# Patient Record
Sex: Female | Born: 1940 | Race: White | Hispanic: No | Marital: Married | Smoking: Never smoker
Health system: Southern US, Community
[De-identification: ages and names within clinical notes are randomized; demographics above are authoritative.]

## PROBLEM LIST (undated history)

## (undated) DIAGNOSIS — R32 Unspecified urinary incontinence: Secondary | ICD-10-CM

## (undated) DIAGNOSIS — E7800 Pure hypercholesterolemia, unspecified: Secondary | ICD-10-CM

## (undated) DIAGNOSIS — M949 Disorder of cartilage, unspecified: Secondary | ICD-10-CM

## (undated) DIAGNOSIS — M899 Disorder of bone, unspecified: Secondary | ICD-10-CM

## (undated) DIAGNOSIS — C4440 Unspecified malignant neoplasm of skin of scalp and neck: Secondary | ICD-10-CM

## (undated) DIAGNOSIS — R002 Palpitations: Secondary | ICD-10-CM

## (undated) DIAGNOSIS — E870 Hyperosmolality and hypernatremia: Secondary | ICD-10-CM

## (undated) DIAGNOSIS — Z79899 Other long term (current) drug therapy: Secondary | ICD-10-CM

## (undated) DIAGNOSIS — M67919 Unspecified disorder of synovium and tendon, unspecified shoulder: Secondary | ICD-10-CM

## (undated) DIAGNOSIS — M810 Age-related osteoporosis without current pathological fracture: Secondary | ICD-10-CM

## (undated) DIAGNOSIS — M25519 Pain in unspecified shoulder: Secondary | ICD-10-CM

## (undated) DIAGNOSIS — K2100 Gastro-esophageal reflux disease with esophagitis, without bleeding: Secondary | ICD-10-CM

## (undated) DIAGNOSIS — M719 Bursopathy, unspecified: Secondary | ICD-10-CM

## (undated) DIAGNOSIS — M79609 Pain in unspecified limb: Secondary | ICD-10-CM

## (undated) DIAGNOSIS — K219 Gastro-esophageal reflux disease without esophagitis: Secondary | ICD-10-CM

## (undated) DIAGNOSIS — R000 Tachycardia, unspecified: Secondary | ICD-10-CM

## (undated) DIAGNOSIS — I10 Essential (primary) hypertension: Secondary | ICD-10-CM

## (undated) DIAGNOSIS — E785 Hyperlipidemia, unspecified: Secondary | ICD-10-CM

## (undated) HISTORY — DX: Disorder of bone, unspecified: M89.9

## (undated) HISTORY — DX: Unspecified urinary incontinence: R32

## (undated) HISTORY — DX: Pure hypercholesterolemia, unspecified: E78.00

## (undated) HISTORY — DX: Gastro-esophageal reflux disease with esophagitis, without bleeding: K21.00

## (undated) HISTORY — DX: Essential (primary) hypertension: I10

## (undated) HISTORY — PX: VARICOSE VEIN SURGERY: SHX832

## (undated) HISTORY — DX: Unspecified malignant neoplasm of skin of scalp and neck: C44.40

## (undated) HISTORY — DX: Bursopathy, unspecified: M71.9

## (undated) HISTORY — DX: Gastro-esophageal reflux disease without esophagitis: K21.9

## (undated) HISTORY — DX: Pain in unspecified shoulder: M25.519

## (undated) HISTORY — DX: Gastro-esophageal reflux disease with esophagitis: K21.0

## (undated) HISTORY — PX: DIAGNOSTIC MAMMOGRAM: HXRAD719

## (undated) HISTORY — DX: Palpitations: R00.2

## (undated) HISTORY — DX: Hyperosmolality and hypernatremia: E87.0

## (undated) HISTORY — DX: Hyperlipidemia, unspecified: E78.5

## (undated) HISTORY — DX: Age-related osteoporosis without current pathological fracture: M81.0

## (undated) HISTORY — DX: Pain in unspecified limb: M79.609

## (undated) HISTORY — DX: Unspecified disorder of synovium and tendon, unspecified shoulder: M67.919

## (undated) HISTORY — DX: Other long term (current) drug therapy: Z79.899

## (undated) HISTORY — DX: Disorder of cartilage, unspecified: M94.9

## (undated) HISTORY — DX: Tachycardia, unspecified: R00.0

---
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
## 2002-09-12 HISTORY — PX: CHOLECYSTECTOMY: SHX55

## 2003-04-26 ENCOUNTER — Encounter: Payer: Self-pay | Admitting: General Surgery

## 2003-04-26 ENCOUNTER — Observation Stay (HOSPITAL_COMMUNITY): Admission: RE | Admit: 2003-04-26 | Discharge: 2003-04-27 | Payer: Self-pay | Admitting: General Surgery

## 2003-04-26 ENCOUNTER — Encounter (INDEPENDENT_AMBULATORY_CARE_PROVIDER_SITE_OTHER): Payer: Self-pay | Admitting: Specialist

## 2007-01-15 ENCOUNTER — Other Ambulatory Visit: Admission: RE | Admit: 2007-01-15 | Discharge: 2007-01-15 | Payer: Self-pay | Admitting: Gynecology

## 2008-09-12 HISTORY — PX: CATARACT EXTRACTION: SUR2

## 2009-03-31 HISTORY — PX: COLONOSCOPY: SHX174

## 2011-01-28 NOTE — Op Note (Signed)
NAME:  Brooke Cox, Brooke Cox                          ACCOUNT NO.:  [id number]   MEDICAL RECORD NO.:  [id number]                   PATIENT TYPE:  OBV   LOCATION:  0106                                 FACILITY:  [facility]   PHYSICIAN:  Edsel Petrin. Dalbert Batman, M.D.             DATE OF BIRTH:  01-25-1941   DATE OF PROCEDURE:  04/26/2003  DATE OF DISCHARGE:                                 OPERATIVE REPORT   PREOPERATIVE DIAGNOSIS:  Acute cholecystitis with cholelithiasis   POSTOPERATIVE DIAGNOSIS:  1. Acute cholecystitis with cholelithiasis.  2. Liver nodule.   OPERATION/PROCEDURE:  1. Wedge biopsy of liver.  2. Laparoscopic cholecystectomy with intraoperative cholangiogram.   SURGEON:  Edsel Petrin. Dalbert Batman, M.D.   FIRST ASSISTANT:  Timothy E. Rosana Hoes, M.D.   OPERATIVE INDICATIONS:  This is a 70 year old white female who has been  having upper abdominal pain, right upper quadrant pain, nausea and vomiting  for about four or five days.  She was seen in the office yesterday and was  found to still have right upper quadrant tenderness although she was not  severely ill.  Ultrasound shows thickening of the gallbladder and multiple  gallstones.  She is brought to the operating room for cholecystectomy.   OPERATIVE FINDINGS:  1. The gallbladder was acutely inflamed and thick-walled. The cholangiogram     was normal showing normal intrahepatic and extrahepatic bile ducts.  No     filling defects and prompt flow of contrast in the duodenum.  There was a     whitish superficial nodule on the surface of the right lobe of the liver     and I essentially excised most of this in a wedge biopsy-type form with     laparoscopic instruments and sent this for routine histology.  We had     excellent hemostasis there.  The stomach and duodenum, large intestine     and small intestine were grossly normal to inspection.   DESCRIPTION OF PROCEDURE:  Following the induction of general endotracheal  anesthesia, the  patient's abdomen was prepped and draped in the sterile  fashion.  Marcaine 0.5% with epinephrine was used as a local infiltration  anesthetic.  A vertically oriented incision was made at the lower rim of the  umbilicus.  The fascia was incised in the midline and the abdominal cavity  entered under direct vision.  A 10 mm Hasson trocar was inserted and secured  with a pursestring of 0 Vicryl.  Pneumoperitoneum was created.  Video camera  was inserted with visualization and findings as described above.  A 10 mm  trocar was placed in the subxiphoid region and two 5 mm trocars were placed  in the right upper quadrant.   We took a curved biopsy forceps and with a biting biopsy technique excised  the small 8 mm nodule from the capsule of the surface of the right lobe of  the liver superiorly.  We cauterized this and we had excellent hemostasis.   We then used the suction trocar and evacuated the bile from the gallbladder  because it was very tense.  This facilitated the dissection.  We dissected  out the cystic duct and the cystic artery.  We cannulated the cystic duct  with the cholangiogram catheter.  The cholangiogram was obtained using the C-  arm.  This was a normal cholangiogram.  There were no filling defects.  There was no obstruction.  The intrahepatic and extrahepatic anatomy was  normal.  The cholangiogram catheter was removed and the cystic duct secured  with multiple metal clips and divided.  Cystic artery was isolated as it  went into the gallbladder wall, secured with multiple metal clips and  divided. The gallbladder was dissected from its bed with electrocautery,  placed in the specimen bag and removed.   We had moderate bleeding during the case but with the use of electrocautery  and Surgicel, everything was completely dry at the end of the case.  We  inspected the cystic artery and cystic duct closures and they were perfectly  secure.  After irrigating two or three liters  during the case and at the  end, the irrigation fluid was completely clear.  The surface of the liver  was completely hemostatic.  The trocars were removed under direct vision.  There was no bleeding from the trocar sites.  The pneumoperitoneum was  released.  The fascia at the umbilicus was closed with 0 Vicryl suture.  Skin incisions were closed with subcuticular sutures of 4-0 Vicryl and Steri-  Strips.  Kling bandages were placed.  The patient was taken to the recovery  room in stable condition.  Estimated blood loss was about 30-40 mL.  Complications none.  Sponge and needle counts were correct.                                               Edsel Petrin. Dalbert Batman, M.D.    HMI/MEDQ  D:  04/26/2003  T:  04/26/2003  Job:  [id number]   cc:   Viviann Spare. Nyoka Cowden, M.D.  [address]  [place]  [place] [address]  Fax: [phone]   Ezequiel Kayser, M.D.  Internal Medicine Resident - [place]  [place] [address]  Fax: [phone]

## 2011-01-28 NOTE — H&P (Signed)
NAME:  KENNEDY, BRINES                          ACCOUNT NO.:  [id number]   MEDICAL RECORD NO.:  [id number]                   PATIENT TYPE:  OBV   LOCATION:  0106                                 FACILITY:  [facility]   PHYSICIAN:  Edsel Petrin. Dalbert Batman, M.D.             DATE OF BIRTH:  13-Jul-1941   DATE OF ADMISSION:  04/26/2003  DATE OF DISCHARGE:                                HISTORY & PHYSICAL   CHIEF COMPLAINT:  Abdominal pain and gallstones.   HISTORY OF PRESENT ILLNESS:  This is a 70 year old white female who has been  having upper abdominal pain, nausea and vomiting off and on for about five  days.  After her second or third episode, she sought evaluation.  She was  not having any fever, change in her bowel habits or voiding problems.  She  has not had any prior episodes.   On Thursday, two days ago, she saw Dr. Ezequiel Kayser.  Gallbladder  ultrasound was performed at [facility], which showed gallstones  with thickening of the gallbladder wall.  Lab work was done showing a  hemoglobin of 12.8, white count of 10,500, normal liver function tests.  She  also had a normal amylase and lipase.  I saw her in the office yesterday;  she was still having some pain and right upper quadrant tenderness, although  she was not severely ill.  We decided to go ahead with surgery today.   PAST HISTORY:  Appendectomy in 1955.  History of a tachyarrhythmia for which  she takes beta blockers, otherwise, no medical problems.   CURRENT MEDICATIONS:  Atenolol 25 mg daily.   DRUG ALLERGIES:  Drug allergies -- none.  She is allergic to [facility].   SOCIAL HISTORY:  The patient is married and she has no children.  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  Denies the use of alcohol or  tobacco.   FAMILY HISTORY:  Father is deceased, had heart disease and a pacemaker;  mother deceased, had congestive heart failure; one brother living with  diabetes.   REVIEW OF SYSTEMS:  All  systems are reviewed.  They are documented in our  records and are noncontributory except as described above.   PHYSICAL EXAMINATION:  GENERAL:  A pleasant, older, middle-aged woman in no  distress.  She is relatively thin, appears quite fit for her age.  VITAL SIGNS:  Vital signs are listed on the chart.  She is afebrile.  EYES:  Sclerae are clear.  Extraocular movements intact.  EARS, NOSE, MOUTH AND THROAT:  Nose, lips, tongue and oropharynx without  gross lesions.  NECK:  Neck supple and nontender.  No thyroid mass.  No adenopathy.  No  bruit.  No jugular venous distention.  LUNGS:  Lungs clear to auscultation.  No CVA tenderness.  HEART:  Regular rate and rhythm.  No murmur or clicks or rubs.  ABDOMEN:  Abdomen  soft, not distended.  Tender with a little bit of guarding  in the right upper quadrant but no mass.  The liver and spleen do not appear  enlarged.  No herniae noted.  LYMPHATICS:  No enlarged lymph node in the neck or groins.  EXTREMITIES:  She moves all four extremities well without any deformity or  edema.  Peripheral pulses are intact.  NEUROLOGIC:  No gross motor or sensory deficits.   ASSESSMENT:  Subacute cholecystitis with cholelithiasis.   PLAN:  1. We will go ahead and proceed with laparoscopic cholecystectomy today.  2. I have discussed the indications and details of surgery with her.  Risks     and complications have been outlined, including but not limited to     bleeding; infection; conversion to open laparotomy; injury to adjacent     organs with major reconstructive surgery; cardiac, pulmonary and     thromboembolic complications; and other unforeseen problems.  She seems     to understand these issues well.  At this time, all of her questions are     answered.  She is in complete agreement with this plan.                                                  Edsel Petrin. Dalbert Batman, M.D.    HMI/MEDQ  D:  04/26/2003  T:  04/26/2003  Job:  [id number]   cc:    Viviann Spare. Nyoka Cowden, M.D.  [address]  [place]  [place] [address]  Fax: [place]

## 2012-01-12 DIAGNOSIS — L570 Actinic keratosis: Secondary | ICD-10-CM | POA: Diagnosis not present

## 2012-01-12 DIAGNOSIS — D485 Neoplasm of uncertain behavior of skin: Secondary | ICD-10-CM | POA: Diagnosis not present

## 2012-06-28 DIAGNOSIS — M899 Disorder of bone, unspecified: Secondary | ICD-10-CM | POA: Diagnosis not present

## 2012-06-28 DIAGNOSIS — E785 Hyperlipidemia, unspecified: Secondary | ICD-10-CM | POA: Diagnosis not present

## 2012-06-28 DIAGNOSIS — I10 Essential (primary) hypertension: Secondary | ICD-10-CM | POA: Diagnosis not present

## 2012-06-28 DIAGNOSIS — R002 Palpitations: Secondary | ICD-10-CM | POA: Diagnosis not present

## 2012-07-03 DIAGNOSIS — H6120 Impacted cerumen, unspecified ear: Secondary | ICD-10-CM | POA: Diagnosis not present

## 2012-07-03 DIAGNOSIS — I10 Essential (primary) hypertension: Secondary | ICD-10-CM | POA: Diagnosis not present

## 2012-07-03 DIAGNOSIS — Z23 Encounter for immunization: Secondary | ICD-10-CM | POA: Diagnosis not present

## 2012-07-03 DIAGNOSIS — R002 Palpitations: Secondary | ICD-10-CM | POA: Diagnosis not present

## 2012-07-11 DIAGNOSIS — J019 Acute sinusitis, unspecified: Secondary | ICD-10-CM | POA: Diagnosis not present

## 2012-07-30 DIAGNOSIS — H1010 Acute atopic conjunctivitis, unspecified eye: Secondary | ICD-10-CM | POA: Diagnosis not present

## 2012-07-30 DIAGNOSIS — J019 Acute sinusitis, unspecified: Secondary | ICD-10-CM | POA: Diagnosis not present

## 2012-08-14 DIAGNOSIS — R920 Mammographic microcalcification found on diagnostic imaging of breast: Secondary | ICD-10-CM | POA: Diagnosis not present

## 2012-08-14 DIAGNOSIS — Z09 Encounter for follow-up examination after completed treatment for conditions other than malignant neoplasm: Secondary | ICD-10-CM | POA: Diagnosis not present

## 2012-08-14 DIAGNOSIS — N63 Unspecified lump in unspecified breast: Secondary | ICD-10-CM | POA: Diagnosis not present

## 2012-08-14 DIAGNOSIS — M949 Disorder of cartilage, unspecified: Secondary | ICD-10-CM | POA: Diagnosis not present

## 2012-08-14 HISTORY — PX: OTHER SURGICAL HISTORY: SHX169

## 2012-08-14 LAB — HM MAMMOGRAPHY: HM Mammogram: ABNORMAL

## 2012-08-20 ENCOUNTER — Other Ambulatory Visit: Payer: Self-pay

## 2012-08-20 DIAGNOSIS — N63 Unspecified lump in unspecified breast: Secondary | ICD-10-CM | POA: Diagnosis not present

## 2012-08-20 DIAGNOSIS — R928 Other abnormal and inconclusive findings on diagnostic imaging of breast: Secondary | ICD-10-CM | POA: Diagnosis not present

## 2012-08-20 DIAGNOSIS — D249 Benign neoplasm of unspecified breast: Secondary | ICD-10-CM | POA: Diagnosis not present

## 2013-07-02 ENCOUNTER — Encounter: Payer: Self-pay | Admitting: *Deleted

## 2013-07-02 ENCOUNTER — Other Ambulatory Visit: Payer: Self-pay | Admitting: *Deleted

## 2013-07-02 ENCOUNTER — Other Ambulatory Visit: Payer: Medicare Other

## 2013-07-02 DIAGNOSIS — E870 Hyperosmolality and hypernatremia: Secondary | ICD-10-CM

## 2013-07-02 DIAGNOSIS — I10 Essential (primary) hypertension: Secondary | ICD-10-CM

## 2013-07-02 DIAGNOSIS — E785 Hyperlipidemia, unspecified: Secondary | ICD-10-CM

## 2013-07-03 ENCOUNTER — Ambulatory Visit (INDEPENDENT_AMBULATORY_CARE_PROVIDER_SITE_OTHER): Payer: Medicare Other | Admitting: Internal Medicine

## 2013-07-03 ENCOUNTER — Encounter: Payer: Self-pay | Admitting: Internal Medicine

## 2013-07-03 VITALS — BP 122/74 | HR 78 | Temp 97.9°F | Resp 12 | Ht 59.0 in | Wt 156.0 lb

## 2013-07-03 DIAGNOSIS — H6120 Impacted cerumen, unspecified ear: Secondary | ICD-10-CM

## 2013-07-03 DIAGNOSIS — I119 Hypertensive heart disease without heart failure: Secondary | ICD-10-CM | POA: Diagnosis not present

## 2013-07-03 DIAGNOSIS — Z124 Encounter for screening for malignant neoplasm of cervix: Secondary | ICD-10-CM | POA: Diagnosis not present

## 2013-07-03 DIAGNOSIS — M810 Age-related osteoporosis without current pathological fracture: Secondary | ICD-10-CM | POA: Diagnosis not present

## 2013-07-03 DIAGNOSIS — K219 Gastro-esophageal reflux disease without esophagitis: Secondary | ICD-10-CM | POA: Diagnosis not present

## 2013-07-03 DIAGNOSIS — M79604 Pain in right leg: Secondary | ICD-10-CM

## 2013-07-03 DIAGNOSIS — Z23 Encounter for immunization: Secondary | ICD-10-CM

## 2013-07-03 DIAGNOSIS — M79609 Pain in unspecified limb: Secondary | ICD-10-CM | POA: Diagnosis not present

## 2013-07-03 DIAGNOSIS — H6122 Impacted cerumen, left ear: Secondary | ICD-10-CM

## 2013-07-03 DIAGNOSIS — E785 Hyperlipidemia, unspecified: Secondary | ICD-10-CM

## 2013-07-03 DIAGNOSIS — Z0000 Encounter for general adult medical examination without abnormal findings: Secondary | ICD-10-CM

## 2013-07-03 LAB — COMPREHENSIVE METABOLIC PANEL
ALT: 10 IU/L (ref 0–32)
AST: 16 IU/L (ref 0–40)
Alkaline Phosphatase: 78 IU/L (ref 39–117)
CO2: 26 mmol/L (ref 18–29)
Calcium: 10 mg/dL (ref 8.6–10.2)
Chloride: 101 mmol/L (ref 97–108)
Globulin, Total: 2.3 g/dL (ref 1.5–4.5)
Glucose: 81 mg/dL (ref 65–99)
Potassium: 4.6 mmol/L (ref 3.5–5.2)
Sodium: 142 mmol/L (ref 134–144)
Total Protein: 6.4 g/dL (ref 6.0–8.5)

## 2013-07-03 LAB — CBC WITH DIFFERENTIAL/PLATELET
Basos: 0 %
Eos: 3 %
Eosinophils Absolute: 0.2 10*3/uL (ref 0.0–0.4)
HCT: 36.6 % (ref 34.0–46.6)
Immature Grans (Abs): 0 10*3/uL (ref 0.0–0.1)
Lymphocytes Absolute: 1.4 10*3/uL (ref 0.7–3.1)
Lymphs: 24 %
MCH: 30.6 pg (ref 26.6–33.0)
MCV: 90 fL (ref 79–97)
Monocytes Absolute: 0.4 10*3/uL (ref 0.1–0.9)
Monocytes: 7 %
Neutrophils Relative %: 66 %
RDW: 13.8 % (ref 12.3–15.4)
WBC: 5.6 10*3/uL (ref 3.4–10.8)

## 2013-07-03 LAB — LIPID PANEL
Cholesterol, Total: 200 mg/dL — ABNORMAL HIGH (ref 100–199)
HDL: 58 mg/dL (ref >39)
LDL Calculated: 108 mg/dL — ABNORMAL HIGH (ref 0–99)
VLDL Cholesterol Cal: 34 mg/dL (ref 5–40)

## 2013-07-03 MED ORDER — SIMVASTATIN 40 MG PO TABS: ORAL_TABLET | ORAL | Status: DC

## 2013-07-03 MED ORDER — RALOXIFENE HCL 60 MG PO TABS: ORAL_TABLET | ORAL | Status: DC

## 2013-07-03 MED ORDER — ATENOLOL 25 MG PO TABS: ORAL_TABLET | ORAL | Status: DC

## 2013-07-03 MED ORDER — OMEPRAZOLE 10 MG PO CPDR: 10.0000 mg | DELAYED_RELEASE_CAPSULE | Freq: Every day | ORAL | Status: DC

## 2013-07-03 NOTE — Addendum Note (Signed)
Addended by: Logan Bores on: 07/03/2013 11:55 AM   Modules accepted: Orders

## 2013-07-03 NOTE — Progress Notes (Signed)
Patient ID: Brooke Cox, female   DOB: 15-May-1941, 72 y.o.   MRN: [id number]  Chief Complaint  Patient presents with  . Annual Exam    Yearly exam and discuss labs (copy printed)   . Immunizations    Flu Vaccine    Allergies  Allergen Reactions  . Shellfish Allergy     HPI She complaints of discomfort on right leg on lateral side of lower leg on and off for 6 months. It aggrevates with movement and she notices it after moving around for sometime and rest relieves it. She takes aspirin as needed and this helps her some. Never been a smoker. Denies skin bruise, discoloration No trauma reported to that area. No redness or swelling around the area. Denies any knee pain Colonoscopy in 2010 showed diverticulosis in sigmoid colon and next is due in 2015 Mammogram 08/14/12 has suspicious abnormality and she underwent biopsy in left breast and results were benign. Next due in 08/2013 Last pap smear in 2011 dexa scan 08/14/12 showed T score of -2.1 at right femoral neck and patient is on raloxiene and calcium-vitamin d supplement EKG 07/03/12 showed NSR with LAD and LVH. She has hypertension uptodate with pneumococcal and shingles vaccine  Review of Systems  Constitutional: Negative for fever, chills, weight loss, malaise/fatigue and diaphoresis.  HENT: Negative for congestion, ear discharge, ear pain, hearing loss, nosebleeds, sore throat and tinnitus.   Eyes: Negative for blurred vision, double vision, photophobia, discharge and redness.       Has upcoming appointment with eye docotr on 07/09/13. Has had cataract surgery  Respiratory: Negative for cough, hemoptysis, sputum production, shortness of breath, wheezing and stridor.   Cardiovascular: Negative for chest pain, palpitations, orthopnea, leg swelling and PND.  Gastrointestinal: Negative for heartburn, nausea, vomiting, abdominal pain, diarrhea, constipation, blood in stool and melena.  Genitourinary: Positive for frequency. Negative for  dysuria, urgency, hematuria and flank pain.       Nocturia- 2-3 times a night.  Musculoskeletal: Negative for back pain, falls, joint pain, myalgias and neck pain.  Skin: Negative for itching and rash.  Neurological: Negative for dizziness, tingling, tremors, focal weakness, seizures, loss of consciousness, weakness and headaches.  Endo/Heme/Allergies: Does not bruise/bleed easily.  Psychiatric/Behavioral: Negative for depression, suicidal ideas, hallucinations, memory loss and substance abuse. The patient is not nervous/anxious and does not have insomnia.     Past Medical History  Diagnosis Date  . Hyperosmolality and/or hypernatremia   . Unspecified essential hypertension   . Unspecified urinary incontinence   . Esophageal reflux   . Encounter for long-term (current) use of other medications   . Other and unspecified hyperlipidemia   . Reflux esophagitis   . Disorder of bone and cartilage, unspecified   . Palpitations   . Disorders of bursae and tendons in shoulder region, unspecified   . Pain in limb   . Pure hypercholesterolemia   . Pain in joint, shoulder region   . Tachycardia, unspecified   . Osteoporosis, unspecified    Past Surgical History  Procedure Laterality Date  . Colonoscopy  03/31/2009    Diverticulosis, Dr.John Amedeo Plenty   . Diagnostic mammogram    . Bone density  08/14/2012  . Cholecystectomy  2004  . Appendectomy  1955  . Cataract extraction Bilateral 2010   Current Outpatient Prescriptions on File Prior to Visit  Medication Sig Dispense Refill  . aspirin 81 MG tablet Take 81 mg by mouth daily.      Marland Kitchen atenolol (TENORMIN) 25 MG  tablet Take one tablet once daily for blood pressure      . Cholecalciferol (VITAMIN D) 2000 UNITS tablet Take one tablet once daily      . olopatadine (PATANOL) 0.1 % ophthalmic solution One drop each eye twice daily      . omeprazole (PRILOSEC) 10 MG capsule Take 10 mg by mouth daily.      . raloxifene (EVISTA) 60 MG tablet Take one  tablet daily to prevent osteoporosis      . simvastatin (ZOCOR) 40 MG tablet Take one tablet at bedtime for cholesterol       No current facility-administered medications on file prior to visit.   Physical exam  BP 122/74  Pulse 78  Temp(Src) 97.9 F (36.6 C) (Oral)  Resp 12  Ht 4' 11"  (1.499 m)  Wt 156 lb (70.761 kg)  BMI 31.49 kg/m2  SpO2 98%  General- elderly female in no acute distress Head- atraumatic, normocephalic Eyes- PERRLA, EOMI, no pallor, no icterus, no discharge Neck- no lymphadenopathy, no thyromegaly, no jugular vein distension, no carotid bruit Ears- left ear cerumen impacted, normal external ear canal , right ear normal tympanic membrane and normal external ear canal Chest- no chest wall deformities, no chest wall tenderness Breast- no masses, no palpable lumps, normal nipple and areola exam, no axillary lymphadenopathy Cardiovascular- normal s1,s2, no murmurs/ rubs/ gallops Respiratory- bilateral clear to auscultation, no wheeze, no rhonchi, no crackles Abdomen- bowel sounds present, soft, non tender, no organomegaly, no abdominal bruits, no guarding or rigidity, no CVA tenderness Pelvic exam- normal pelvic exam, vaginal atrophy, no adenexal tenderness Musculoskeletal- able to move all 4 extremities, no spinal and paraspinal tenderness, steady gait, no use of assistive device Neurological- no focal deficit, normal reflexes, normal muscle strength, normal sensation to fine touch and vibration Psychiatry- alert and oriented to person, place and time, normal mood and affect  Labs reviewed- CBC    Component Value Date/Time   WBC 5.6 07/02/2013 0956   RBC 4.08 07/02/2013 0956   HGB 12.5 07/02/2013 0956   HCT 36.6 07/02/2013 0956   MCV 90 07/02/2013 0956   MCH 30.6 07/02/2013 0956   MCHC 34.2 07/02/2013 0956   RDW 13.8 07/02/2013 0956   LYMPHSABS 1.4 07/02/2013 0956   EOSABS 0.2 07/02/2013 0956   BASOSABS 0.0 07/02/2013 0956    CMP     Component Value  Date/Time   NA 142 07/02/2013 0956   K 4.6 07/02/2013 0956   CL 101 07/02/2013 0956   CO2 26 07/02/2013 0956   GLUCOSE 81 07/02/2013 0956   BUN 13 07/02/2013 0956   CREATININE 0.65 07/02/2013 0956   CALCIUM 10.0 07/02/2013 0956   PROT 6.4 07/02/2013 0956   AST 16 07/02/2013 0956   ALT 10 07/02/2013 0956   ALKPHOS 78 07/02/2013 0956   BILITOT 0.3 07/02/2013 0956   GFRNONAA 88 07/02/2013 0956   GFRAA 101 07/02/2013 0956   Lipid Panel     Component Value Date/Time   TRIG 169* 07/02/2013 0956   HDL 58 07/02/2013 0956   CHOLHDL 3.4 07/02/2013 0956   LDLCALC 108* 07/02/2013 0956    Assessment/plan  Hypertensive heart disease- has controlled hypertension. Continue atenolol 25 mg daily for now and baby aspirin. Diet and exercise reinforced  Hyperlipidemia- reviewed lipid panel. Continue zocor for now, monitor flp. Dietary counselling, exercise counselling provided  oseoporosis- continue raloxifene and vitamin d supplement. Fall precautions. Weight bearing exercise  gerd- continue prilosec for now, symptoms under control  Impacted  cerumen- will get left ear lavage  Right leg pain- her varicose vein and arthritis could be contributing to this. Asked to take prn tylenol for now and use knee high ted hose during her daytime. Reassess if no improvement  General exam-Will provide her influenza vaccine. uptodate with colonoscopy in 2010 and next due 2015. uptodate with pneumococcal and shingles vaccine.

## 2013-07-08 ENCOUNTER — Other Ambulatory Visit: Payer: Self-pay | Admitting: *Deleted

## 2013-07-08 LAB — PAP IG (IMAGE GUIDED)

## 2013-07-08 MED ORDER — OMEPRAZOLE 10 MG PO CPDR: 10.0000 mg | DELAYED_RELEASE_CAPSULE | Freq: Every day | ORAL | Status: DC

## 2013-07-09 DIAGNOSIS — Z961 Presence of intraocular lens: Secondary | ICD-10-CM | POA: Diagnosis not present

## 2013-07-09 DIAGNOSIS — H43819 Vitreous degeneration, unspecified eye: Secondary | ICD-10-CM | POA: Diagnosis not present

## 2013-07-09 DIAGNOSIS — H04129 Dry eye syndrome of unspecified lacrimal gland: Secondary | ICD-10-CM | POA: Diagnosis not present

## 2013-07-09 DIAGNOSIS — H1045 Other chronic allergic conjunctivitis: Secondary | ICD-10-CM | POA: Diagnosis not present

## 2013-07-09 DIAGNOSIS — H26499 Other secondary cataract, unspecified eye: Secondary | ICD-10-CM | POA: Diagnosis not present

## 2013-07-15 DIAGNOSIS — H26499 Other secondary cataract, unspecified eye: Secondary | ICD-10-CM | POA: Diagnosis not present

## 2013-07-16 ENCOUNTER — Encounter: Payer: Self-pay | Admitting: Internal Medicine

## 2013-08-20 DIAGNOSIS — Z09 Encounter for follow-up examination after completed treatment for conditions other than malignant neoplasm: Secondary | ICD-10-CM | POA: Diagnosis not present

## 2013-08-20 DIAGNOSIS — D249 Benign neoplasm of unspecified breast: Secondary | ICD-10-CM | POA: Diagnosis not present

## 2013-09-09 ENCOUNTER — Encounter: Payer: Self-pay | Admitting: Internal Medicine

## 2013-10-01 ENCOUNTER — Telehealth: Payer: Self-pay | Admitting: *Deleted

## 2013-10-01 NOTE — Telephone Encounter (Signed)
rx for Omeprazole change to the 20 mg, due to pt able to get this for free through insurance or help from drug company.

## 2013-12-30 ENCOUNTER — Ambulatory Visit (INDEPENDENT_AMBULATORY_CARE_PROVIDER_SITE_OTHER): Payer: Medicare Other | Admitting: Internal Medicine

## 2013-12-30 ENCOUNTER — Encounter: Payer: Self-pay | Admitting: Internal Medicine

## 2013-12-30 VITALS — BP 128/82 | HR 77 | Temp 98.2°F | Wt 158.4 lb

## 2013-12-30 DIAGNOSIS — J0110 Acute frontal sinusitis, unspecified: Secondary | ICD-10-CM

## 2013-12-30 MED ORDER — AZITHROMYCIN 250 MG PO TABS: ORAL_TABLET | ORAL | Status: DC

## 2013-12-30 NOTE — Progress Notes (Signed)
Patient ID: Brooke Cox, female   DOB: 15-Aug-1941, 73 y.o.   MRN: [id number]   Location:  [facility]  Allergies  Allergen Reactions  . Shellfish Allergy     Chief Complaint  Patient presents with  . Acute Visit    c/o having a deep cough w/ green phelgm, achiness, diarrhea and sinus pressure  x  1 week since going on trip to [place] for 1 week    HPI: Patient is a 73 y.o. female with hypertension, GERD seen in the office today for acute visit with above.  Deep cough, sinus pressure.  No fevers, chills.  No sick contacts known.  Traveled to [place].  Has taken tylenol only so far.  Was having body aches.  Wants to be sure she doesn't have pneumonia.    Review of Systems:  Review of Systems  Constitutional: Positive for malaise/fatigue. Negative for fever and chills.  HENT: Positive for congestion and sore throat. Negative for ear pain.   Eyes: Negative for blurred vision.  Respiratory: Positive for cough and sputum production. Negative for shortness of breath and wheezing.   Cardiovascular: Negative for chest pain and leg swelling.  Gastrointestinal: Positive for diarrhea.  Genitourinary: Negative for dysuria.  Musculoskeletal: Positive for myalgias.  Neurological: Positive for weakness and headaches.    Past Medical History  Diagnosis Date  . Hyperosmolality and/or hypernatremia   . Unspecified essential hypertension   . Unspecified urinary incontinence   . Esophageal reflux   . Encounter for long-term (current) use of other medications   . Other and unspecified hyperlipidemia   . Reflux esophagitis   . Disorder of bone and cartilage, unspecified   . Palpitations   . Disorders of bursae and tendons in shoulder region, unspecified   . Pain in limb   . Pure hypercholesterolemia   . Pain in joint, shoulder region   . Tachycardia, unspecified   . Osteoporosis, unspecified     Past Surgical History  Procedure Laterality Date  .  Colonoscopy  03/31/2009    Diverticulosis, Dr.John Amedeo Plenty   . Diagnostic mammogram    . Bone density  08/14/2012  . Cholecystectomy  2004  . Appendectomy  1955  . Cataract extraction Bilateral 2010    Social History:   reports that she has never smoked. She does not have any smokeless tobacco history on file. She reports that she does not drink alcohol or use illicit drugs.  History reviewed. No pertinent family history.  Medications: Patient's Medications  New Prescriptions   No medications on file  Previous Medications   ASPIRIN 81 MG TABLET    Take 81 mg by mouth daily.   ATENOLOL (TENORMIN) 25 MG TABLET    Take one tablet once daily for blood pressure   CHOLECALCIFEROL (VITAMIN D) 2000 UNITS TABLET    Take one tablet once daily   OLOPATADINE (PATANOL) 0.1 % OPHTHALMIC SOLUTION    One drop each eye twice daily   OMEPRAZOLE (PRILOSEC) 20 MG CAPSULE    Take 20 mg by mouth daily.   RALOXIFENE (EVISTA) 60 MG TABLET    Take one tablet daily to prevent osteoporosis   SIMVASTATIN (ZOCOR) 40 MG TABLET    Take one tablet at bedtime for cholesterol  Modified Medications   No medications on file  Discontinued Medications   No medications on file     Physical Exam: Filed Vitals:   12/30/13 1121  BP: 128/82  Pulse: 77  Temp:  98.2 F (36.8 C)  TempSrc: Oral  Weight: 158 lb 6.4 oz (71.85 kg)  SpO2: 97%  Physical Exam  Constitutional: She appears well-developed and well-nourished. No distress.  HENT:  Erythema and white drainage (PND) present, no lymphadenopathy  Cardiovascular: Normal rate, regular rhythm, normal heart sounds and intact distal pulses.   Pulmonary/Chest: Effort normal and breath sounds normal. No respiratory distress.  Abdominal: Soft. Bowel sounds are normal. She exhibits no distension. There is no tenderness.  Skin: Skin is warm and dry.  Psychiatric: She has a normal mood and affect.    Labs reviewed: Basic Metabolic Panel:  Recent Labs  07/02/13 0956    NA 142  K 4.6  CL 101  CO2 26  GLUCOSE 81  BUN 13  CREATININE 0.65  CALCIUM 10.0   Liver Function Tests:  Recent Labs  07/02/13 0956  AST 16  ALT 10  ALKPHOS 78  BILITOT 0.3  PROT 6.4  CBC:  Recent Labs  07/02/13 0956  WBC 5.6  NEUTROABS 3.7  HGB 12.5  HCT 36.6  MCV 90   Lipid Panel:  Recent Labs  07/02/13 0956  HDL 58  LDLCALC 108*  TRIG 169*  CHOLHDL 3.4   Assessment/Plan 1. Acute frontal sinusitis - encouraged conservative mgt with fluids, sinus flushing, rest, adequate po intake - azithromycin (ZITHROMAX) 250 MG tablet; 2 tablets today, 1 tablet daily for 4 days for sinus infection  Dispense: 6 tablet; Refill: 0 -seems she is improving, but I believe she suppressed her fever with her tylenol use  -eat yogurt daily while taking zpak  Next appt:  Keep with Dr. Bubba Camp

## 2013-12-30 NOTE — Patient Instructions (Signed)

## 2014-04-05 ENCOUNTER — Other Ambulatory Visit: Payer: Self-pay | Admitting: Internal Medicine

## 2014-05-31 ENCOUNTER — Encounter (HOSPITAL_COMMUNITY): Payer: Self-pay | Admitting: Emergency Medicine

## 2014-05-31 ENCOUNTER — Emergency Department (HOSPITAL_COMMUNITY)
Admission: EM | Admit: 2014-05-31 | Discharge: 2014-05-31 | Disposition: A | Discharge status: Home / Self Care | Payer: Medicare Other | Attending: Emergency Medicine | Admitting: Emergency Medicine

## 2014-05-31 DIAGNOSIS — R079 Chest pain, unspecified: Secondary | ICD-10-CM | POA: Diagnosis not present

## 2014-05-31 DIAGNOSIS — M899 Disorder of bone, unspecified: Secondary | ICD-10-CM | POA: Diagnosis not present

## 2014-05-31 DIAGNOSIS — Z7982 Long term (current) use of aspirin: Secondary | ICD-10-CM | POA: Diagnosis not present

## 2014-05-31 DIAGNOSIS — K219 Gastro-esophageal reflux disease without esophagitis: Secondary | ICD-10-CM | POA: Diagnosis not present

## 2014-05-31 DIAGNOSIS — M810 Age-related osteoporosis without current pathological fracture: Secondary | ICD-10-CM | POA: Insufficient documentation

## 2014-05-31 DIAGNOSIS — I447 Left bundle-branch block, unspecified: Secondary | ICD-10-CM | POA: Diagnosis not present

## 2014-05-31 DIAGNOSIS — IMO0002 Reserved for concepts with insufficient information to code with codable children: Secondary | ICD-10-CM | POA: Insufficient documentation

## 2014-05-31 DIAGNOSIS — Z79899 Other long term (current) drug therapy: Secondary | ICD-10-CM | POA: Insufficient documentation

## 2014-05-31 DIAGNOSIS — M949 Disorder of cartilage, unspecified: Secondary | ICD-10-CM | POA: Diagnosis not present

## 2014-05-31 DIAGNOSIS — M542 Cervicalgia: Secondary | ICD-10-CM | POA: Diagnosis not present

## 2014-05-31 DIAGNOSIS — E785 Hyperlipidemia, unspecified: Secondary | ICD-10-CM | POA: Diagnosis not present

## 2014-05-31 DIAGNOSIS — E7800 Pure hypercholesterolemia, unspecified: Secondary | ICD-10-CM | POA: Insufficient documentation

## 2014-05-31 DIAGNOSIS — I10 Essential (primary) hypertension: Secondary | ICD-10-CM | POA: Insufficient documentation

## 2014-05-31 LAB — COMPREHENSIVE METABOLIC PANEL
ALK PHOS: 79 U/L (ref 39–117)
ALT: 12 U/L (ref 0–35)
AST: 17 U/L (ref 0–37)
Albumin: 3.6 g/dL (ref 3.5–5.2)
Anion gap: 13 (ref 5–15)
BUN: 14 mg/dL (ref 6–23)
CHLORIDE: 102 meq/L (ref 96–112)
CO2: 26 meq/L (ref 19–32)
Calcium: 9.8 mg/dL (ref 8.4–10.5)
Creatinine, Ser: 0.66 mg/dL (ref 0.50–1.10)
GFR calc Af Amer: >90 mL/min (ref >90)
GFR, EST NON AFRICAN AMERICAN: 84 mL/min — AB (ref >90)
Glucose, Bld: 87 mg/dL (ref 70–99)
POTASSIUM: 4 meq/L (ref 3.7–5.3)
SODIUM: 141 meq/L (ref 137–147)
Total Bilirubin: 0.3 mg/dL (ref 0.3–1.2)
Total Protein: 7 g/dL (ref 6.0–8.3)

## 2014-05-31 LAB — CBC
HCT: 35.3 % — ABNORMAL LOW (ref 36.0–46.0)
Hemoglobin: 12.1 g/dL (ref 12.0–15.0)
MCH: 30.6 pg (ref 26.0–34.0)
MCHC: 34.3 g/dL (ref 30.0–36.0)
MCV: 89.1 fL (ref 78.0–100.0)
Platelets: 303 10*3/uL (ref 150–400)
RBC: 3.96 MIL/uL (ref 3.87–5.11)
RDW: 12.9 % (ref 11.5–15.5)
WBC: 6.7 10*3/uL (ref 4.0–10.5)

## 2014-05-31 LAB — I-STAT TROPONIN, ED: TROPONIN I, POC: 0 ng/mL (ref 0.00–0.08)

## 2014-05-31 NOTE — Discharge Instructions (Signed)
You were seen in the emergency department for left sided neck pain and your EKG showed changes from your prior EKG in 2004 (intraventricular conduction delay) but I do not think that this is the cause of your pain today and I think that this was an incidental finding. Your labs today were normal and there is no sign of damage to your heart in your blood work. I recommend you followup with her primary care physician who may refer you to a cardiologist for further evaluation. If you develop chest pain, shortness of breath, palpitations, begin suddenly sweating, feeling you're going to pass out, please return to the hospital.   Musculoskeletal Pain Musculoskeletal pain is muscle and boney aches and pains. These pains can occur in any part of the body. Your caregiver may treat you without knowing the cause of the pain. They may treat you if blood or urine tests, X-rays, and other tests were normal.  CAUSES There is often not a definite cause or reason for these pains. These pains may be caused by a type of germ (virus). The discomfort may also come from overuse. Overuse includes working out too hard when your body is not fit. Boney aches also come from weather changes. Bone is sensitive to atmospheric pressure changes. HOME CARE INSTRUCTIONS   Ask when your test results will be ready. Make sure you get your test results.  Only take over-the-counter or prescription medicines for pain, discomfort, or fever as directed by your caregiver. If you were given medications for your condition, do not drive, operate machinery or power tools, or sign legal documents for 24 hours. Do not drink alcohol. Do not take sleeping pills or other medications that may interfere with treatment.  Continue all activities unless the activities cause more pain. When the pain lessens, slowly resume normal activities. Gradually increase the intensity and duration of the activities or exercise.  During periods of severe pain, bed rest  may be helpful. Lay or sit in any position that is comfortable.  Putting ice on the injured area.  Put ice in a bag.  Place a towel between your skin and the bag.  Leave the ice on for 15 to 20 minutes, 3 to 4 times a day.  Follow up with your caregiver for continued problems and no reason can be found for the pain. If the pain becomes worse or does not go away, it may be necessary to repeat tests or do additional testing. Your caregiver may need to look further for a possible cause. SEEK IMMEDIATE MEDICAL CARE IF:  You have pain that is getting worse and is not relieved by medications.  You develop chest pain that is associated with shortness or breath, sweating, feeling sick to your stomach (nauseous), or throw up (vomit).  Your pain becomes localized to the abdomen.  You develop any new symptoms that seem different or that concern you. MAKE SURE YOU:   Understand these instructions.  Will watch your condition.  Will get help right away if you are not doing well or get worse. Document Released: 08/29/2005 Document Revised: 11/21/2011 Document Reviewed: 05/03/2013 [facility] Patient Information 2015 [place], [place]. This information is not intended to replace advice given to you by your health care provider. Make sure you discuss any questions you have with your health care provider.

## 2014-05-31 NOTE — ED Notes (Signed)
Pt discharged home with all belongings, pt alert, oriented and ambulatory upon discharge. No new RX prescribed, pt verbalizes understanding of discharge instructions. Pt driven home by spouse

## 2014-05-31 NOTE — ED Notes (Signed)
Pt presents with non radiating Left lateral neck pain toward the base of the neck starting 1100 this am. Pt denies chest pain, nausea, vomiting, abd/back/arm pain, dizziness, weakness, feeling lightheaded, diaphoretic or SOB. Pt seen at [facility] today and sent here for further evaluation.

## 2014-05-31 NOTE — ED Provider Notes (Signed)
TIME SEEN: 5:15 PM  CHIEF COMPLAINT: Left neck pain  HPI: Patient is a 73 y.o. F with history of hypertension, hyperlipidemia who presents to the emergency department from urgent care with left-sided neck pain. She states that at 11 AM she felt like she was stung by an insect in her left neck. She had a sharp stinging pain to the left lateral neck that lasted for one second and then resolve. It has occurred intermittently throughout the day. She went to urgent care to be evaluated. There they performed an EKG that showed questionable left bundle branch block versus intraventricular conduction delay and sent her to the emergency department. She denies any chest pain or chest discomfort. No shortness of breath. No nausea, vomiting or diarrhea. No diaphoresis or lightheadedness. No midline spinal tenderness. No history of neck injury. No numbness or focal weakness. No bowel or bladder incontinence. No headache. She does not remember any injury to the neck or any insect biting her.  ROS: See HPI Constitutional: no fever  Eyes: no drainage  ENT: no runny nose   Cardiovascular:  no chest pain  Resp: no SOB  GI: no vomiting GU: no dysuria Integumentary: no rash  Allergy: no hives  Musculoskeletal: no leg swelling  Neurological: no slurred speech ROS otherwise negative  PAST MEDICAL HISTORY/PAST SURGICAL HISTORY:  Past Medical History  Diagnosis Date  . Hyperosmolality and/or hypernatremia   . Unspecified essential hypertension   . Unspecified urinary incontinence   . Esophageal reflux   . Encounter for long-term (current) use of other medications   . Other and unspecified hyperlipidemia   . Reflux esophagitis   . Disorder of bone and cartilage, unspecified   . Palpitations   . Disorders of bursae and tendons in shoulder region, unspecified   . Pain in limb   . Pure hypercholesterolemia   . Pain in joint, shoulder region   . Tachycardia, unspecified   . Osteoporosis, unspecified      MEDICATIONS:  Prior to Admission medications   Medication Sig Start Date End Date Taking? Authorizing Provider  acetaminophen (TYLENOL) 325 MG tablet Take 325 mg by mouth every 6 (six) hours as needed for moderate pain.   Yes Historical Provider, MD  aspirin 325 MG EC tablet Take 325 mg by mouth every 6 (six) hours as needed for pain.   Yes Historical Provider, MD  aspirin 81 MG tablet Take 81 mg by mouth daily.   Yes Historical Provider, MD  atenolol (TENORMIN) 25 MG tablet Take 25 mg by mouth at bedtime.   Yes Historical Provider, MD  Cholecalciferol (VITAMIN D) 2000 UNITS tablet Take 2,000 Units by mouth 2 (two) times daily. Take one tablet once daily   Yes Historical Provider, MD  omeprazole (PRILOSEC) 20 MG capsule Take 20 mg by mouth daily.   Yes Historical Provider, MD  simvastatin (ZOCOR) 40 MG tablet Take 40 mg by mouth daily at 6 PM.   Yes Historical Provider, MD    ALLERGIES:  Allergies  Allergen Reactions  . Shellfish Allergy Nausea And Vomiting    SOCIAL HISTORY:  History  Substance Use Topics  . Smoking status: Never Smoker   . Smokeless tobacco: Not on file  . Alcohol Use: No    FAMILY HISTORY: No family history on file.  EXAM: BP 137/71  Pulse 83  Temp(Src) 98.1 F (36.7 C) (Oral)  Resp 20  Ht 4' 11"  (1.499 m)  Wt 158 lb (71.668 kg)  BMI 31.89 kg/m2  SpO2  99% CONSTITUTIONAL: Alert and oriented and responds appropriately to questions. Well-appearing; well-nourished, smiling, pleasant, in no distress, no pain HEAD: Normocephalic EYES: Conjunctivae clear, PERRL ENT: normal nose; no rhinorrhea; moist mucous membranes; pharynx without lesions noted NECK: Supple, no meningismus, no LAD; no JVD, no carotid bruit appreciated, no lesions noted on the side of her neck, no thyromegaly CARD: RRR; S1 and S2 appreciated; no murmurs, no clicks, no rubs, no gallops RESP: Normal chest excursion without splinting or tachypnea; breath sounds clear and equal  bilaterally; no wheezes, no rhonchi, no rales, no hypoxia or respiratory distress ABD/GI: Normal bowel sounds; non-distended; soft, non-tender, no rebound, no guarding BACK:  The back appears normal and is non-tender to palpation, there is no CVA tenderness EXT: Normal ROM in all joints; non-tender to palpation; no edema; normal capillary refill; no cyanosis    SKIN: Normal color for age and race; warm NEURO: Moves all extremities equally, strength 5/5 in all 4 extremities, sensation to light touch intact diffusely, cranial nerves II through XII intact, 2+ deep tendon reflexes in bilateral upper and lower extremities, normal gait PSYCH: The patient's mood and manner are appropriate. Grooming and personal hygiene are appropriate.  MEDICAL DECISION MAKING: Patient here with left-sided neck pain that she describes as sharp it never lasts for more than one second without any associated symptoms. EKG performed at urgent care prompted them to send her to the hospital for further evaluation. Here she has an EKG that shows intraventricular conduction delay but no other ischemic changes. Her last EKG was normal in 2004. Discussed with patient and husband at length that I do not feel that this pain today as her interval equivalent. She is currently pain-free. She has no other associated symptoms. I do recommend she follows up with her primary care physician who may refer her to a cardiologist given her EKG is abnormal but have discussed with patient that this could have occurred any time over the past 11 years. I do not think that her EKG changed acutely today. We'll check basic labs including a troponin. Anticipate that she will be discharged home.  ED PROGRESS: Labs unremarkable. Troponin negative. Patient is still pain-free and hemodynamically stable. We'll discharge home. Unclear etiology for pain but again I do not think this is her anginal equivalent, I do not think she has discitis, transverse myelitis,  cervical myelopathy, spinal stenosis, epidural abscess or hematoma, carotid dissection. Have discussed return precautions. Family verbalize understanding and is comfortable plan.    EKG Interpretation  Date/Time:  Saturday May 31 2014 17:10:16 EDT Ventricular Rate:  82 PR Interval:  150 QRS Duration: 132 QT Interval:  394 QTC Calculation: 460 R Axis:   41 Text Interpretation:  Normal sinus rhythm Non-specific intra-ventricular conduction block Cannot rule out Anterior infarct , age undetermined Abnormal ECG Confirmed by Quavis Klutz,  DO, Marquia Costello [phone]) on 05/31/2014 5:31:53 PM        [place], DO 05/31/14 1932

## 2014-05-31 NOTE — ED Notes (Signed)
Pt to ED from [facility]- pt woke up this morning and felt a stinging pain to left side of neck, denies radiation of pain, denies CP or shortness of breath.  EKG at urgent care showed new LBBB- pt was sent here for further evaluation.

## 2014-06-30 ENCOUNTER — Other Ambulatory Visit: Payer: Medicare Other

## 2014-06-30 DIAGNOSIS — E785 Hyperlipidemia, unspecified: Secondary | ICD-10-CM | POA: Diagnosis not present

## 2014-06-30 DIAGNOSIS — I119 Hypertensive heart disease without heart failure: Secondary | ICD-10-CM | POA: Diagnosis not present

## 2014-06-30 DIAGNOSIS — M810 Age-related osteoporosis without current pathological fracture: Secondary | ICD-10-CM | POA: Diagnosis not present

## 2014-07-01 LAB — CBC WITH DIFFERENTIAL/PLATELET
BASOS: 0 %
Basophils Absolute: 0 10*3/uL (ref 0.0–0.2)
EOS ABS: 0.1 10*3/uL (ref 0.0–0.4)
Eos: 2 %
HCT: 34.7 % (ref 34.0–46.6)
HEMOGLOBIN: 11.6 g/dL (ref 11.1–15.9)
Immature Grans (Abs): 0.1 10*3/uL (ref 0.0–0.1)
Immature Granulocytes: 2 %
Lymphocytes Absolute: 1.7 10*3/uL (ref 0.7–3.1)
Lymphs: 23 %
MCH: 30.2 pg (ref 26.6–33.0)
MCHC: 33.4 g/dL (ref 31.5–35.7)
MCV: 90 fL (ref 79–97)
MONOS ABS: 0.4 10*3/uL (ref 0.1–0.9)
Monocytes: 5 %
NEUTROS ABS: 5 10*3/uL (ref 1.4–7.0)
NEUTROS PCT: 68 %
RBC: 3.84 x10E6/uL (ref 3.77–5.28)
RDW: 13.2 % (ref 12.3–15.4)
WBC: 7.4 10*3/uL (ref 3.4–10.8)

## 2014-07-01 LAB — COMPREHENSIVE METABOLIC PANEL
ALT: 24 IU/L (ref 0–32)
AST: 16 IU/L (ref 0–40)
Albumin/Globulin Ratio: 1.4 (ref 1.1–2.5)
Albumin: 3.7 g/dL (ref 3.5–4.8)
Alkaline Phosphatase: 108 IU/L (ref 39–117)
BUN/Creatinine Ratio: 23 (ref 11–26)
BUN: 14 mg/dL (ref 8–27)
CO2: 27 mmol/L (ref 18–29)
Calcium: 10.2 mg/dL (ref 8.7–10.3)
Chloride: 101 mmol/L (ref 97–108)
Creatinine, Ser: 0.61 mg/dL (ref 0.57–1.00)
GFR, EST AFRICAN AMERICAN: 103 mL/min/{1.73_m2} (ref >59)
GFR, EST NON AFRICAN AMERICAN: 89 mL/min/{1.73_m2} (ref >59)
Globulin, Total: 2.7 g/dL (ref 1.5–4.5)
Glucose: 82 mg/dL (ref 65–99)
Potassium: 4.5 mmol/L (ref 3.5–5.2)
Sodium: 142 mmol/L (ref 134–144)
Total Bilirubin: <0.2 mg/dL (ref 0.0–1.2)
Total Protein: 6.4 g/dL (ref 6.0–8.5)

## 2014-07-01 LAB — LIPID PANEL
Chol/HDL Ratio: 4.5 ratio units — ABNORMAL HIGH (ref 0.0–4.4)
Cholesterol, Total: 172 mg/dL (ref 100–199)
HDL: 38 mg/dL — ABNORMAL LOW (ref >39)
LDL Calculated: 96 mg/dL (ref 0–99)
Triglycerides: 190 mg/dL — ABNORMAL HIGH (ref 0–149)
VLDL Cholesterol Cal: 38 mg/dL (ref 5–40)

## 2014-07-02 ENCOUNTER — Encounter: Payer: Medicare Other | Admitting: Internal Medicine

## 2014-07-04 ENCOUNTER — Other Ambulatory Visit: Payer: Medicare Other

## 2014-07-07 DIAGNOSIS — J029 Acute pharyngitis, unspecified: Secondary | ICD-10-CM | POA: Diagnosis not present

## 2014-07-08 ENCOUNTER — Encounter: Payer: Medicare Other | Admitting: Internal Medicine

## 2014-07-23 ENCOUNTER — Ambulatory Visit (INDEPENDENT_AMBULATORY_CARE_PROVIDER_SITE_OTHER): Payer: Medicare Other | Admitting: Internal Medicine

## 2014-07-23 ENCOUNTER — Encounter: Payer: Self-pay | Admitting: Internal Medicine

## 2014-07-23 VITALS — BP 132/70 | HR 67 | Temp 97.5°F | Resp 10 | Ht 59.0 in | Wt 157.0 lb

## 2014-07-23 DIAGNOSIS — I454 Nonspecific intraventricular block: Secondary | ICD-10-CM | POA: Insufficient documentation

## 2014-07-23 DIAGNOSIS — M8580 Other specified disorders of bone density and structure, unspecified site: Secondary | ICD-10-CM | POA: Insufficient documentation

## 2014-07-23 DIAGNOSIS — K219 Gastro-esophageal reflux disease without esophagitis: Secondary | ICD-10-CM

## 2014-07-23 DIAGNOSIS — Z23 Encounter for immunization: Secondary | ICD-10-CM | POA: Diagnosis not present

## 2014-07-23 DIAGNOSIS — Z0000 Encounter for general adult medical examination without abnormal findings: Secondary | ICD-10-CM | POA: Diagnosis not present

## 2014-07-23 DIAGNOSIS — I119 Hypertensive heart disease without heart failure: Secondary | ICD-10-CM

## 2014-07-23 DIAGNOSIS — K5730 Diverticulosis of large intestine without perforation or abscess without bleeding: Secondary | ICD-10-CM

## 2014-07-23 DIAGNOSIS — E785 Hyperlipidemia, unspecified: Secondary | ICD-10-CM

## 2014-07-23 DIAGNOSIS — I459 Conduction disorder, unspecified: Secondary | ICD-10-CM | POA: Insufficient documentation

## 2014-07-23 MED ORDER — OMEPRAZOLE 20 MG PO CPDR: 20.0000 mg | DELAYED_RELEASE_CAPSULE | Freq: Every day | ORAL | Status: DC

## 2014-07-23 MED ORDER — ATENOLOL 25 MG PO TABS
25.0000 mg | ORAL_TABLET | Freq: Every day | ORAL | Status: DC
Start: 2014-07-23 — End: 2015-07-30

## 2014-07-23 MED ORDER — PNEUMOCOCCAL 13-VAL CONJ VACC IM SUSP: 0.5000 mL | Freq: Once | INTRAMUSCULAR | Status: DC

## 2014-07-23 MED ORDER — SIMVASTATIN 40 MG PO TABS: 40.0000 mg | ORAL_TABLET | Freq: Every day | ORAL | Status: DC

## 2014-07-23 MED ORDER — PNEUMOCOCCAL 13-VAL CONJ VACC IM SUSP: 0.5000 mL | INTRAMUSCULAR | Status: DC

## 2014-07-23 NOTE — Progress Notes (Signed)
Passed clock drawing

## 2014-07-23 NOTE — Progress Notes (Signed)
Patient ID: Brooke Cox, female   DOB: 1941/05/20, 73 y.o.   MRN: [id number]    Allergies  Allergen Reactions  . Shellfish Allergy Nausea And Vomiting    Chief Complaint  Patient presents with  . Annual Exam    Yearly wellness exam, discuss labs completed on 06/30/14, EKG completed on 06/01/14. No pap  . MMSE    30/30, passed clock drawing      HPI:  73 y/o female patient is here for annual exam. She denies any concerns today. She has hx of HTN, GERD, osteopenia. She was in the ED in 9/201 with neck pain and new ekg changes showing intraventricular blockade. She denies any neck or chest pain at present.  Colonoscopy in 2010 showed diverticulosis in sigmoid colon and next is due this year. Pt does not want it and agrees to FOBT.  Mammogram due in 08/2014 Does not want further pelvic exam  dexa scan 08/14/12 showed T score of -2.1 at right femoral neck. Pt is now off medications   uptodate with pneumococcal 23 and influenza vaccine  Review of Systems  Constitutional: Negative for fever, chills, weight loss, malaise/fatigue and diaphoresis.  HENT: Negative for congestion, ear discharge, ear pain, hearing loss, nosebleeds, sore throat and tinnitus.   Eyes: Negative for blurred vision, double vision, photophobia, discharge and redness.        Has upcoming appointment with eye docotr on 12/15.  Respiratory: Negative for cough, hemoptysis, sputum production, shortness of breath, wheezing and stridor.   Cardiovascular: Negative for chest pain, palpitations, orthopnea, leg swelling and PND.  Gastrointestinal: Negative for heartburn, nausea, vomiting, abdominal pain, diarrhea, constipation, blood in stool and melena.  Genitourinary: Negative for dysuria, urgency, hematuria and flank pain.   Musculoskeletal: Negative for back pain, falls, joint pain, myalgias and neck pain.  Skin: Negative for itching and rash.  Neurological: Negative for dizziness, tingling, tremors, focal weakness,  seizures, loss of consciousness, weakness and headaches.  Endo/Heme/Allergies: Does not bruise/bleed easily.  Psychiatric/Behavioral: Negative for depression, suicidal ideas, hallucinations, memory loss and substance abuse. The patient is not nervous/anxious and does not have insomnia.     Past Medical History  Diagnosis Date  . Hyperosmolality and/or hypernatremia   . Unspecified essential hypertension   . Unspecified urinary incontinence   . Esophageal reflux   . Encounter for long-term (current) use of other medications   . Other and unspecified hyperlipidemia   . Reflux esophagitis   . Disorder of bone and cartilage, unspecified   . Palpitations   . Disorders of bursae and tendons in shoulder region, unspecified   . Pain in limb   . Pure hypercholesterolemia   . Pain in joint, shoulder region   . Tachycardia, unspecified   . Osteoporosis, unspecified    Past Surgical History  Procedure Laterality Date  . Colonoscopy  03/31/2009    Diverticulosis, Dr.John Amedeo Plenty   . Diagnostic mammogram    . Bone density  08/14/2012  . Cholecystectomy  2004  . Appendectomy  1955  . Cataract extraction Bilateral 2010   Social History:   reports that she has never smoked. She does not have any smokeless tobacco history on file. She reports that she does not drink alcohol or use illicit drugs.  History reviewed. No pertinent family history.  Medications: Patient's Medications  New Prescriptions   No medications on file  Previous Medications   ACETAMINOPHEN (TYLENOL) 325 MG TABLET    Take 325 mg by mouth every 6 (six) hours as  needed for moderate pain.   ASPIRIN 81 MG TABLET    Take 81 mg by mouth daily.   CHOLECALCIFEROL (VITAMIN D) 2000 UNITS TABLET    Take 2,000 Units by mouth 2 (two) times daily. Take one tablet once daily  Modified Medications   Modified Medication Previous Medication   ATENOLOL (TENORMIN) 25 MG TABLET atenolol (TENORMIN) 25 MG tablet      Take 1 tablet (25 mg  total) by mouth at bedtime.    Take 25 mg by mouth at bedtime.   OMEPRAZOLE (PRILOSEC) 20 MG CAPSULE omeprazole (PRILOSEC) 20 MG capsule      Take 1 capsule (20 mg total) by mouth daily.    Take 20 mg by mouth daily.   SIMVASTATIN (ZOCOR) 40 MG TABLET simvastatin (ZOCOR) 40 MG tablet      Take 1 tablet (40 mg total) by mouth daily at 6 PM.    Take 40 mg by mouth daily at 6 PM.  Discontinued Medications   ASPIRIN 325 MG EC TABLET    Take 325 mg by mouth every 6 (six) hours as needed for pain.     Physical Exam: Filed Vitals:   07/23/14 1135  BP: 132/70  Pulse: 67  Temp: 97.5 F (36.4 C)  TempSrc: Oral  Resp: 10  Height: 4' 11"  (1.499 m)  Weight: 157 lb (71.215 kg)  SpO2: 91%    General- elderly female in no acute distress Head- atraumatic, normocephalic Eyes- PERRLA, EOMI, no pallor, no icterus, no discharge Neck- no lymphadenopathy, no thyromegaly, no jugular vein distension, no carotid bruit Ears- normal ear canal Chest- no chest wall deformities, no chest wall tenderness Breast- refused as has upcoming mammogram Cardiovascular- normal s1,s2, no murmurs/ rubs/ gallops, no edema, normal dorsalis pedis Respiratory- bilateral clear to auscultation, no wheeze, no rhonchi, no crackles Abdomen- bowel sounds present, soft, non tender, no organomegaly, no abdominal bruits, no guarding or rigidity, no CVA tenderness Pelvic exam- deferred Musculoskeletal- able to move all 4 extremities, no spinal and paraspinal tenderness, steady gait, no use of assistive device Neurological- no focal deficit, normal reflexes, normal muscle strength, normal sensation to fine touch and vibration Psychiatry- alert and oriented to person, place and time, normal mood and affect  Labs reviewed: Basic Metabolic Panel:  Recent Labs  05/31/14 1721 06/30/14 0831  NA 141 142  K 4.0 4.5  CL 102 101  CO2 26 27  GLUCOSE 87 82  BUN 14 14  CREATININE 0.66 0.61  CALCIUM 9.8 10.2   Liver Function  Tests:  Recent Labs  05/31/14 1721 06/30/14 0831  AST 17 16  ALT 12 24  ALKPHOS 79 108  BILITOT 0.3 <0.2  PROT 7.0 6.4  ALBUMIN 3.6  --    No results for input(s): LIPASE, AMYLASE in the last 8760 hours. No results for input(s): AMMONIA in the last 8760 hours. CBC:  Recent Labs  05/31/14 1721 06/30/14 0831  WBC 6.7 7.4  NEUTROABS  --  5.0  HGB 12.1 11.6  HCT 35.3* 34.7  MCV 89.1 90  PLT 303  --    Lipid Panel     Component Value Date/Time   TRIG 190* 06/30/2014 0831   HDL 38* 06/30/2014 0831   CHOLHDL 4.5* 06/30/2014 0831   LDLCALC 96 06/30/2014 0831   EKG:05/31/14 Normal sinus rhythm Non-specific intra-ventricular conduction block   07/23/14 mmse 30/30  Assessment/Plan  1. Benign hypertensive heart disease without heart failure Continue atenolol for now with baby aspirin  2.  Gastroesophageal reflux disease, esophagitis presence not specified Continue omeprazole  3. Hyperlipidemia with target LDL less than 130 Stable, continue zocor 40 mg daily  4. Osteopenia Continue vitamin d and ca supplement, pending dexa 08/2014  5. Intraventricular block New ekg change noted on 9/15. Currently no cardiac symptom. Given her HTN and new ekg changes with her age will refer to cardiology - Ambulatory referral to Cardiology  6. Annual exam the patient was counseled regarding regular self-examination of the breasts on a monthly basis, prevention of dental and periodontal disease, diet, regular sustained exercise for at least 30 minutes 5 times per week, routine screening interval for mammogram as recommended by the [place] and ACOG, the proper use of sunscreen and protective clothing, tobacco use, and recommended schedule for GI hemoccult testing, colonoscopy, cholesterol, thyroid and diabetes screening. FOBT card provided. Reviewed labs. uptodate with influenza vaccine. Pneumococcal 13 given. Pt does not have advance directive and refuses reading material on  it  7. Sigmoid diverticulosis No known recent episodes of diverticulitis. Encouraged fiber intake. Regular bowel movement.   Blanchie Serve, MD  [facility] [phone] (Monday-Friday 8 am - 5 pm) [phone] (afterhours)

## 2014-07-23 NOTE — Addendum Note (Signed)
Addended by: Logan Bores on: 07/23/2014 01:16 PM   Modules accepted: Orders

## 2014-07-24 ENCOUNTER — Other Ambulatory Visit: Payer: Medicare Other

## 2014-07-24 DIAGNOSIS — Z1212 Encounter for screening for malignant neoplasm of rectum: Secondary | ICD-10-CM

## 2014-07-25 LAB — FECAL OCCULT BLOOD, IMMUNOCHEMICAL: Fecal Occult Bld: NEGATIVE

## 2014-07-30 ENCOUNTER — Encounter: Payer: Self-pay | Admitting: *Deleted

## 2014-08-12 DIAGNOSIS — H04123 Dry eye syndrome of bilateral lacrimal glands: Secondary | ICD-10-CM | POA: Diagnosis not present

## 2014-08-12 DIAGNOSIS — Z961 Presence of intraocular lens: Secondary | ICD-10-CM | POA: Diagnosis not present

## 2014-08-12 DIAGNOSIS — H43813 Vitreous degeneration, bilateral: Secondary | ICD-10-CM | POA: Diagnosis not present

## 2014-08-15 ENCOUNTER — Telehealth: Payer: Self-pay | Admitting: *Deleted

## 2014-08-15 NOTE — Telephone Encounter (Signed)
Received a [employer] order for Bone Density due to Low Bone Mass/2 year follow up. Stamped and faxed back to [facility] Fax# [phone]

## 2014-08-21 ENCOUNTER — Encounter: Payer: Self-pay | Admitting: *Deleted

## 2014-08-21 DIAGNOSIS — M8580 Other specified disorders of bone density and structure, unspecified site: Secondary | ICD-10-CM | POA: Diagnosis not present

## 2014-08-21 DIAGNOSIS — Z1231 Encounter for screening mammogram for malignant neoplasm of breast: Secondary | ICD-10-CM | POA: Diagnosis not present

## 2014-08-21 LAB — HM DEXA SCAN

## 2014-08-21 LAB — HM MAMMOGRAPHY

## 2014-08-27 ENCOUNTER — Encounter: Payer: Self-pay | Admitting: Cardiology

## 2014-08-27 ENCOUNTER — Telehealth: Payer: Self-pay | Admitting: *Deleted

## 2014-08-27 ENCOUNTER — Ambulatory Visit (INDEPENDENT_AMBULATORY_CARE_PROVIDER_SITE_OTHER): Payer: Medicare Other | Admitting: Cardiology

## 2014-08-27 VITALS — BP 102/68 | HR 67 | Ht 59.0 in | Wt 159.0 lb

## 2014-08-27 DIAGNOSIS — K219 Gastro-esophageal reflux disease without esophagitis: Secondary | ICD-10-CM | POA: Diagnosis not present

## 2014-08-27 DIAGNOSIS — I119 Hypertensive heart disease without heart failure: Secondary | ICD-10-CM | POA: Diagnosis not present

## 2014-08-27 DIAGNOSIS — I447 Left bundle-branch block, unspecified: Secondary | ICD-10-CM | POA: Diagnosis not present

## 2014-08-27 NOTE — Progress Notes (Signed)
[place], [place] [place], [place]  [address] Phone: [phone] Fax:  [phone]  Date:  08/27/2014   ID:  Brooke Cox, DOB 28-Oct-1940, MRN [id number]  PCP:  Blanchie Serve, MD  Cardiologist:  Fransico Him, MD    History of Present Illness: Brooke Cox is a 73 y.o. female with a history of HTN, GERD, dyslipidemia who is referred for evaluation of new EKG changes.  She denies any chest pain, SOB, DOE, LE edema, dizziness, palpitations or syncope.     Wt Readings from Last 3 Encounters:  08/27/14 159 lb (72.122 kg)  07/23/14 157 lb (71.215 kg)  05/31/14 158 lb (71.668 kg)     Past Medical History  Diagnosis Date  . Hyperosmolality and/or hypernatremia   . Unspecified essential hypertension   . Unspecified urinary incontinence   . Esophageal reflux   . Encounter for long-term (current) use of other medications   . Other and unspecified hyperlipidemia   . Reflux esophagitis   . Disorder of bone and cartilage, unspecified   . Palpitations   . Disorders of bursae and tendons in shoulder region, unspecified   . Pain in limb   . Pure hypercholesterolemia   . Pain in joint, shoulder region   . Tachycardia, unspecified   . Osteoporosis, unspecified     Current Outpatient Prescriptions  Medication Sig Dispense Refill  . acetaminophen (TYLENOL) 325 MG tablet Take 325 mg by mouth every 6 (six) hours as needed for moderate pain.    Marland Kitchen aspirin 81 MG tablet Take 81 mg by mouth daily.    Marland Kitchen atenolol (TENORMIN) 25 MG tablet Take 1 tablet (25 mg total) by mouth at bedtime. 90 tablet 3  . Cholecalciferol (VITAMIN D) 2000 UNITS tablet Take 2,000 Units by mouth 2 (two) times daily. Take one tablet once daily    . omeprazole (PRILOSEC) 20 MG capsule Take 1 capsule (20 mg total) by mouth daily. 90 capsule 3  . simvastatin (ZOCOR) 40 MG tablet Take 1 tablet (40 mg total) by mouth daily at 6 PM. 90 tablet 3   No current facility-administered medications for this visit.     Allergies:    Allergies  Allergen Reactions  . Shellfish Allergy Nausea And Vomiting    Social History:  The patient  reports that she has never smoked. She does not have any smokeless tobacco history on file. She reports that she does not drink alcohol or use illicit drugs.   Family History:  The patient's family history is not on file.   ROS:  Please see the history of present illness.      All other systems reviewed and negative.   PHYSICAL EXAM: VS:  BP 102/68 mmHg  Pulse 67  Ht 4' 11"  (1.499 m)  Wt 159 lb (72.122 kg)  BMI 32.10 kg/m2  SpO2 99% Well nourished, well developed, in no acute distress HEENT: normal Neck: no JVD Cardiac:  normal S1, S2; RRR; no murmur Lungs:  clear to auscultation bilaterally, no wheezing, rhonchi or rales Abd: soft, nontender, no hepatomegaly Ext: no edema Skin: warm and dry Neuro:  CNs 2-12 intact, no focal abnormalities noted  EKG:  NSR with LBBB     ASSESSMENT AND PLAN:  1. LBBB which is new per PCP but when reviewing old EKGs in EPIC this is an old finding.  Patient is completely asymptomatic from a cardiac standpoint.  I will get a Lexiscan myoview to rule out ischemia and 2D echo to assess  LVF. 2. HTN well controlled 3. GERD  Followup PRN pending results of studies  Signed, Fransico Him, MD [facility] 08/27/2014 9:27 AM

## 2014-08-27 NOTE — Telephone Encounter (Signed)
Received Bone Density Report from [facility]. Given to Dr. Bubba Camp to Review. Abstracted into chart.

## 2014-08-27 NOTE — Telephone Encounter (Signed)
Received an Order from [facility] for patient to have a Diagnostic Mammogram due to inconclusive Mammogram. Stamped with Dr. Bubba Camp and faxed back to [facility]: [phone]

## 2014-08-27 NOTE — Patient Instructions (Signed)
Your physician has requested that you have a lexiscan myoview. For further information please visit [email or web address]. Please follow instruction sheet, as given.  Your physician recommends that you schedule a follow-up appointment as needed with Dr. Radford Pax.  Your physician has requested that you have an echocardiogram. Echocardiography is a painless test that uses sound waves to create images of your heart. It provides your doctor with information about the size and shape of your heart and how well your heart's chambers and valves are working. This procedure takes approximately one hour. There are no restrictions for this procedure.

## 2014-09-01 DIAGNOSIS — N6001 Solitary cyst of right breast: Secondary | ICD-10-CM | POA: Diagnosis not present

## 2014-09-01 NOTE — Telephone Encounter (Signed)
Received [facility] Order for Cyst Aspiration-Right attempt aspiration biopsy if needed. Stamped with Dr. Bubba Camp name and faxed back to [facility]: [phone]

## 2014-09-08 ENCOUNTER — Ambulatory Visit (HOSPITAL_COMMUNITY): Payer: Medicare Other | Attending: Cardiology | Admitting: Radiology

## 2014-09-08 ENCOUNTER — Ambulatory Visit (HOSPITAL_BASED_OUTPATIENT_CLINIC_OR_DEPARTMENT_OTHER): Payer: Medicare Other

## 2014-09-08 DIAGNOSIS — R9431 Abnormal electrocardiogram [ECG] [EKG]: Secondary | ICD-10-CM | POA: Diagnosis not present

## 2014-09-08 DIAGNOSIS — I119 Hypertensive heart disease without heart failure: Secondary | ICD-10-CM

## 2014-09-08 DIAGNOSIS — E785 Hyperlipidemia, unspecified: Secondary | ICD-10-CM | POA: Insufficient documentation

## 2014-09-08 DIAGNOSIS — I10 Essential (primary) hypertension: Secondary | ICD-10-CM | POA: Diagnosis not present

## 2014-09-08 DIAGNOSIS — I447 Left bundle-branch block, unspecified: Secondary | ICD-10-CM

## 2014-09-08 DIAGNOSIS — R5383 Other fatigue: Secondary | ICD-10-CM | POA: Insufficient documentation

## 2014-09-08 MED ORDER — TECHNETIUM TC 99M SESTAMIBI GENERIC - CARDIOLITE
10.0000 | Freq: Once | INTRAVENOUS | Status: AC | PRN
  Administered 2014-09-08: 10 via INTRAVENOUS

## 2014-09-08 MED ORDER — TECHNETIUM TC 99M SESTAMIBI GENERIC - CARDIOLITE
30.0000 | Freq: Once | INTRAVENOUS | Status: AC | PRN
  Administered 2014-09-08: 30 via INTRAVENOUS

## 2014-09-08 MED ORDER — ADENOSINE (DIAGNOSTIC) 3 MG/ML IV SOLN
0.5600 mg/kg | Freq: Once | INTRAVENOUS | Status: AC
  Administered 2014-09-08: 40.2 mg via INTRAVENOUS

## 2014-09-08 NOTE — Progress Notes (Signed)
[facility] [address] [place], [place] [address] [phone]    Cardiology Nuclear Med Study  Brooke Cox is a 73 y.o. female     MRN : [id number]     DOB: Jan 21, 1941  Procedure Date: 09/08/2014  Nuclear Med Background Indication for Stress Test:  Evaluation for Ischemia and Abnormal EKG History:  No known CAD Cardiac Risk Factors: Hypertension and Lipids  Symptoms:  Fatigue   Nuclear Pre-Procedure Caffeine/Decaff Intake:  None NPO After: 7:00pm   Lungs:  clear O2 Sat: 96% on room air. IV 0.9% NS with Angio Cath:  22g  IV Site: R Hand  IV Started by:  Matilde Haymaker, RN  Chest Size (in):  36 Cup Size: B  Height: 4' 11"  (1.499 m)  Weight:  158 lb (71.668 kg)  BMI:  Body mass index is 31.89 kg/(m^2). Tech Comments:  No Atenolol x 36 hrs    Nuclear Med Study 1 or 2 day study: 1 day  Stress Test Type:  Adenosine  Reading MD: n/a  Order Authorizing Provider:  Tressia Miners Turner,MD  Resting Radionuclide: Technetium 55mSestamibi  Resting Radionuclide Dose: 11.0 mCi   Stress Radionuclide:  Technetium 915mestamibi  Stress Radionuclide Dose: 33.0 mCi           Stress Protocol Rest HR: 79 Stress HR: 110  Rest BP: 163/85 Stress BP: 120/61  Exercise Time (min): n/a METS: n/a   Predicted Max HR: 145 bpm % Max HR: 75.86 bpm Rate Pressure Product: 14410   Dose of Adenosine (mg):  40 mg Dose of Lexiscan: n/a mg  Dose of Atropine (mg): n/a Dose of Dobutamine: n/a mcg/kg/min (at max HR)  Stress Test Technologist: ShGlade LloydBS-ES  Nuclear Technologist:  ElEarl ManyCNMT     Rest Procedure:  Myocardial perfusion imaging was performed at rest 45 minutes following the intravenous administration of Technetium 9926mstamibi. Rest ECG: NSR-LBBB  Stress Procedure:  The patient received IV adenosine at 140 mcg/kg/min for 4 minutes.  Technetium 32m10mtamibi was injected at the 2 minute mark and quantitative spect images were obtained after a  45 minute delay.  During the infusion of Adenosine the patient complained of SOB and lightheadedness which completely resolved in recovery.  Stress ECG: No significant change from baseline ECG  QPS Raw Data Images:  Normal; no motion artifact; normal heart/lung ratio. Stress Images:  Normal homogeneous uptake in all areas of the myocardium. Rest Images:  Normal homogeneous uptake in all areas of the myocardium. Subtraction (SDS):  No evidence of ischemia. Transient Ischemic Dilatation (Normal <1.22):  1.24 Lung/Heart Ratio (Normal <0.45):  0.28  Quantitative Gated Spect Images QGS EDV:  69 ml QGS ESV:  25 ml  Impression Exercise Capacity:  Adenosine study with no exercise. BP Response:  Normal blood pressure response. Clinical Symptoms:  No significant symptoms noted. ECG Impression:  No significant ST segment change suggestive of ischemia. Comparison with Prior Nuclear Study: No previous nuclear study performed  Overall Impression:  Normal stress nuclear study.  LV Ejection Fraction: 64%.  LV Wall Motion:  NL LV Function; NL Wall Motion.   PhilThayer Headings.,Brooke BonitoD, FACCSwedish Medical Center - [facility] Ed28/2015, 4:50 PM [address] [phone].

## 2014-09-08 NOTE — Progress Notes (Signed)
2D Echo completed. 09/08/2014

## 2014-09-09 ENCOUNTER — Encounter: Payer: Self-pay | Admitting: Internal Medicine

## 2014-09-09 DIAGNOSIS — N63 Unspecified lump in breast: Secondary | ICD-10-CM | POA: Diagnosis not present

## 2014-09-26 ENCOUNTER — Encounter: Payer: Self-pay | Admitting: Internal Medicine

## 2014-11-04 ENCOUNTER — Encounter: Payer: Self-pay | Admitting: Internal Medicine

## 2015-07-28 ENCOUNTER — Encounter: Payer: Medicare Other | Admitting: Internal Medicine

## 2015-07-30 ENCOUNTER — Ambulatory Visit (INDEPENDENT_AMBULATORY_CARE_PROVIDER_SITE_OTHER): Payer: Medicare Other | Admitting: Nurse Practitioner

## 2015-07-30 ENCOUNTER — Encounter: Payer: Self-pay | Admitting: Nurse Practitioner

## 2015-07-30 VITALS — BP 118/70 | HR 57 | Temp 98.1°F | Resp 12 | Ht 58.08 in | Wt 140.0 lb

## 2015-07-30 DIAGNOSIS — M8580 Other specified disorders of bone density and structure, unspecified site: Secondary | ICD-10-CM

## 2015-07-30 DIAGNOSIS — K219 Gastro-esophageal reflux disease without esophagitis: Secondary | ICD-10-CM | POA: Diagnosis not present

## 2015-07-30 DIAGNOSIS — H6122 Impacted cerumen, left ear: Secondary | ICD-10-CM | POA: Diagnosis not present

## 2015-07-30 DIAGNOSIS — I119 Hypertensive heart disease without heart failure: Secondary | ICD-10-CM

## 2015-07-30 DIAGNOSIS — E785 Hyperlipidemia, unspecified: Secondary | ICD-10-CM | POA: Diagnosis not present

## 2015-07-30 DIAGNOSIS — Z23 Encounter for immunization: Secondary | ICD-10-CM

## 2015-07-30 DIAGNOSIS — Z0000 Encounter for general adult medical examination without abnormal findings: Secondary | ICD-10-CM

## 2015-07-30 MED ORDER — ATENOLOL 25 MG PO TABS: 25.0000 mg | ORAL_TABLET | Freq: Every day | ORAL | Status: DC

## 2015-07-30 MED ORDER — OMEPRAZOLE 20 MG PO CPDR: 20.0000 mg | DELAYED_RELEASE_CAPSULE | Freq: Every day | ORAL | Status: DC

## 2015-07-30 MED ORDER — SIMVASTATIN 40 MG PO TABS: 40.0000 mg | ORAL_TABLET | Freq: Every day | ORAL | Status: DC

## 2015-07-30 NOTE — Progress Notes (Signed)
Patient ID: Brooke Cox, female   DOB: [birth year], 74 y.o.   MRN: [id number]    PCP: [name], NP  Advanced Directive information Does patient have an advance directive?: No, Would patient like information on creating an advanced directive?: No - patient declined information  Allergies  Allergen Reactions  . Shellfish Allergy Nausea And Vomiting    Chief Complaint  Patient presents with  . Annual Exam    Yearly check-up, EKG completed 08/2014, fasting labs, no pap. MMSE 30/30, passed clock drawing.   . Medical Management of Chronic Issues    HTN, High Cholesterol,  . Medication Refill    Refill atenolol and simvastatin at [employer], print prilosec RX   . Immunizations    Flu Vaccine      HPI: Patient is a 74 y.o. female seen in the office today for wellness exam. No major illnesses or hospitalization in the last year.  Screenings: Colon Cancer- unsure of date but reports in the last 10 years- possible 5 years done by Dr Amedeo Plenty ([facility]) Breast Cancer- mammograms yearly in December Cervical Cancer- aged out.  Osteoporosis- Dexa Scan completed in Dec 2015- having done every 3 years  Depression screening Depression screen [facility] 2/9 07/30/2015 07/23/2014 07/03/2013  Decreased Interest 0 0 0  Down, Depressed, Hopeless 0 0 0  PHQ - 2 Score 0 0 0   Falls Fall Risk  07/30/2015 07/23/2014 07/03/2013  Falls in the past year? No No No   MMSE MMSE - Mini Mental State Exam 07/30/2015 07/23/2014  Orientation to time 5 5  Orientation to Place 5 5  Registration 3 3  Attention/ Calculation 5 5  Recall 3 3  Language- name 2 objects 2 2  Language- repeat 1 1  Language- follow 3 step command 3 3  Language- read & follow direction 1 1  Write a sentence 1 1  Copy design 1 1  Total score 30 30   Vaccines Immunization History  Administered Date(s) Administered  . Influenza,inj,Quad PF,36+ Mos 07/03/2013  . Influenza-Unspecified 07/03/2012, 06/12/2014  . Pneumococcal  Conjugate-13 07/23/2014  . Pneumococcal Polysaccharide-23 02/20/2008  . Tdap 07/05/2011  . Zoster 07/28/2011    Smoking status: never smoked Alcohol use: none  Dentist: every 6 months Ophthalmologist: every 2 years, Dr Katy Fitch  Exercise regimen: low impact aerobics 45 mins, 3 times a week Diet: cut back on sweets and breads  Functional Status of ADLs: independent of all ADLs and iADLs   Acid reflux- taking omeprazole which controls symptoms  hypertension- controlled with atenolol  Osteopenia- taking vit D 2000 units Hyperlipidemia- taking zocor daily   Review of Systems:  Review of Systems  Constitutional: Negative for activity change, appetite change, fatigue and unexpected weight change.  HENT: Negative for congestion and hearing loss.   Eyes: Negative.   Respiratory: Negative for cough and shortness of breath.   Cardiovascular: Negative for chest pain, palpitations and leg swelling.  Gastrointestinal: Negative for abdominal pain, diarrhea and constipation.  Genitourinary: Negative for dysuria and difficulty urinating.  Musculoskeletal: Negative for myalgias and arthralgias.  Skin: Negative for color change and wound.  Neurological: Negative for dizziness and weakness.  Psychiatric/Behavioral: Negative for behavioral problems, confusion and agitation.    Past Medical History  Diagnosis Date  . Hyperosmolality and/or hypernatremia   . Unspecified essential hypertension   . Unspecified urinary incontinence   . Esophageal reflux   . Encounter for long-term (current) use of other medications   . Other and unspecified hyperlipidemia   .  Reflux esophagitis   . Disorder of bone and cartilage, unspecified   . Palpitations   . Disorders of bursae and tendons in shoulder region, unspecified   . Pain in limb   . Pure hypercholesterolemia   . Pain in joint, shoulder region   . Tachycardia, unspecified   . Osteoporosis, unspecified    Past Surgical History  Procedure  Laterality Date  . Colonoscopy  03/31/2009    Diverticulosis, Dr.John Amedeo Plenty   . Diagnostic mammogram    . Bone density  08/14/2012  . Cholecystectomy  2004  . Appendectomy  1955  . Cataract extraction Bilateral 2010   Social History:   reports that she has never smoked. She does not have any smokeless tobacco history on file. She reports that she does not drink alcohol or use illicit drugs.  History reviewed. No pertinent family history.  Medications: Patient's Medications  New Prescriptions   No medications on file  Previous Medications   ACETAMINOPHEN (TYLENOL) 325 MG TABLET    Take 325 mg by mouth every 6 (six) hours as needed for moderate pain.   ASPIRIN 81 MG TABLET    Take 81 mg by mouth daily.   CHOLECALCIFEROL (VITAMIN D) 2000 UNITS TABLET    Take 2,000 Units by mouth 2 (two) times daily. Take one tablet once daily  Modified Medications   Modified Medication Previous Medication   ATENOLOL (TENORMIN) 25 MG TABLET atenolol (TENORMIN) 25 MG tablet      Take 1 tablet (25 mg total) by mouth at bedtime.    Take 1 tablet (25 mg total) by mouth at bedtime.   OMEPRAZOLE (PRILOSEC) 20 MG CAPSULE omeprazole (PRILOSEC) 20 MG capsule      Take 1 capsule (20 mg total) by mouth daily.    Take 1 capsule (20 mg total) by mouth daily.   SIMVASTATIN (ZOCOR) 40 MG TABLET simvastatin (ZOCOR) 40 MG tablet      Take 1 tablet (40 mg total) by mouth daily at 6 PM.    Take 1 tablet (40 mg total) by mouth daily at 6 PM.  Discontinued Medications   No medications on file     Physical Exam:  Filed Vitals:   07/30/15 0859  BP: 118/70  Pulse: 57  Temp: 98.1 F (36.7 C)  TempSrc: Oral  Resp: 12  Height: 4' 10.08" (1.475 m)  Weight: 140 lb (63.504 kg)  SpO2: 95%   Body mass index is 29.19 kg/(m^2).  Physical Exam  Constitutional: She is oriented to person, place, and time. She appears well-developed and well-nourished. No distress.  HENT:  Head: Normocephalic and atraumatic.  Right Ear:  External ear normal.  Left Ear: External ear normal.  Nose: Nose normal.  Mouth/Throat: Oropharynx is clear and moist. No oropharyngeal exudate.  Eyes: Conjunctivae are normal. Pupils are equal, round, and reactive to light.  Neck: Normal range of motion. Neck supple.  Cardiovascular: Normal rate, regular rhythm and normal heart sounds.   Pulmonary/Chest: Effort normal and breath sounds normal.  Abdominal: Soft. Bowel sounds are normal. She exhibits no distension. There is no tenderness.  Musculoskeletal: Normal range of motion. She exhibits no edema or tenderness.  Neurological: She is alert and oriented to person, place, and time.  Skin: Skin is warm and dry. She is not diaphoretic.  Psychiatric: She has a normal mood and affect.    Labs reviewed: Basic Metabolic Panel: No results for input(s): NA, K, CL, CO2, GLUCOSE, BUN, CREATININE, CALCIUM, MG, PHOS, TSH in the  last 8760 hours. Liver Function Tests: No results for input(s): AST, ALT, ALKPHOS, BILITOT, PROT, ALBUMIN in the last 8760 hours. No results for input(s): LIPASE, AMYLASE in the last 8760 hours. No results for input(s): AMMONIA in the last 8760 hours. CBC: No results for input(s): WBC, NEUTROABS, HGB, HCT, MCV, PLT in the last 8760 hours. Lipid Panel: No results for input(s): CHOL, HDL, LDLCALC, TRIG, CHOLHDL, LDLDIRECT in the last 8760 hours. TSH: No results for input(s): TSH in the last 8760 hours. A1C: No results found for: HGBA1C   Assessment/Plan  1. Need for influenza vaccination - Flu Vaccine QUAD 36+ mos PF IM (Fluarix & Fluzone Quad PF)  2. Medicare annual wellness visit, subsequent The patient is doing well and no distinct problems were identified on exam. The patient was counseled regarding the appropriate use of alcohol, regular self-examination of the breasts on a monthly basis, prevention of dental and periodontal disease, diet, regular sustained exercise for at least 30 minutes 5 times per week,  routine screening interval for mammogram as recommended by the [place] and ACOG,  and recommended schedule for GI hemoccult testing, colonoscopy, cholesterol, thyroid and diabetes screening. - Comprehensive metabolic panel - CBC with Differential - Lipid Panel - Ambulatory referral to Gastroenterology  3. Hyperlipidemia with target LDL less than 130 -conts on zocor, heart healthy diet and exercise - Comprehensive metabolic panel - Lipid Panel  4. Gastroesophageal reflux disease without esophagitis -well controlled on omeprazole   5. Impacted cerumen of left ear -removed, pt tolerated well.   6. Osteopenia Cont to follow up dexa scan every 3 years. Taking vit D, recommended    7. Benign hypertensive heart disease without heart failure -stable on atenolol - CBC with Differential    Evania Lyne K. Harle Battiest  [facility] [phone] 8 am - 5 pm) [phone] (after hours)

## 2015-07-30 NOTE — Addendum Note (Signed)
Addended by: Logan Bores on: 07/30/2015 11:51 AM   Modules accepted: Orders

## 2015-07-30 NOTE — Patient Instructions (Signed)
1200 mg of calcium (total of diet and supplement) to take along with vit D supplement  Recommending colonoscopy follow up

## 2015-07-31 LAB — COMPREHENSIVE METABOLIC PANEL
A/G RATIO: 1.9 (ref 1.1–2.5)
ALBUMIN: 4.1 g/dL (ref 3.5–4.8)
ALK PHOS: 69 IU/L (ref 39–117)
ALT: 15 IU/L (ref 0–32)
AST: 20 IU/L (ref 0–40)
BUN/Creatinine Ratio: 19 (ref 11–26)
BUN: 13 mg/dL (ref 8–27)
Bilirubin Total: 0.5 mg/dL (ref 0.0–1.2)
CO2: 25 mmol/L (ref 18–29)
CREATININE: 0.67 mg/dL (ref 0.57–1.00)
Calcium: 10.2 mg/dL (ref 8.7–10.3)
Chloride: 102 mmol/L (ref 97–106)
GFR calc Af Amer: 99 mL/min/{1.73_m2} (ref >59)
GFR calc non Af Amer: 86 mL/min/{1.73_m2} (ref >59)
GLOBULIN, TOTAL: 2.2 g/dL (ref 1.5–4.5)
Glucose: 75 mg/dL (ref 65–99)
Potassium: 4.2 mmol/L (ref 3.5–5.2)
Sodium: 140 mmol/L (ref 136–144)
Total Protein: 6.3 g/dL (ref 6.0–8.5)

## 2015-07-31 LAB — CBC WITH DIFFERENTIAL/PLATELET
Basophils Absolute: 0 10*3/uL (ref 0.0–0.2)
Basos: 0 %
EOS (ABSOLUTE): 0.1 10*3/uL (ref 0.0–0.4)
EOS: 1 %
HEMATOCRIT: 37 % (ref 34.0–46.6)
HEMOGLOBIN: 12.6 g/dL (ref 11.1–15.9)
IMMATURE GRANS (ABS): 0 10*3/uL (ref 0.0–0.1)
IMMATURE GRANULOCYTES: 0 %
Lymphocytes Absolute: 1.5 10*3/uL (ref 0.7–3.1)
Lymphs: 24 %
MCH: 31.4 pg (ref 26.6–33.0)
MCHC: 34.1 g/dL (ref 31.5–35.7)
MCV: 92 fL (ref 79–97)
MONOCYTES: 7 %
Monocytes Absolute: 0.4 10*3/uL (ref 0.1–0.9)
NEUTROS PCT: 68 %
Neutrophils Absolute: 4.2 10*3/uL (ref 1.4–7.0)
Platelets: 318 10*3/uL (ref 150–379)
RBC: 4.01 x10E6/uL (ref 3.77–5.28)
RDW: 13.5 % (ref 12.3–15.4)
WBC: 6.2 10*3/uL (ref 3.4–10.8)

## 2015-07-31 LAB — LIPID PANEL
CHOL/HDL RATIO: 2.8 ratio (ref 0.0–4.4)
Cholesterol, Total: 196 mg/dL (ref 100–199)
HDL: 70 mg/dL (ref >39)
LDL CALC: 108 mg/dL — AB (ref 0–99)
TRIGLYCERIDES: 91 mg/dL (ref 0–149)
VLDL CHOLESTEROL CAL: 18 mg/dL (ref 5–40)

## 2015-08-13 ENCOUNTER — Telehealth: Payer: Self-pay | Admitting: *Deleted

## 2015-08-13 NOTE — Telephone Encounter (Signed)
Patient is calling wanting her lab results done on 07/30/2015. Please Advise.

## 2015-08-14 NOTE — Telephone Encounter (Signed)
LMOM to return call.

## 2015-08-14 NOTE — Telephone Encounter (Signed)
Lab work looks good, cholesterol stable from 2 years ago, LDL which is her bad cholesterol remains slightly elevated, heart healthy diet recommended.

## 2015-08-17 NOTE — Telephone Encounter (Signed)
LMOM to return call.

## 2015-08-18 NOTE — Telephone Encounter (Signed)
Patient notified and agreed.  

## 2015-08-25 DIAGNOSIS — Z1231 Encounter for screening mammogram for malignant neoplasm of breast: Secondary | ICD-10-CM | POA: Diagnosis not present

## 2015-08-25 LAB — HM MAMMOGRAPHY: HM MAMMO: NEGATIVE

## 2015-08-26 ENCOUNTER — Encounter: Payer: Self-pay | Admitting: *Deleted

## 2015-09-29 DIAGNOSIS — Z86010 Personal history of colonic polyps: Secondary | ICD-10-CM | POA: Diagnosis not present

## 2016-04-26 ENCOUNTER — Other Ambulatory Visit: Payer: Self-pay | Admitting: *Deleted

## 2016-04-26 MED ORDER — ATENOLOL 25 MG PO TABS: 25.0000 mg | ORAL_TABLET | Freq: Every day | ORAL | 1 refills | Status: DC

## 2016-04-26 NOTE — Telephone Encounter (Signed)
Patient requested Rx to be sent to [facility] due to [facility] not having in stock.

## 2016-05-13 ENCOUNTER — Other Ambulatory Visit: Payer: Self-pay

## 2016-05-13 DIAGNOSIS — E785 Hyperlipidemia, unspecified: Secondary | ICD-10-CM

## 2016-05-31 ENCOUNTER — Encounter: Payer: Self-pay | Admitting: Internal Medicine

## 2016-07-25 ENCOUNTER — Other Ambulatory Visit: Payer: Self-pay | Admitting: Nurse Practitioner

## 2016-07-28 ENCOUNTER — Other Ambulatory Visit: Payer: Medicare Other

## 2016-07-28 DIAGNOSIS — E785 Hyperlipidemia, unspecified: Secondary | ICD-10-CM | POA: Diagnosis not present

## 2016-07-28 LAB — COMPLETE METABOLIC PANEL WITH GFR
ALBUMIN: 4 g/dL (ref 3.6–5.1)
ALK PHOS: 60 U/L (ref 33–130)
ALT: 11 U/L (ref 6–29)
AST: 17 U/L (ref 10–35)
BUN: 17 mg/dL (ref 7–25)
CALCIUM: 10.4 mg/dL (ref 8.6–10.4)
CO2: 26 mmol/L (ref 20–31)
Chloride: 105 mmol/L (ref 98–110)
Creat: 0.7 mg/dL (ref 0.60–0.93)
GFR, EST NON AFRICAN AMERICAN: 84 mL/min (ref >=60)
Glucose, Bld: 79 mg/dL (ref 65–99)
POTASSIUM: 4.2 mmol/L (ref 3.5–5.3)
Sodium: 141 mmol/L (ref 135–146)
Total Bilirubin: 0.5 mg/dL (ref 0.2–1.2)
Total Protein: 6.7 g/dL (ref 6.1–8.1)

## 2016-07-28 LAB — CBC WITH DIFFERENTIAL/PLATELET
BASOS ABS: 54 {cells}/uL (ref 0–200)
Basophils Relative: 1 %
EOS ABS: 162 {cells}/uL (ref 15–500)
Eosinophils Relative: 3 %
HEMATOCRIT: 38.3 % (ref 35.0–45.0)
HEMOGLOBIN: 13 g/dL (ref 11.7–15.5)
Lymphocytes Relative: 25 %
Lymphs Abs: 1350 cells/uL (ref 850–3900)
MCH: 31.3 pg (ref 27.0–33.0)
MCHC: 33.9 g/dL (ref 32.0–36.0)
MCV: 92.1 fL (ref 80.0–100.0)
MONO ABS: 324 {cells}/uL (ref 200–950)
MPV: 8.3 fL (ref 7.5–12.5)
Monocytes Relative: 6 %
NEUTROS PCT: 65 %
Neutro Abs: 3510 cells/uL (ref 1500–7800)
Platelets: 279 10*3/uL (ref 140–400)
RBC: 4.16 MIL/uL (ref 3.80–5.10)
RDW: 13.8 % (ref 11.0–15.0)
WBC: 5.4 10*3/uL (ref 3.8–10.8)

## 2016-07-28 LAB — LIPID PANEL
CHOL/HDL RATIO: 2.8 ratio (ref <5.0)
CHOLESTEROL: 190 mg/dL (ref <200)
HDL: 67 mg/dL (ref >50)
LDL Cholesterol: 100 mg/dL — ABNORMAL HIGH (ref <100)
Triglycerides: 116 mg/dL (ref <150)
VLDL: 23 mg/dL (ref <30)

## 2016-07-29 ENCOUNTER — Other Ambulatory Visit: Payer: Medicare Other

## 2016-08-01 ENCOUNTER — Ambulatory Visit (INDEPENDENT_AMBULATORY_CARE_PROVIDER_SITE_OTHER): Payer: Medicare Other | Admitting: Nurse Practitioner

## 2016-08-01 ENCOUNTER — Encounter: Payer: Self-pay | Admitting: Nurse Practitioner

## 2016-08-01 ENCOUNTER — Ambulatory Visit (INDEPENDENT_AMBULATORY_CARE_PROVIDER_SITE_OTHER): Payer: Medicare Other

## 2016-08-01 VITALS — BP 134/76 | HR 69 | Temp 97.4°F | Ht 58.5 in | Wt 156.6 lb

## 2016-08-01 VITALS — BP 134/76 | HR 97 | Temp 97.4°F | Resp 18 | Ht 60.0 in | Wt 156.6 lb

## 2016-08-01 DIAGNOSIS — E2839 Other primary ovarian failure: Secondary | ICD-10-CM

## 2016-08-01 DIAGNOSIS — E785 Hyperlipidemia, unspecified: Secondary | ICD-10-CM | POA: Diagnosis not present

## 2016-08-01 DIAGNOSIS — Z23 Encounter for immunization: Secondary | ICD-10-CM

## 2016-08-01 DIAGNOSIS — I119 Hypertensive heart disease without heart failure: Secondary | ICD-10-CM | POA: Diagnosis not present

## 2016-08-01 DIAGNOSIS — Z0000 Encounter for general adult medical examination without abnormal findings: Secondary | ICD-10-CM

## 2016-08-01 DIAGNOSIS — M8580 Other specified disorders of bone density and structure, unspecified site: Secondary | ICD-10-CM

## 2016-08-01 DIAGNOSIS — K219 Gastro-esophageal reflux disease without esophagitis: Secondary | ICD-10-CM | POA: Diagnosis not present

## 2016-08-01 MED ORDER — OMEPRAZOLE 20 MG PO CPDR: 20.0000 mg | DELAYED_RELEASE_CAPSULE | Freq: Every day | ORAL | 3 refills | Status: DC

## 2016-08-01 NOTE — Progress Notes (Signed)
Subjective:   Brooke Cox is a 75 y.o. female who presents for Medicare Annual (Subsequent) preventive examination.  Review of Systems: Cardiac Risk Factors include: advanced age (>75mn, >>43women);hypertension     Objective:     Vitals: BP 134/76 (BP Location: Left Arm, Patient Position: Sitting, Cuff Size: Normal)   Pulse 69   Temp 97.4 F (36.3 C) (Oral)   Ht 4' 10.5" (1.486 m)   Wt 156 lb 9.6 oz (71 kg)   SpO2 97%   BMI 32.17 kg/m   Body mass index is 32.17 kg/m.   Tobacco History  Smoking Status  . Never Smoker  Smokeless Tobacco  . Never Used     Counseling given: No   Past Medical History:  Diagnosis Date  . Disorder of bone and cartilage, unspecified   . Disorders of bursae and tendons in shoulder region, unspecified   . Encounter for long-term (current) use of other medications   . Esophageal reflux   . Hyperosmolality and/or hypernatremia   . Osteoporosis, unspecified   . Other and unspecified hyperlipidemia   . Pain in joint, shoulder region   . Pain in limb   . Palpitations   . Pure hypercholesterolemia   . Reflux esophagitis   . Tachycardia, unspecified   . Unspecified essential hypertension   . Unspecified urinary incontinence    Past Surgical History:  Procedure Laterality Date  . APPENDECTOMY  1955  . Bone Density  08/14/2012  . CATARACT EXTRACTION Bilateral 2010  . CHOLECYSTECTOMY  2004  . COLONOSCOPY  03/31/2009   Diverticulosis, Dr.John HAmedeo Plenty  . DIAGNOSTIC MAMMOGRAM     History reviewed. No pertinent family history. History  Sexual Activity  . Sexual activity: No    Outpatient Encounter Prescriptions as of 08/01/2016  Medication Sig  . acetaminophen (TYLENOL) 325 MG tablet Take 325 mg by mouth every 6 (six) hours as needed for moderate pain.  .Marland Kitchenaspirin 81 MG tablet Take 81 mg by mouth daily.  .Marland Kitchenatenolol (TENORMIN) 25 MG tablet Take 1 tablet (25 mg total) by mouth at bedtime.  . Cholecalciferol (VITAMIN D) 2000 UNITS  tablet Take 2,000 Units by mouth 2 (two) times daily. Take one tablet once daily  . omeprazole (PRILOSEC) 20 MG capsule Take 1 capsule (20 mg total) by mouth daily.  . simvastatin (ZOCOR) 40 MG tablet TAKE ONE TABLET BY MOUTH ONCE DAILY AT  6  PM   No facility-administered encounter medications on file as of 08/01/2016.     Activities of Daily Living In your present state of health, do you have any difficulty performing the following activities: 08/01/2016  Hearing? N  Vision? N  Difficulty concentrating or making decisions? N  Walking or climbing stairs? N  Dressing or bathing? N  Doing errands, shopping? N  Preparing Food and eating ? N  Using the Toilet? N  In the past six months, have you accidently leaked urine? Y  Do you have problems with loss of bowel control? N  Managing your Medications? N  Managing your Finances? N  Housekeeping or managing your Housekeeping? N  Some recent data might be hidden    Patient Care Team: JLauree Chandler NP as PCP - General (Geriatric Medicine)    Assessment:    Exercise Activities and Dietary recommendations Current Exercise Habits: Structured exercise class, Time (Minutes): 45, Frequency (Times/Week): 3, Weekly Exercise (Minutes/Week): 135, Intensity: Mild, Exercise limited by: None identified  Goals    . Increase water intake  Starting 08/01/16, I will increase my water intake from 3-4 glasses per day to 8 glasses per day.       Fall Risk Fall Risk  08/01/2016 07/30/2015 07/23/2014 07/03/2013  Falls in the past year? No No No No   Depression Screen PHQ 2/9 Scores 08/01/2016 07/30/2015 07/23/2014 07/03/2013  PHQ - 2 Score 0 0 0 0     Cognitive Function MMSE - Mini Mental State Exam 08/01/2016 07/30/2015 07/23/2014  Orientation to time 5 5 5   Orientation to Place 5 5 5   Registration 3 3 3   Attention/ Calculation 5 5 5   Recall 3 3 3   Language- name 2 objects 2 2 2   Language- repeat 1 1 1   Language- follow 3  step command 3 3 3   Language- read & follow direction 1 1 1   Write a sentence 1 1 1   Copy design 1 1 1   Total score 30 30 30         Immunization History  Administered Date(s) Administered  . Influenza,inj,Quad PF,36+ Mos 07/03/2013, 07/30/2015, 08/01/2016  . Influenza-Unspecified 07/03/2012, 06/12/2014  . Pneumococcal Conjugate-13 07/23/2014  . Pneumococcal Polysaccharide-23 02/20/2008  . Tdap 07/05/2011  . Zoster 07/28/2011   Screening Tests Health Maintenance  Topic Date Due  . TETANUS/TDAP  07/04/2021  . INFLUENZA VACCINE  Completed  . DEXA SCAN  Completed  . ZOSTAVAX  Completed  . PNA vac Low Risk Adult  Completed      Plan:    I have personally reviewed and addressed the Medicare Annual Wellness questionnaire and have noted the following in the patient's chart:  A. Medical and social history B. Use of alcohol, tobacco or illicit drugs  C. Current medications and supplements D. Functional ability and status E.  Nutritional status F.  Physical activity G. Advance directives H. List of other physicians I.  Hospitalizations, surgeries, and ER visits in previous 12 months J.  [place] to include hearing, vision, cognitive, depression L. Referrals and appointments - none  In addition, I have reviewed and discussed with patient certain preventive protocols, quality metrics, and best practice recommendations. A written personalized care plan for preventive services as well as general preventive health recommendations were provided to patient.  See attached scanned questionnaire for additional information.   Signed,   Allyn Kenner, LPN Health Advisor  I reviewed health advisors note, was available for consultation and agree with documentation and plan.  Carlos American. Harle Battiest  [facility] [phone] 8 am - 5 pm) [phone] (after hours)

## 2016-08-01 NOTE — Patient Instructions (Signed)
Brooke Cox , Thank you for taking time to come for your Medicare Wellness Visit. I appreciate your ongoing commitment to your health goals. Please review the following plan we discussed and let me know if I can assist you in the future.   These are the goals we discussed: Goals    None      This is a list of the screening recommended for you and due dates:  Health Maintenance  Topic Date Due  . Flu Shot  04/12/2016  . Tetanus Vaccine  07/04/2021  . DEXA scan (bone density measurement)  Completed  . Shingles Vaccine  Completed  . Pneumonia vaccines  Completed  Preventive Care for Adults  A healthy lifestyle and preventive care can promote health and wellness. Preventive health guidelines for adults include the following key practices.  . A routine yearly physical is a good way to check with your health care provider about your health and preventive screening. It is a chance to share any concerns and updates on your health and to receive a thorough exam.  . Visit your dentist for a routine exam and preventive care every 6 months. Brush your teeth twice a day and floss once a day. Good oral hygiene prevents tooth decay and gum disease.  . The frequency of eye exams is based on your age, health, family medical history, use  of contact lenses, and other factors. Follow your health care provider's ecommendations for frequency of eye exams.  . Eat a healthy diet. Foods like vegetables, fruits, whole grains, low-fat dairy products, and lean protein foods contain the nutrients you need without too many calories. Decrease your intake of foods high in solid fats, added sugars, and salt. Eat the right amount of calories for you. Get information about a proper diet from your health care provider, if necessary.  . Regular physical exercise is one of the most important things you can do for your health. Most adults should get at least 150 minutes of moderate-intensity exercise (any activity that  increases your heart rate and causes you to sweat) each week. In addition, most adults need muscle-strengthening exercises on 2 or more days a week.  Silver Sneakers may be a benefit available to you. To determine eligibility, you may visit the website: www.silversneakers.com or contact program at (308)297-3459 Mon-Fri between 8AM-8PM.   . Maintain a healthy weight. The body mass index (BMI) is a screening tool to identify possible weight problems. It provides an estimate of body fat based on height and weight. Your health care provider can find your BMI and can help you achieve or maintain a healthy weight.   For adults 20 years and older: ? A BMI below 18.5 is considered underweight. ? A BMI of 18.5 to 24.9 is normal. ? A BMI of 25 to 29.9 is considered overweight. ? A BMI of 30 and above is considered obese.   . Maintain normal blood lipids and cholesterol levels by exercising and minimizing your intake of saturated fat. Eat a balanced diet with plenty of fruit and vegetables. Blood tests for lipids and cholesterol should begin at age 83 and be repeated every 5 years. If your lipid or cholesterol levels are high, you are over 50, or you are at high risk for heart disease, you may need your cholesterol levels checked more frequently. Ongoing high lipid and cholesterol levels should be treated with medicines if diet and exercise are not working.  . If you smoke, find out from your health  care provider how to quit. If you do not use tobacco, please do not start.  . If you choose to drink alcohol, please do not consume more than 2 drinks per day. One drink is considered to be 12 ounces (355 mL) of beer, 5 ounces (148 mL) of wine, or 1.5 ounces (44 mL) of liquor.  . If you are 27-52 years old, ask your health care provider if you should take aspirin to prevent strokes.  . Use sunscreen. Apply sunscreen liberally and repeatedly throughout the day. You should seek shade when your shadow is shorter  than you. Protect yourself by wearing long sleeves, pants, a wide-brimmed hat, and sunglasses year round, whenever you are outdoors.  . Once a month, do a whole body skin exam, using a mirror to look at the skin on your back. Tell your health care provider of new moles, moles that have irregular borders, moles that are larger than a pencil eraser, or moles that have changed in shape or color.

## 2016-08-01 NOTE — Progress Notes (Signed)
Careteam: Patient Care Team: [name], NP as PCP - General (Geriatric Medicine)  Advanced Directive information Does patient have an advance directive?: No, Would patient like information on creating an advanced directive?: Yes - Educational materials given  Allergies  Allergen Reactions  . Shellfish Allergy Nausea And Vomiting    Chief Complaint  Patient presents with  . Medical Management of Chronic Issues    Extended Visit. Passed clock drawing.      HPI: Patient is a 75 y.o. female seen in the office today for routine visit after AWV. Pt has been doing well. No major illness and hospitalizations in the last year.   Has mammogram scheduled in December, due to dexa scan Has eye appt in December  She has been doing well- not been doing well with diet, lots of chocolate ice cream   She has no complaints GERD is controlled on omeprazole   Review of Systems:  Review of Systems  Constitutional: Negative for activity change, appetite change, fatigue and unexpected weight change.  HENT: Negative for congestion and hearing loss.   Eyes: Negative.   Respiratory: Negative for cough and shortness of breath.   Cardiovascular: Negative for chest pain, palpitations and leg swelling.  Gastrointestinal: Negative for abdominal pain, constipation and diarrhea.  Genitourinary: Negative for difficulty urinating and dysuria.  Musculoskeletal: Negative for arthralgias and myalgias.  Skin: Negative for color change and wound.  Neurological: Negative for dizziness and weakness.  Psychiatric/Behavioral: Negative for agitation, behavioral problems and confusion.    Past Medical History:  Diagnosis Date  . Disorder of bone and cartilage, unspecified   . Disorders of bursae and tendons in shoulder region, unspecified   . Encounter for long-term (current) use of other medications   . Esophageal reflux   . Hyperosmolality and/or hypernatremia   . Osteoporosis, unspecified   .  Other and unspecified hyperlipidemia   . Pain in joint, shoulder region   . Pain in limb   . Palpitations   . Pure hypercholesterolemia   . Reflux esophagitis   . Tachycardia, unspecified   . Unspecified essential hypertension   . Unspecified urinary incontinence    Past Surgical History:  Procedure Laterality Date  . APPENDECTOMY  1955  . Bone Density  08/14/2012  . CATARACT EXTRACTION Bilateral 2010  . CHOLECYSTECTOMY  2004  . COLONOSCOPY  03/31/2009   Diverticulosis, Dr.John Amedeo Plenty   . DIAGNOSTIC MAMMOGRAM     Social History:   reports that she has never smoked. She has never used smokeless tobacco. She reports that she does not drink alcohol or use drugs.  History reviewed. No pertinent family history.  Medications: Patient's Medications  New Prescriptions   No medications on file  Previous Medications   ACETAMINOPHEN (TYLENOL) 325 MG TABLET    Take 325 mg by mouth every 6 (six) hours as needed for moderate pain.   ASPIRIN 81 MG TABLET    Take 81 mg by mouth daily.   ATENOLOL (TENORMIN) 25 MG TABLET    Take 1 tablet (25 mg total) by mouth at bedtime.   CHOLECALCIFEROL (VITAMIN D) 2000 UNITS TABLET    Take 2,000 Units by mouth 2 (two) times daily. Take one tablet once daily   OMEPRAZOLE (PRILOSEC) 20 MG CAPSULE    Take 1 capsule (20 mg total) by mouth daily.   SIMVASTATIN (ZOCOR) 40 MG TABLET    TAKE ONE TABLET BY MOUTH ONCE DAILY AT  6  PM  Modified Medications  No medications on file  Discontinued Medications   No medications on file     Physical Exam:  Vitals:   08/01/16 1031  BP: 134/76  Pulse: 97  Resp: 18  Temp: 97.4 F (36.3 C)  TempSrc: Oral  Weight: 156 lb 9.6 oz (71 kg)  Height: 5' (1.524 m)   Body mass index is 30.58 kg/m.  Physical Exam  Constitutional: She is oriented to person, place, and time. She appears well-developed and well-nourished. No distress.  HENT:  Head: Normocephalic and atraumatic.  Right Ear: External ear normal.  Left  Ear: External ear normal.  Nose: Nose normal.  Mouth/Throat: Oropharynx is clear and moist. No oropharyngeal exudate.  Eyes: Conjunctivae are normal. Pupils are equal, round, and reactive to light.  Neck: Normal range of motion. Neck supple.  Cardiovascular: Normal rate, regular rhythm and normal heart sounds.   Pulmonary/Chest: Effort normal and breath sounds normal.  Abdominal: Soft. Bowel sounds are normal. She exhibits no distension. There is no tenderness.  Musculoskeletal: Normal range of motion. She exhibits no edema or tenderness.  Neurological: She is alert and oriented to person, place, and time.  Skin: Skin is warm and dry. She is not diaphoretic.  Psychiatric: She has a normal mood and affect.    Labs reviewed: Basic Metabolic Panel:  Recent Labs  07/28/16 0801  NA 141  K 4.2  CL 105  CO2 26  GLUCOSE 79  BUN 17  CREATININE 0.70  CALCIUM 10.4   Liver Function Tests:  Recent Labs  07/28/16 0801  AST 17  ALT 11  ALKPHOS 60  BILITOT 0.5  PROT 6.7  ALBUMIN 4.0   No results for input(s): LIPASE, AMYLASE in the last 8760 hours. No results for input(s): AMMONIA in the last 8760 hours. CBC:  Recent Labs  07/28/16 0801  WBC 5.4  NEUTROABS 3,510  HGB 13.0  HCT 38.3  MCV 92.1  PLT 279   Lipid Panel:  Recent Labs  07/28/16 0801  CHOL 190  HDL 67  LDLCALC 100*  TRIG 116  CHOLHDL 2.8   TSH: No results for input(s): TSH in the last 8760 hours. A1C: No results found for: HGBA1C   Assessment/Plan 1. Hyperlipidemia with target LDL less than 130 -LDL slightly worse than last years, pt has also gained weight, diet has not been great -encouraged dietary modifications -to cont Statin.  - COMPLETE METABOLIC PANEL WITH GFR; Future - Lipid panel; Future  2. Gastroesophageal reflux disease without esophagitis -controlled on medications  - omeprazole (PRILOSEC) 20 MG capsule; Take 1 capsule (20 mg total) by mouth daily.  Dispense: 90 capsule; Refill:  3  3. Benign hypertensive heart disease without heart failure -blood pressure stable, cont medication as well as lifestyle modifications.  - EKG 12-Lead, unchanged  4. Osteopenia, unspecified location -conts on caltrate with D 600/400 twice a day. Dexa scan was last done 08/2014, will follow up - DG Bone Density; Future  5. Estrogen deficiency - DG Bone Density; Future  Follow up in 6 months with dr reed, blood work prior to visit.   Carlos American. Harle Battiest  [facility] [phone] 8 am - 5 pm) [phone] (after hours)

## 2016-08-02 ENCOUNTER — Encounter: Payer: Medicare Other | Admitting: Nurse Practitioner

## 2016-08-16 DIAGNOSIS — H10413 Chronic giant papillary conjunctivitis, bilateral: Secondary | ICD-10-CM | POA: Diagnosis not present

## 2016-08-16 DIAGNOSIS — H26492 Other secondary cataract, left eye: Secondary | ICD-10-CM | POA: Diagnosis not present

## 2016-08-16 DIAGNOSIS — Z961 Presence of intraocular lens: Secondary | ICD-10-CM | POA: Diagnosis not present

## 2016-08-16 DIAGNOSIS — H04123 Dry eye syndrome of bilateral lacrimal glands: Secondary | ICD-10-CM | POA: Diagnosis not present

## 2016-08-16 DIAGNOSIS — J00 Acute nasopharyngitis [common cold]: Secondary | ICD-10-CM | POA: Diagnosis not present

## 2016-08-25 DIAGNOSIS — Z1231 Encounter for screening mammogram for malignant neoplasm of breast: Secondary | ICD-10-CM | POA: Diagnosis not present

## 2016-08-25 DIAGNOSIS — M8589 Other specified disorders of bone density and structure, multiple sites: Secondary | ICD-10-CM | POA: Diagnosis not present

## 2016-08-25 LAB — HM MAMMOGRAPHY

## 2016-08-25 LAB — HM DEXA SCAN

## 2016-08-29 ENCOUNTER — Encounter: Payer: Self-pay | Admitting: *Deleted

## 2016-09-01 ENCOUNTER — Encounter: Payer: Self-pay | Admitting: *Deleted

## 2016-09-01 ENCOUNTER — Telehealth: Payer: Self-pay

## 2016-09-01 NOTE — Telephone Encounter (Signed)
I called patient to notify her that her bone density that was done on 08/25/16 did show osteopenia. Janett Billow recommended that patient continue to take calcium and vitamin D, as well as, to do wear bearing activities. Patient verbalized understanding and did not have any questions at this time.

## 2016-10-17 ENCOUNTER — Other Ambulatory Visit: Payer: Self-pay | Admitting: Nurse Practitioner

## 2016-10-24 ENCOUNTER — Other Ambulatory Visit: Payer: Self-pay | Admitting: Nurse Practitioner

## 2017-01-05 ENCOUNTER — Other Ambulatory Visit: Payer: Self-pay

## 2017-01-05 DIAGNOSIS — E785 Hyperlipidemia, unspecified: Secondary | ICD-10-CM

## 2017-01-23 ENCOUNTER — Other Ambulatory Visit: Payer: Self-pay | Admitting: Nurse Practitioner

## 2017-01-26 ENCOUNTER — Other Ambulatory Visit: Payer: Medicare Other

## 2017-01-26 DIAGNOSIS — E785 Hyperlipidemia, unspecified: Secondary | ICD-10-CM

## 2017-01-26 LAB — COMPLETE METABOLIC PANEL WITH GFR
ALT: 10 U/L (ref 6–29)
AST: 15 U/L (ref 10–35)
Albumin: 4 g/dL (ref 3.6–5.1)
Alkaline Phosphatase: 60 U/L (ref 33–130)
BUN: 14 mg/dL (ref 7–25)
CALCIUM: 9.8 mg/dL (ref 8.6–10.4)
CO2: 27 mmol/L (ref 20–31)
Chloride: 106 mmol/L (ref 98–110)
Creat: 0.71 mg/dL (ref 0.60–0.93)
GFR, EST NON AFRICAN AMERICAN: 82 mL/min (ref >=60)
Glucose, Bld: 84 mg/dL (ref 65–99)
POTASSIUM: 4.6 mmol/L (ref 3.5–5.3)
Sodium: 142 mmol/L (ref 135–146)
Total Bilirubin: 0.5 mg/dL (ref 0.2–1.2)
Total Protein: 6.3 g/dL (ref 6.1–8.1)

## 2017-01-26 LAB — LIPID PANEL
CHOL/HDL RATIO: 3.2 ratio (ref <5.0)
CHOLESTEROL: 189 mg/dL (ref <200)
HDL: 59 mg/dL (ref >50)
LDL CALC: 100 mg/dL — AB (ref <100)
Triglycerides: 151 mg/dL — ABNORMAL HIGH (ref <150)
VLDL: 30 mg/dL (ref <30)

## 2017-01-30 ENCOUNTER — Ambulatory Visit (INDEPENDENT_AMBULATORY_CARE_PROVIDER_SITE_OTHER): Payer: Medicare Other | Admitting: Internal Medicine

## 2017-01-30 ENCOUNTER — Encounter: Payer: Self-pay | Admitting: Internal Medicine

## 2017-01-30 VITALS — BP 112/70 | HR 69 | Temp 98.2°F | Wt 159.0 lb

## 2017-01-30 DIAGNOSIS — M8580 Other specified disorders of bone density and structure, unspecified site: Secondary | ICD-10-CM

## 2017-01-30 DIAGNOSIS — K219 Gastro-esophageal reflux disease without esophagitis: Secondary | ICD-10-CM | POA: Diagnosis not present

## 2017-01-30 DIAGNOSIS — E66811 Obesity, class 1: Secondary | ICD-10-CM

## 2017-01-30 DIAGNOSIS — E785 Hyperlipidemia, unspecified: Secondary | ICD-10-CM | POA: Diagnosis not present

## 2017-01-30 DIAGNOSIS — E669 Obesity, unspecified: Secondary | ICD-10-CM | POA: Diagnosis not present

## 2017-01-30 DIAGNOSIS — I119 Hypertensive heart disease without heart failure: Secondary | ICD-10-CM | POA: Diagnosis not present

## 2017-01-30 NOTE — Progress Notes (Signed)
Location:  [facility] clinic Provider:  Mililani Murthy L. Mariea Clonts, D.O., C.M.D.  Code Status: DNR Goals of Care:  Advanced Directives 01/30/2017  Does Patient Have a Medical Advance Directive? Yes  Type of Advance Directive Out of facility DNR (pink MOST or yellow form)  Copy of [place] in Chart? -  Would patient like information on creating a medical advance directive? -  Pre-existing out of facility DNR order (yellow form or pink MOST form) Yellow form placed in chart (order not valid for inpatient use)    Chief Complaint  Patient presents with  . Medical Management of Chronic Issues    39mh follow-up    HPI: Patient is a 76y.o. female seen today for medical management of chronic diseases.   Had bone density showing osteopenia.  She was counseled to take calcium with vitamin d and do weightbearing exercise--does some walking--[facility]   She is taking vitamin D 2000 units daily.    Hyperlipidemia:  Had labs with bmp wnl and LDL 100 and HDL 59.  She is on zocor 44m  She has no h/o CAD.  GERD:  Controlled on omeprazole.  Using at noon now before lunch instead.  Is trying to avoid to avoid the acidic foods and fried foods at supper.  Elevating HOB.  Had for her bday fried chicken livers and they were dangerous.  Also, if she misses, she knows it.  Has been on it for years.    BP well controlled with current regimen of atenolol alone.  She is on a baby asa.  She had her colonoscopy.  She needs no more of these.    Past Medical History:  Diagnosis Date  . Disorder of bone and cartilage, unspecified   . Disorders of bursae and tendons in shoulder region, unspecified   . Encounter for long-term (current) use of other medications   . Esophageal reflux   . Hyperosmolality and/or hypernatremia   . Osteoporosis, unspecified   . Other and unspecified hyperlipidemia   . Pain in joint, shoulder region   . Pain in limb   . Palpitations     . Pure hypercholesterolemia   . Reflux esophagitis   . Tachycardia, unspecified   . Unspecified essential hypertension   . Unspecified urinary incontinence     Past Surgical History:  Procedure Laterality Date  . APPENDECTOMY  1955  . Bone Density  08/14/2012  . CATARACT EXTRACTION Bilateral 2010  . CHOLECYSTECTOMY  2004  . COLONOSCOPY  03/31/2009   Diverticulosis, Dr.John HaAmedeo Plenty . DIAGNOSTIC MAMMOGRAM      Allergies  Allergen Reactions  . Shellfish Allergy Nausea And Vomiting    Allergies as of 01/30/2017      Reactions   Shellfish Allergy Nausea And Vomiting      Medication List       Accurate as of 01/30/17  8:44 AM. Always use your most recent med list.          acetaminophen 325 MG tablet Commonly known as:  TYLENOL Take 325 mg by mouth every 6 (six) hours as needed for moderate pain.   aspirin 81 MG tablet Take 81 mg by mouth daily.   atenolol 25 MG tablet Commonly known as:  TENORMIN TAKE 1 TABLET BY MOUTH AT BEDTIME   omeprazole 20 MG capsule Commonly known as:  PRILOSEC Take 1 capsule (20 mg total) by mouth daily.   simvastatin 40 MG tablet  Commonly known as:  ZOCOR TAKE 1 TABLET BY MOUTH ONCE DAILY AT  6  PM   Vitamin D 2000 units tablet Take 2,000 Units by mouth 2 (two) times daily. Take one tablet once daily       Review of Systems:  Review of Systems  Constitutional: Negative for chills, fever and malaise/fatigue.  HENT: Negative for congestion and hearing loss.   Eyes: Negative for blurred vision.  Respiratory: Negative for cough and shortness of breath.   Cardiovascular: Positive for leg swelling. Negative for chest pain and palpitations.       Chronic venous insufficiency  Gastrointestinal: Positive for heartburn. Negative for abdominal pain, blood in stool, constipation and melena.  Genitourinary: Negative for dysuria, frequency and urgency.  Musculoskeletal: Negative for falls and myalgias.  Skin: Negative for itching and rash.   Neurological: Negative for dizziness, loss of consciousness and weakness.  Endo/Heme/Allergies: Does not bruise/bleed easily.  Psychiatric/Behavioral: Negative for depression and memory loss.    Health Maintenance  Topic Date Due  . INFLUENZA VACCINE  04/12/2017  . MAMMOGRAM  08/25/2017  . TETANUS/TDAP  07/04/2021  . DEXA SCAN  Completed  . PNA vac Low Risk Adult  Completed    Physical Exam: Vitals:   01/30/17 0822  BP: 112/70  Pulse: 69  Temp: 98.2 F (36.8 C)  TempSrc: Oral  SpO2: 97%  Weight: 159 lb (72.1 kg)   Body mass index is 31.05 kg/m. Physical Exam  Constitutional: She is oriented to person, place, and time. She appears well-developed and well-nourished. No distress.  Cardiovascular: Normal rate, regular rhythm, normal heart sounds and intact distal pulses.   Pulmonary/Chest: Effort normal and breath sounds normal. No respiratory distress.  Abdominal: Bowel sounds are normal.  Musculoskeletal: Normal range of motion.  Neurological: She is alert and oriented to person, place, and time. No cranial nerve deficit.  Skin: Skin is warm and dry.  Psychiatric: She has a normal mood and affect.    Labs reviewed: Basic Metabolic Panel:  Recent Labs  07/28/16 0801 01/26/17 0857  NA 141 142  K 4.2 4.6  CL 105 106  CO2 26 27  GLUCOSE 79 84  BUN 17 14  CREATININE 0.70 0.71  CALCIUM 10.4 9.8   Liver Function Tests:  Recent Labs  07/28/16 0801 01/26/17 0857  AST 17 15  ALT 11 10  ALKPHOS 60 60  BILITOT 0.5 0.5  PROT 6.7 6.3  ALBUMIN 4.0 4.0   No results for input(s): LIPASE, AMYLASE in the last 8760 hours. No results for input(s): AMMONIA in the last 8760 hours. CBC:  Recent Labs  07/28/16 0801  WBC 5.4  NEUTROABS 3,510  HGB 13.0  HCT 38.3  MCV 92.1  PLT 279   Lipid Panel:  Recent Labs  07/28/16 0801 01/26/17 0857  CHOL 190 189  HDL 67 59  LDLCALC 100* 100*  TRIG 116 151*  CHOLHDL 2.8 3.2   Assessment/Plan 1. Benign  hypertensive heart disease without heart failure -bp at goal, cont atenolol which she is tolerating well  2. Gastroesophageal reflux disease without esophagitis -cont omeprazole therapy at noon, dietary modifications reviewed and elevating hob, avoiding food within 2-3 hrs of bedtime  3. Osteopenia, unspecified location -cont vitamin D therapy 2000 units and weightbearing exercise -discussed that ongoing omeprazole can affect her bones, but since she continues to have difficulty with her acid reflux, I opted to keep it anyway and she feels it is necessary  4. Hyperlipidemia with target LDL  less than 130 -cont zocor therapy, LDL at goal, no h/o CAD, cont low fat, low cholesterol diet to help with this and her exercise program  5. Obesity (BMI 30.0-34.9) -ongoing, counseled on need to at least maintain and not trend up with weight over years, is going to get back on diet--says she's been off the wagon (but the chicken livers were not the norm)  Labs/tests ordered: No orders of the defined types were placed in this encounter.  Next appt:  CPE and AWV in November as scheduled   Mekhi Sonn L. Bodi Palmeri, D.O. [facility] [address],  [address] Cell Phone (Mon-Fri 8am-5pm):  [phone] On Call:  [phone] & follow prompts after 5pm & weekends Office Phone:  [phone] Office Fax:  [phone]

## 2017-04-13 ENCOUNTER — Other Ambulatory Visit: Payer: Self-pay | Admitting: Nurse Practitioner

## 2017-05-03 DIAGNOSIS — H6123 Impacted cerumen, bilateral: Secondary | ICD-10-CM | POA: Diagnosis not present

## 2017-07-12 ENCOUNTER — Ambulatory Visit (INDEPENDENT_AMBULATORY_CARE_PROVIDER_SITE_OTHER): Payer: Medicare Other

## 2017-07-12 DIAGNOSIS — Z23 Encounter for immunization: Secondary | ICD-10-CM | POA: Diagnosis not present

## 2017-07-19 ENCOUNTER — Other Ambulatory Visit: Payer: Self-pay | Admitting: Internal Medicine

## 2017-08-07 ENCOUNTER — Ambulatory Visit (INDEPENDENT_AMBULATORY_CARE_PROVIDER_SITE_OTHER): Payer: Medicare Other

## 2017-08-07 ENCOUNTER — Encounter: Payer: Self-pay | Admitting: Nurse Practitioner

## 2017-08-07 ENCOUNTER — Ambulatory Visit (INDEPENDENT_AMBULATORY_CARE_PROVIDER_SITE_OTHER): Payer: Medicare Other | Admitting: Nurse Practitioner

## 2017-08-07 VITALS — BP 130/78 | HR 82 | Temp 97.9°F | Resp 17 | Ht 59.0 in | Wt 159.0 lb

## 2017-08-07 VITALS — BP 130/78 | HR 82 | Temp 97.9°F | Ht 59.0 in | Wt 159.0 lb

## 2017-08-07 DIAGNOSIS — Z0000 Encounter for general adult medical examination without abnormal findings: Secondary | ICD-10-CM

## 2017-08-07 DIAGNOSIS — E669 Obesity, unspecified: Secondary | ICD-10-CM

## 2017-08-07 DIAGNOSIS — I447 Left bundle-branch block, unspecified: Secondary | ICD-10-CM | POA: Diagnosis not present

## 2017-08-07 DIAGNOSIS — I119 Hypertensive heart disease without heart failure: Secondary | ICD-10-CM

## 2017-08-07 DIAGNOSIS — M8580 Other specified disorders of bone density and structure, unspecified site: Secondary | ICD-10-CM

## 2017-08-07 DIAGNOSIS — E785 Hyperlipidemia, unspecified: Secondary | ICD-10-CM

## 2017-08-07 DIAGNOSIS — E66811 Obesity, class 1: Secondary | ICD-10-CM

## 2017-08-07 LAB — CBC WITH DIFFERENTIAL/PLATELET
BASOS ABS: 32 {cells}/uL (ref 0–200)
Basophils Relative: 0.5 %
EOS ABS: 88 {cells}/uL (ref 15–500)
Eosinophils Relative: 1.4 %
HEMATOCRIT: 36.1 % (ref 35.0–45.0)
HEMOGLOBIN: 12.5 g/dL (ref 11.7–15.5)
LYMPHS ABS: 1814 {cells}/uL (ref 850–3900)
MCH: 31.3 pg (ref 27.0–33.0)
MCHC: 34.6 g/dL (ref 32.0–36.0)
MCV: 90.3 fL (ref 80.0–100.0)
MPV: 9.4 fL (ref 7.5–12.5)
Monocytes Relative: 5.4 %
NEUTROS ABS: 4026 {cells}/uL (ref 1500–7800)
NEUTROS PCT: 63.9 %
Platelets: 304 10*3/uL (ref 140–400)
RBC: 4 10*6/uL (ref 3.80–5.10)
RDW: 12.6 % (ref 11.0–15.0)
Total Lymphocyte: 28.8 %
WBC: 6.3 10*3/uL (ref 3.8–10.8)
WBCMIX: 340 {cells}/uL (ref 200–950)

## 2017-08-07 LAB — LIPID PANEL
CHOL/HDL RATIO: 3 (calc) (ref <5.0)
Cholesterol: 202 mg/dL — ABNORMAL HIGH (ref <200)
HDL: 68 mg/dL (ref >50)
LDL Cholesterol (Calc): 110 mg/dL (calc) — ABNORMAL HIGH
Non-HDL Cholesterol (Calc): 134 mg/dL (calc) — ABNORMAL HIGH (ref <130)
Triglycerides: 126 mg/dL (ref <150)

## 2017-08-07 LAB — COMPLETE METABOLIC PANEL WITH GFR
AG Ratio: 1.6 (calc) (ref 1.0–2.5)
ALKALINE PHOSPHATASE (APISO): 69 U/L (ref 33–130)
ALT: 12 U/L (ref 6–29)
AST: 17 U/L (ref 10–35)
Albumin: 4.2 g/dL (ref 3.6–5.1)
BILIRUBIN TOTAL: 0.6 mg/dL (ref 0.2–1.2)
BUN/Creatinine Ratio: 25 (calc) — ABNORMAL HIGH (ref 6–22)
BUN: 14 mg/dL (ref 7–25)
CHLORIDE: 103 mmol/L (ref 98–110)
CO2: 31 mmol/L (ref 20–32)
CREATININE: 0.55 mg/dL — AB (ref 0.60–0.93)
Calcium: 9.8 mg/dL (ref 8.6–10.4)
GFR, Est African American: 104 mL/min/{1.73_m2} (ref >=60)
GFR, Est Non African American: 90 mL/min/{1.73_m2} (ref >=60)
GLUCOSE: 86 mg/dL (ref 65–99)
Globulin: 2.7 g/dL (calc) (ref 1.9–3.7)
Potassium: 4.5 mmol/L (ref 3.5–5.3)
SODIUM: 140 mmol/L (ref 135–146)
Total Protein: 6.9 g/dL (ref 6.1–8.1)

## 2017-08-07 MED ORDER — SIMVASTATIN 40 MG PO TABS: 40.0000 mg | ORAL_TABLET | Freq: Every day | ORAL | 3 refills | Status: DC

## 2017-08-07 MED ORDER — ATENOLOL 25 MG PO TABS: 25.0000 mg | ORAL_TABLET | Freq: Every day | ORAL | 3 refills | Status: DC

## 2017-08-07 NOTE — Patient Instructions (Signed)
Brooke Cox , Thank you for taking time to come for your Medicare Wellness Visit. I appreciate your ongoing commitment to your health goals. Please review the following plan we discussed and let me know if I can assist you in the future.   Screening recommendations/referrals: Colonoscopy excluded, you are over age 78 Mammogram excluded, you are over age 14 Bone Density up to date Recommended yearly ophthalmology/optometry visit for glaucoma screening and checkup Recommended yearly dental visit for hygiene and checkup  Vaccinations: Influenza vaccine up to date. Due 2019 fall season Pneumococcal vaccine up to date. Tdap vaccine up to date. Due 07/04/2021 Shingles vaccine due, declined  Advanced directives: Please bring [place] a copy of your living will and health care power of attorney when you fill it out.  Conditions/risks identified: None  Next appointment: Sherrie Mustache, NP 08/07/2017 @ 10:45am   Preventive Care 65 Years and Older, Female Preventive care refers to lifestyle choices and visits with your health care provider that can promote health and wellness. What does preventive care include?  A yearly physical exam. This is also called an annual well check.  Dental exams once or twice a year.  Routine eye exams. Ask your health care provider how often you should have your eyes checked.  Personal lifestyle choices, including:  Daily care of your teeth and gums.  Regular physical activity.  Eating a healthy diet.  Avoiding tobacco and drug use.  Limiting alcohol use.  Practicing safe sex.  Taking low-dose aspirin every day.  Taking vitamin and mineral supplements as recommended by your health care provider. What happens during an annual well check? The services and screenings done by your health care provider during your annual well check will depend on your age, overall health, lifestyle risk factors, and family history of disease. Counseling  Your health care  provider may ask you questions about your:  Alcohol use.  Tobacco use.  Drug use.  Emotional well-being.  Home and relationship well-being.  Sexual activity.  Eating habits.  History of falls.  Memory and ability to understand (cognition).  Work and work Statistician.  Reproductive health. Screening  You may have the following tests or measurements:  Height, weight, and BMI.  Blood pressure.  Lipid and cholesterol levels. These may be checked every 5 years, or more frequently if you are over 70 years old.  Skin check.  Lung cancer screening. You may have this screening every year starting at age 86 if you have a 30-pack-year history of smoking and currently smoke or have quit within the past 15 years.  Fecal occult blood test (FOBT) of the stool. You may have this test every year starting at age 87.  Flexible sigmoidoscopy or colonoscopy. You may have a sigmoidoscopy every 5 years or a colonoscopy every 10 years starting at age 71.  Hepatitis C blood test.  Hepatitis B blood test.  Sexually transmitted disease (STD) testing.  Diabetes screening. This is done by checking your blood sugar (glucose) after you have not eaten for a while (fasting). You may have this done every 1-3 years.  Bone density scan. This is done to screen for osteoporosis. You may have this done starting at age 89.  Mammogram. This may be done every 1-2 years. Talk to your health care provider about how often you should have regular mammograms. Talk with your health care provider about your test results, treatment options, and if necessary, the need for more tests. Vaccines  Your health care provider may recommend  certain vaccines, such as:  Influenza vaccine. This is recommended every year.  Tetanus, diphtheria, and acellular pertussis (Tdap, Td) vaccine. You may need a Td booster every 10 years.  Zoster vaccine. You may need this after age 23.  Pneumococcal 13-valent conjugate (PCV13)  vaccine. One dose is recommended after age 70.  Pneumococcal polysaccharide (PPSV23) vaccine. One dose is recommended after age 30. Talk to your health care provider about which screenings and vaccines you need and how often you need them. This information is not intended to replace advice given to you by your health care provider. Make sure you discuss any questions you have with your health care provider. Document Released: 09/25/2015 Document Revised: 05/18/2016 Document Reviewed: 06/30/2015 Elsevier Interactive Patient Education  2017 [place] Prevention in the Home Falls can cause injuries. They can happen to people of all ages. There are many things you can do to make your home safe and to help prevent falls. What can I do on the outside of my home?  Regularly fix the edges of walkways and driveways and fix any cracks.  Remove anything that might make you trip as you walk through a door, such as a raised step or threshold.  Trim any bushes or trees on the path to your home.  Use bright outdoor lighting.  Clear any walking paths of anything that might make someone trip, such as rocks or tools.  Regularly check to see if handrails are loose or broken. Make sure that both sides of any steps have handrails.  Any raised decks and porches should have guardrails on the edges.  Have any leaves, snow, or ice cleared regularly.  Use sand or salt on walking paths during winter.  Clean up any spills in your garage right away. This includes oil or grease spills. What can I do in the bathroom?  Use night lights.  Install grab bars by the toilet and in the tub and shower. Do not use towel bars as grab bars.  Use non-skid mats or decals in the tub or shower.  If you need to sit down in the shower, use a plastic, non-slip stool.  Keep the floor dry. Clean up any water that spills on the floor as soon as it happens.  Remove soap buildup in the tub or shower  regularly.  Attach bath mats securely with double-sided non-slip rug tape.  Do not have throw rugs and other things on the floor that can make you trip. What can I do in the bedroom?  Use night lights.  Make sure that you have a light by your bed that is easy to reach.  Do not use any sheets or blankets that are too big for your bed. They should not hang down onto the floor.  Have a firm chair that has side arms. You can use this for support while you get dressed.  Do not have throw rugs and other things on the floor that can make you trip. What can I do in the kitchen?  Clean up any spills right away.  Avoid walking on wet floors.  Keep items that you use a lot in easy-to-reach places.  If you need to reach something above you, use a strong step stool that has a grab bar.  Keep electrical cords out of the way.  Do not use floor polish or wax that makes floors slippery. If you must use wax, use non-skid floor wax.  Do not have throw rugs and other  things on the floor that can make you trip. What can I do with my stairs?  Do not leave any items on the stairs.  Make sure that there are handrails on both sides of the stairs and use them. Fix handrails that are broken or loose. Make sure that handrails are as long as the stairways.  Check any carpeting to make sure that it is firmly attached to the stairs. Fix any carpet that is loose or worn.  Avoid having throw rugs at the top or bottom of the stairs. If you do have throw rugs, attach them to the floor with carpet tape.  Make sure that you have a light switch at the top of the stairs and the bottom of the stairs. If you do not have them, ask someone to add them for you. What else can I do to help prevent falls?  Wear shoes that:  Do not have high heels.  Have rubber bottoms.  Are comfortable and fit you well.  Are closed at the toe. Do not wear sandals.  If you use a stepladder:  Make sure that it is fully  opened. Do not climb a closed stepladder.  Make sure that both sides of the stepladder are locked into place.  Ask someone to hold it for you, if possible.  Clearly mark and make sure that you can see:  Any grab bars or handrails.  First and last steps.  Where the edge of each step is.  Use tools that help you move around (mobility aids) if they are needed. These include:  Canes.  Walkers.  Scooters.  Crutches.  Turn on the lights when you go into a dark area. Replace any light bulbs as soon as they burn out.  Set up your furniture so you have a clear path. Avoid moving your furniture around.  If any of your floors are uneven, fix them.  If there are any pets around you, be aware of where they are.  Review your medicines with your doctor. Some medicines can make you feel dizzy. This can increase your chance of falling. Ask your doctor what other things that you can do to help prevent falls. This information is not intended to replace advice given to you by your health care provider. Make sure you discuss any questions you have with your health care provider. Document Released: 06/25/2009 Document Revised: 02/04/2016 Document Reviewed: 10/03/2014 Elsevier Interactive Patient Education  2017 [employer].

## 2017-08-07 NOTE — Progress Notes (Signed)
Provider: [name], NP  Patient Care Team: [name], NP as PCP - General (Geriatric Medicine)  Extended Emergency Contact Information Primary Emergency Contact: Kuna,Dewey Address: [place] [address] [address] [place] Phone: [phone] Relation: Spouse Allergies  Allergen Reactions  . Shellfish Allergy Nausea And Vomiting   Code Status: DNR Goals of Care: Advanced Directive information Advanced Directives 08/07/2017  Does Patient Have a Medical Advance Directive? Yes  Type of Advance Directive Out of facility DNR (pink MOST or yellow form)  Does patient want to make changes to medical advance directive? No - Patient declined  Copy of [place] in Chart? -  Would patient like information on creating a medical advance directive? -  Pre-existing out of facility DNR order (yellow form or pink MOST form) Yellow form placed in chart (order not valid for inpatient use)     Chief Complaint  Patient presents with  . Medical Management of Chronic Issues    Pt is being seen for an extended visit.     HPI: Patient is a 76 y.o. female seen in today for an annual wellness exam.   Major illnesses or hospitalization in the last year- none  Depression screen [facility] 2/9 08/07/2017 08/07/2017 01/30/2017 08/01/2016 07/30/2015  Decreased Interest 0 0 0 0 0  Down, Depressed, Hopeless 0 0 0 0 0  PHQ - 2 Score 0 0 0 0 0    Fall Risk  08/07/2017 08/07/2017 01/30/2017 08/01/2016 08/01/2016  Falls in the past year? No No No No No   MMSE - Mini Mental State Exam 08/07/2017 08/01/2016 07/30/2015 07/23/2014  Orientation to time 5 5 5 5   Orientation to Place 5 5 5 5   Registration 3 3 3 3   Attention/ Calculation 5 5 5 5   Recall 2 3 3 3   Language- name 2 objects 2 2 2 2   Language- repeat 1 1 1 1   Language- follow 3 step command 3 3 3 3   Language- read & follow direction 1 1 1 1   Write a sentence 1 1 1 1   Copy  design 1 1 1 1   Total score 29 30 30 30      Health Maintenance  Topic Date Due  . MAMMOGRAM  08/25/2017  . TETANUS/TDAP  07/04/2021  . INFLUENZA VACCINE  Completed  . DEXA SCAN  Completed  . PNA vac Low Risk Adult  Completed     Past Medical History:  Diagnosis Date  . Disorder of bone and cartilage, unspecified   . Disorders of bursae and tendons in shoulder region, unspecified   . Encounter for long-term (current) use of other medications   . Esophageal reflux   . Hyperosmolality and/or hypernatremia   . Osteoporosis, unspecified   . Other and unspecified hyperlipidemia   . Pain in joint, shoulder region   . Pain in limb   . Palpitations   . Pure hypercholesterolemia   . Reflux esophagitis   . Tachycardia, unspecified   . Unspecified essential hypertension   . Unspecified urinary incontinence     Past Surgical History:  Procedure Laterality Date  . APPENDECTOMY  1955  . Bone Density  08/14/2012  . CATARACT EXTRACTION Bilateral 2010  . CHOLECYSTECTOMY  2004  . COLONOSCOPY  03/31/2009   Diverticulosis, Dr.John Amedeo Plenty   . DIAGNOSTIC MAMMOGRAM      Social History   Socioeconomic History  . Marital status: Married  Spouse name: None  . Number of children: None  . Years of education: None  . Highest education level: None  Social Needs  . Financial resource strain: Not hard at all  . Food insecurity - worry: Never true  . Food insecurity - inability: Never true  . Transportation needs - medical: No  . Transportation needs - non-medical: No  Occupational History  . None  Tobacco Use  . Smoking status: Never Smoker  . Smokeless tobacco: Never Used  Substance and Sexual Activity  . Alcohol use: No  . Drug use: No  . Sexual activity: No  Other Topics Concern  . None  Social History Narrative  . None    History reviewed. No pertinent family history.  Review of Systems:  Review of Systems  Constitutional: Negative for activity change, appetite  change, fatigue and unexpected weight change.  HENT: Negative for congestion and hearing loss.   Eyes: Negative.   Respiratory: Negative for cough and shortness of breath.   Cardiovascular: Negative for chest pain, palpitations and leg swelling.  Gastrointestinal: Negative for abdominal pain, constipation and diarrhea.  Genitourinary: Negative for difficulty urinating and dysuria.  Musculoskeletal: Negative for arthralgias and myalgias.  Skin: Negative for color change and wound.  Neurological: Negative for dizziness and weakness.  Psychiatric/Behavioral: Negative for agitation, behavioral problems and confusion.     Allergies as of 08/07/2017      Reactions   Shellfish Allergy Nausea And Vomiting      Medication List        Accurate as of 08/07/17 11:03 AM. Always use your most recent med list.          acetaminophen 325 MG tablet Commonly known as:  TYLENOL Take 325 mg by mouth every 6 (six) hours as needed for moderate pain.   aspirin 81 MG tablet Take 81 mg by mouth daily.   atenolol 25 MG tablet Commonly known as:  TENORMIN Take 1 tablet (25 mg total) by mouth at bedtime.   omeprazole 20 MG capsule Commonly known as:  PRILOSEC Take 1 capsule (20 mg total) by mouth daily.   simvastatin 40 MG tablet Commonly known as:  ZOCOR Take 1 tablet (40 mg total) by mouth daily at 6 PM.   Vitamin D 2000 units tablet Take 2,000 Units by mouth daily.         Physical Exam: Vitals:   08/07/17 1037  BP: 130/78  Pulse: 82  Resp: 17  Temp: 97.9 F (36.6 C)  TempSrc: Oral  SpO2: 95%  Weight: 159 lb (72.1 kg)  Height: 4' 11"  (1.499 m)   Body mass index is 32.11 kg/m. Physical Exam  Constitutional: She is oriented to person, place, and time. She appears well-developed and well-nourished. No distress.  Cardiovascular: Normal rate, regular rhythm, normal heart sounds and intact distal pulses.  Pulmonary/Chest: Effort normal and breath sounds normal. No respiratory  distress. She exhibits no mass. Right breast exhibits no mass. Left breast exhibits no mass.  Abdominal: Soft. Bowel sounds are normal.  Genitourinary:  Genitourinary Comments: Declined exam  Musculoskeletal: Normal range of motion.  Neurological: She is alert and oriented to person, place, and time. No cranial nerve deficit.  Skin: Skin is warm and dry.  Psychiatric: She has a normal mood and affect.    Labs reviewed: Basic Metabolic Panel: Recent Labs    01/26/17 0857  NA 142  K 4.6  CL 106  CO2 27  GLUCOSE 84  BUN 14  CREATININE  0.71  CALCIUM 9.8   Liver Function Tests: Recent Labs    01/26/17 0857  AST 15  ALT 10  ALKPHOS 60  BILITOT 0.5  PROT 6.3  ALBUMIN 4.0   No results for input(s): LIPASE, AMYLASE in the last 8760 hours. No results for input(s): AMMONIA in the last 8760 hours. CBC: No results for input(s): WBC, NEUTROABS, HGB, HCT, MCV, PLT in the last 8760 hours. Lipid Panel: Recent Labs    01/26/17 0857  CHOL 189  HDL 59  LDLCALC 100*  TRIG 151*  CHOLHDL 3.2   No results found for: HGBA1C  Procedures: No results found.  Assessment/Plan 1. Benign hypertensive heart disease without heart failure -blood pressure stable, encouraged dash diet, conts on atenolol  - EKG 12-Lead- consistent with old EKG   2. LBBB (left bundle branch block) - EKG 12-Lead-remains stable.  - CBC with Differential/Platelets  3. Osteopenia, unspecified location cont caltrate with D 600/400 twice a day. With weight bearing exercise  4. Hyperlipidemia with target LDL less than 130 -diet modifications encouraged. Cont on zocor 40 mg daily  - Lipid Panel - CMP with eGFR  5. Obesity (BMI 30.0-34.9) -encouraged weight loss and healthy lifestyle with dietary changes and increase in activity  6. Preventative health care No longer getting mammograms, last colonoscopy was normal.  MMSE 29/30.  The patient was counseled regarding regular self-examination of the breasts  on a monthly basis, prevention of dental and periodontal disease, diet, regular sustained exercise for at least 30 minutes 5 times per week,  and recommended schedule for GI hemoccult testing, colonoscopy, cholesterol, thyroid and diabetes screening. - DNR (Do Not Resuscitate)  Next appt: 6 months.  Carlos American. Harle Battiest  [facility] [phone] 8 am - 5 pm) [phone] (after hours)

## 2017-08-07 NOTE — Progress Notes (Signed)
Subjective:   Brooke Cox is a 76 y.o. female who presents for Medicare Annual (Subsequent) preventive examination.  Last AWV-07/30/2015    Objective:     Vitals: BP 130/78 (BP Location: Right Arm, Patient Position: Sitting)   Pulse 82   Temp 97.9 F (36.6 C) (Oral)   Ht 4' 11"  (1.499 m)   Wt 159 lb (72.1 kg)   SpO2 95%   BMI 32.11 kg/m   Body mass index is 32.11 kg/m.   Tobacco Social History   Tobacco Use  Smoking Status Never Smoker  Smokeless Tobacco Never Used     Counseling given: Not Answered   Past Medical History:  Diagnosis Date  . Disorder of bone and cartilage, unspecified   . Disorders of bursae and tendons in shoulder region, unspecified   . Encounter for long-term (current) use of other medications   . Esophageal reflux   . Hyperosmolality and/or hypernatremia   . Osteoporosis, unspecified   . Other and unspecified hyperlipidemia   . Pain in joint, shoulder region   . Pain in limb   . Palpitations   . Pure hypercholesterolemia   . Reflux esophagitis   . Tachycardia, unspecified   . Unspecified essential hypertension   . Unspecified urinary incontinence    Past Surgical History:  Procedure Laterality Date  . APPENDECTOMY  1955  . Bone Density  08/14/2012  . CATARACT EXTRACTION Bilateral 2010  . CHOLECYSTECTOMY  2004  . COLONOSCOPY  03/31/2009   Diverticulosis, Dr.John Amedeo Plenty   . DIAGNOSTIC MAMMOGRAM     History reviewed. No pertinent family history. Social History   Substance and Sexual Activity  Sexual Activity No    Outpatient Encounter Medications as of 08/07/2017  Medication Sig  . acetaminophen (TYLENOL) 325 MG tablet Take 325 mg by mouth every 6 (six) hours as needed for moderate pain.  Marland Kitchen aspirin 81 MG tablet Take 81 mg by mouth daily.  Marland Kitchen atenolol (TENORMIN) 25 MG tablet TAKE 1 TABLET BY MOUTH AT BEDTIME  . Cholecalciferol (VITAMIN D) 2000 UNITS tablet Take 2,000 Units by mouth daily.   Marland Kitchen omeprazole (PRILOSEC) 20 MG  capsule Take 1 capsule (20 mg total) by mouth daily.  . simvastatin (ZOCOR) 40 MG tablet TAKE 1 TABLET BY MOUTH ONCE DAILY AT 6 PM   No facility-administered encounter medications on file as of 08/07/2017.     Activities of Daily Living In your present state of health, do you have any difficulty performing the following activities: 08/07/2017  Hearing? N  Vision? N  Difficulty concentrating or making decisions? Y  Walking or climbing stairs? N  Dressing or bathing? N  Doing errands, shopping? N  Preparing Food and eating ? N  Using the Toilet? N  In the past six months, have you accidently leaked urine? N  Do you have problems with loss of bowel control? N  Managing your Medications? N  Managing your Finances? N  Housekeeping or managing your Housekeeping? N  Some recent data might be hidden    Patient Care Team: [name], NP as PCP - General (Geriatric Medicine)    Assessment:     Exercise Activities and Dietary recommendations Current Exercise Habits: Structured exercise class, Type of exercise: Other - see comments(aerobics), Time (Minutes): 30, Frequency (Times/Week): 2, Weekly Exercise (Minutes/Week): 60, Intensity: Mild, Exercise limited by: None identified  Goals    . DIET - INCREASE WATER INTAKE     Increase water to 4-5 cups a day.  Fall Risk Fall Risk  08/07/2017 01/30/2017 08/01/2016 08/01/2016 07/30/2015  Falls in the past year? No No No No No   Depression Screen PHQ 2/9 Scores 08/07/2017 01/30/2017 08/01/2016 07/30/2015  PHQ - 2 Score 0 0 0 0     Cognitive Function MMSE - Mini Mental State Exam 08/07/2017 08/01/2016 07/30/2015 07/23/2014  Orientation to time 5 5 5 5   Orientation to Place 5 5 5 5   Registration 3 3 3 3   Attention/ Calculation 5 5 5 5   Recall 2 3 3 3   Language- name 2 objects 2 2 2 2   Language- repeat 1 1 1 1   Language- follow 3 step command 3 3 3 3   Language- read & follow direction 1 1 1 1   Write a sentence 1 1 1 1     Copy design 1 1 1 1   Total score 29 30 30 30         Immunization History  Administered Date(s) Administered  . Influenza, High Dose Seasonal PF 07/12/2017  . Influenza,inj,Quad PF,6+ Mos 07/03/2013, 07/30/2015, 08/01/2016  . Influenza-Unspecified 07/03/2012, 06/12/2014  . Pneumococcal Conjugate-13 07/23/2014  . Pneumococcal Polysaccharide-23 02/20/2008  . Tdap 07/05/2011  . Zoster 07/28/2011   Screening Tests Health Maintenance  Topic Date Due  . MAMMOGRAM  08/25/2017  . TETANUS/TDAP  07/04/2021  . INFLUENZA VACCINE  Completed  . DEXA SCAN  Completed  . PNA vac Low Risk Adult  Completed      Plan:     I have personally reviewed and addressed the Medicare Annual Wellness questionnaire and have noted the following in the patient's chart:  A. Medical and social history B. Use of alcohol, tobacco or illicit drugs  C. Current medications and supplements D. Functional ability and status E.  Nutritional status F.  Physical activity G. Advance directives H. List of other physicians I.  Hospitalizations, surgeries, and ER visits in previous 12 months J.  Swartz to include hearing, vision, cognitive, depression L. Referrals and appointments - none  In addition, I have reviewed and discussed with patient certain preventive protocols, quality metrics, and best practice recommendations. A written personalized care plan for preventive services as well as general preventive health recommendations were provided to patient.  See attached scanned questionnaire for additional information.   Signed,   Rich Reining, RN Nurse Health Advisor   Quick Notes   Health Maintenance: Shingles vaccine declined. Refilled simvastatin and atenolol     Abnormal Screen: MMSE 29/30 Passed clock drawing     Patient Concerns: None     Nurse Concerns: None

## 2017-08-07 NOTE — Patient Instructions (Signed)
Increase aerobic exercise to 4-5 times a week.   DASH Eating Plan DASH stands for "Dietary Approaches to Stop Hypertension." The DASH eating plan is a healthy eating plan that has been shown to reduce high blood pressure (hypertension). It may also reduce your risk for type 2 diabetes, heart disease, and stroke. The DASH eating plan may also help with weight loss. What are tips for following this plan? General guidelines  Avoid eating more than 2,300 mg (milligrams) of salt (sodium) a day. If you have hypertension, you may need to reduce your sodium intake to 1,500 mg a day.  Limit alcohol intake to no more than 1 drink a day for nonpregnant women and 2 drinks a day for men. One drink equals 12 oz of beer, 5 oz of wine, or 1 oz of hard liquor.  Work with your health care provider to maintain a healthy body weight or to lose weight. Ask what an ideal weight is for you.  Get at least 30 minutes of exercise that causes your heart to beat faster (aerobic exercise) most days of the week. Activities may include walking, swimming, or biking.  Work with your health care provider or diet and nutrition specialist (dietitian) to adjust your eating plan to your individual calorie needs. Reading food labels  Check food labels for the amount of sodium per serving. Choose foods with less than 5 percent of the Daily Value of sodium. Generally, foods with less than 300 mg of sodium per serving fit into this eating plan.  To find whole grains, look for the word "whole" as the first word in the ingredient list. Shopping  Buy products labeled as "low-sodium" or "no salt added."  Buy fresh foods. Avoid canned foods and premade or frozen meals. Cooking  Avoid adding salt when cooking. Use salt-free seasonings or herbs instead of table salt or sea salt. Check with your health care provider or pharmacist before using salt substitutes.  Do not fry foods. Cook foods using healthy methods such as baking,  boiling, grilling, and broiling instead.  Cook with heart-healthy oils, such as olive, canola, soybean, or sunflower oil. Meal planning   Eat a balanced diet that includes: ? 5 or more servings of fruits and vegetables each day. At each meal, try to fill half of your plate with fruits and vegetables. ? Up to 6-8 servings of whole grains each day. ? Less than 6 oz of lean meat, poultry, or fish each day. A 3-oz serving of meat is about the same size as a deck of cards. One egg equals 1 oz. ? 2 servings of low-fat dairy each day. ? A serving of nuts, seeds, or beans 5 times each week. ? Heart-healthy fats. Healthy fats called Omega-3 fatty acids are found in foods such as flaxseeds and coldwater fish, like sardines, salmon, and mackerel.  Limit how much you eat of the following: ? Canned or prepackaged foods. ? Food that is high in trans fat, such as fried foods. ? Food that is high in saturated fat, such as fatty meat. ? Sweets, desserts, sugary drinks, and other foods with added sugar. ? Full-fat dairy products.  Do not salt foods before eating.  Try to eat at least 2 vegetarian meals each week.  Eat more home-cooked food and less restaurant, buffet, and fast food.  When eating at a restaurant, ask that your food be prepared with less salt or no salt, if possible. What foods are recommended? The items listed may not  be a complete list. Talk with your dietitian about what dietary choices are best for you. Grains Whole-grain or whole-wheat bread. Whole-grain or whole-wheat pasta. Brown rice. [place]. Bulgur. Whole-grain and low-sodium cereals. Pita bread. Low-fat, low-sodium crackers. Whole-wheat flour tortillas. Vegetables Fresh or frozen vegetables (raw, steamed, roasted, or grilled). Low-sodium or reduced-sodium tomato and vegetable juice. Low-sodium or reduced-sodium tomato sauce and tomato paste. Low-sodium or reduced-sodium canned vegetables. Fruits All fresh, dried, or  frozen fruit. Canned fruit in natural juice (without added sugar). Meat and other protein foods Skinless chicken or [place]. Ground chicken or [place]. Pork with fat trimmed off. Fish and seafood. Egg whites. Dried beans, peas, or lentils. Unsalted nuts, nut butters, and seeds. Unsalted canned beans. Lean cuts of beef with fat trimmed off. Low-sodium, lean deli meat. Dairy Low-fat (1%) or fat-free (skim) milk. Fat-free, low-fat, or reduced-fat cheeses. Nonfat, low-sodium ricotta or cottage cheese. Low-fat or nonfat yogurt. Low-fat, low-sodium cheese. Fats and oils Soft margarine without trans fats. Vegetable oil. Low-fat, reduced-fat, or light mayonnaise and salad dressings (reduced-sodium). Canola, safflower, olive, soybean, and sunflower oils. Avocado. Seasoning and other foods Herbs. Spices. Seasoning mixes without salt. Unsalted popcorn and pretzels. Fat-free sweets. What foods are not recommended? The items listed may not be a complete list. Talk with your dietitian about what dietary choices are best for you. Grains Baked goods made with fat, such as croissants, muffins, or some breads. Dry pasta or rice meal packs. Vegetables Creamed or fried vegetables. Vegetables in a cheese sauce. Regular canned vegetables (not low-sodium or reduced-sodium). Regular canned tomato sauce and paste (not low-sodium or reduced-sodium). Regular tomato and vegetable juice (not low-sodium or reduced-sodium). Angie Fava. Olives. Fruits Canned fruit in a light or heavy syrup. Fried fruit. Fruit in cream or butter sauce. Meat and other protein foods Fatty cuts of meat. Ribs. Fried meat. Berniece Salines. Sausage. Bologna and other processed lunch meats. Salami. Fatback. Hotdogs. Bratwurst. Salted nuts and seeds. Canned beans with added salt. Canned or smoked fish. Whole eggs or egg yolks. Chicken or [place] with skin. Dairy Whole or 2% milk, cream, and half-and-half. Whole or full-fat cream cheese. Whole-fat or sweetened yogurt.  Full-fat cheese. Nondairy creamers. Whipped toppings. Processed cheese and cheese spreads. Fats and oils Butter. Stick margarine. Lard. Shortening. Ghee. Bacon fat. Tropical oils, such as coconut, palm kernel, or palm oil. Seasoning and other foods Salted popcorn and pretzels. Onion salt, garlic salt, seasoned salt, table salt, and sea salt. Worcestershire sauce. Tartar sauce. Barbecue sauce. Teriyaki sauce. Soy sauce, including reduced-sodium. Steak sauce. Canned and packaged gravies. Fish sauce. Oyster sauce. Cocktail sauce. Horseradish that you find on the shelf. Ketchup. Mustard. Meat flavorings and tenderizers. Bouillon cubes. Hot sauce and Tabasco sauce. Premade or packaged marinades. Premade or packaged taco seasonings. Relishes. Regular salad dressings. Where to find more information:  National Heart, Lung, and [place]: https://wilson-eaton.com/  American Heart Association: www.heart.org Summary  The DASH eating plan is a healthy eating plan that has been shown to reduce high blood pressure (hypertension). It may also reduce your risk for type 2 diabetes, heart disease, and stroke.  With the DASH eating plan, you should limit salt (sodium) intake to 2,300 mg a day. If you have hypertension, you may need to reduce your sodium intake to 1,500 mg a day.  When on the DASH eating plan, aim to eat more fresh fruits and vegetables, whole grains, lean proteins, low-fat dairy, and heart-healthy fats.  Work with your health care provider or diet and nutrition  specialist (dietitian) to adjust your eating plan to your individual calorie needs. This information is not intended to replace advice given to you by your health care provider. Make sure you discuss any questions you have with your health care provider. Document Released: 08/18/2011 Document Revised: 08/22/2016 Document Reviewed: 08/22/2016 Elsevier Interactive Patient Education  2017 [employer].

## 2017-11-09 ENCOUNTER — Telehealth: Payer: Self-pay

## 2017-11-09 DIAGNOSIS — R112 Nausea with vomiting, unspecified: Secondary | ICD-10-CM | POA: Diagnosis not present

## 2017-11-09 NOTE — Telephone Encounter (Signed)
Patient called c/o vomiting, headache, and rapid pulse of 129. Symptoms onset last night. No available appointments at [facility] today.  Per [name], NP patient to be seen at Urgent Care, patient verbalized understanding and will seek medical attention today.

## 2018-01-15 ENCOUNTER — Other Ambulatory Visit: Payer: Self-pay | Admitting: Nurse Practitioner

## 2018-02-06 ENCOUNTER — Encounter: Payer: Self-pay | Admitting: Nurse Practitioner

## 2018-02-06 ENCOUNTER — Ambulatory Visit (INDEPENDENT_AMBULATORY_CARE_PROVIDER_SITE_OTHER): Payer: Medicare Other | Admitting: Nurse Practitioner

## 2018-02-06 VITALS — BP 128/74 | HR 66 | Temp 98.1°F | Ht 59.0 in | Wt 158.0 lb

## 2018-02-06 DIAGNOSIS — N3941 Urge incontinence: Secondary | ICD-10-CM | POA: Diagnosis not present

## 2018-02-06 DIAGNOSIS — M8580 Other specified disorders of bone density and structure, unspecified site: Secondary | ICD-10-CM | POA: Diagnosis not present

## 2018-02-06 DIAGNOSIS — E785 Hyperlipidemia, unspecified: Secondary | ICD-10-CM

## 2018-02-06 DIAGNOSIS — K219 Gastro-esophageal reflux disease without esophagitis: Secondary | ICD-10-CM | POA: Diagnosis not present

## 2018-02-06 DIAGNOSIS — E669 Obesity, unspecified: Secondary | ICD-10-CM

## 2018-02-06 DIAGNOSIS — I119 Hypertensive heart disease without heart failure: Secondary | ICD-10-CM

## 2018-02-06 LAB — CBC WITH DIFFERENTIAL/PLATELET
Basophils Absolute: 32 cells/uL (ref 0–200)
Basophils Relative: 0.5 %
EOS ABS: 158 {cells}/uL (ref 15–500)
Eosinophils Relative: 2.5 %
HEMATOCRIT: 35.2 % (ref 35.0–45.0)
HEMOGLOBIN: 12.2 g/dL (ref 11.7–15.5)
LYMPHS ABS: 1506 {cells}/uL (ref 850–3900)
MCH: 31 pg (ref 27.0–33.0)
MCHC: 34.7 g/dL (ref 32.0–36.0)
MCV: 89.3 fL (ref 80.0–100.0)
MONOS PCT: 7.8 %
MPV: 9.1 fL (ref 7.5–12.5)
NEUTROS ABS: 4114 {cells}/uL (ref 1500–7800)
NEUTROS PCT: 65.3 %
Platelets: 300 10*3/uL (ref 140–400)
RBC: 3.94 10*6/uL (ref 3.80–5.10)
RDW: 12.4 % (ref 11.0–15.0)
Total Lymphocyte: 23.9 %
WBC mixed population: 491 cells/uL (ref 200–950)
WBC: 6.3 10*3/uL (ref 3.8–10.8)

## 2018-02-06 LAB — COMPLETE METABOLIC PANEL WITH GFR
AG RATIO: 1.8 (calc) (ref 1.0–2.5)
ALT: 9 U/L (ref 6–29)
AST: 15 U/L (ref 10–35)
Albumin: 4.2 g/dL (ref 3.6–5.1)
Alkaline phosphatase (APISO): 65 U/L (ref 33–130)
BILIRUBIN TOTAL: 0.5 mg/dL (ref 0.2–1.2)
BUN: 16 mg/dL (ref 7–25)
CALCIUM: 10.1 mg/dL (ref 8.6–10.4)
CHLORIDE: 106 mmol/L (ref 98–110)
CO2: 31 mmol/L (ref 20–32)
Creat: 0.66 mg/dL (ref 0.60–0.93)
GFR, EST NON AFRICAN AMERICAN: 84 mL/min/{1.73_m2} (ref >=60)
GFR, Est African American: 97 mL/min/{1.73_m2} (ref >=60)
Globulin: 2.4 g/dL (calc) (ref 1.9–3.7)
Glucose, Bld: 79 mg/dL (ref 65–99)
POTASSIUM: 4.3 mmol/L (ref 3.5–5.3)
Sodium: 141 mmol/L (ref 135–146)
TOTAL PROTEIN: 6.6 g/dL (ref 6.1–8.1)

## 2018-02-06 LAB — LIPID PANEL
CHOLESTEROL: 196 mg/dL (ref <200)
HDL: 59 mg/dL (ref >50)
LDL Cholesterol (Calc): 110 mg/dL (calc) — ABNORMAL HIGH
NON-HDL CHOLESTEROL (CALC): 137 mg/dL — AB (ref <130)
TRIGLYCERIDES: 156 mg/dL — AB (ref <150)
Total CHOL/HDL Ratio: 3.3 (calc) (ref <5.0)

## 2018-02-06 NOTE — Progress Notes (Signed)
Careteam: Patient Care Team: [name], NP as PCP - General (Geriatric Medicine)  Advanced Directive information Does Patient Have a Medical Advance Directive?: Yes, Type of Advance Directive: Out of facility DNR (pink MOST or yellow form), Pre-existing out of facility DNR order (yellow form or pink MOST form): Yellow form placed in chart (order not valid for inpatient use)  Allergies  Allergen Reactions  . Shellfish Allergy Nausea And Vomiting    Chief Complaint  Patient presents with  . Medical Management of Chronic Issues    Pt is being seen for a 6 month routine visit. Pt has no concerns today.   . ACP    Has DNR- no HCPOA     HPI: Patient is a 77 y.o. female seen in the office today for routine follow up.   Osteopenia- taking calcium 600 mg twice daily and vit D 2000 units. Doing low impact aerobics 2-3 times a week.   Hyperlipidemia- taking zocor 40 mg. Has tried to cut back on carbohydrates, not cut back on fatty foods.   GERD- continues on omeprazole 20 mg with diet modifications. Has to avoid tomato based products, chocolate and fried food in the evenings or it will get worse.   HTN- controlled on atenolol. Also taking ASA daily.    Review of Systems:  Review of Systems  Constitutional: Negative for chills, fever and malaise/fatigue.  HENT: Negative for congestion and hearing loss.   Eyes: Negative for blurred vision.  Respiratory: Negative for cough and shortness of breath.   Cardiovascular: Negative for chest pain, palpitations and leg swelling.       Chronic venous insufficiency  Gastrointestinal: Positive for heartburn. Negative for abdominal pain, blood in stool, constipation and melena.  Genitourinary: Negative for dysuria, frequency and urgency.       Small amounts of incontinence, not able to make it to the restroom in time.  Also with OAB  Musculoskeletal: Negative for falls and myalgias.  Skin: Negative for itching and rash.    Neurological: Negative for dizziness, loss of consciousness and weakness.  Endo/Heme/Allergies: Does not bruise/bleed easily.  Psychiatric/Behavioral: Negative for depression and memory loss.    Past Medical History:  Diagnosis Date  . Disorder of bone and cartilage, unspecified   . Disorders of bursae and tendons in shoulder region, unspecified   . Encounter for long-term (current) use of other medications   . Esophageal reflux   . Hyperosmolality and/or hypernatremia   . Osteoporosis, unspecified   . Other and unspecified hyperlipidemia   . Pain in joint, shoulder region   . Pain in limb   . Palpitations   . Pure hypercholesterolemia   . Reflux esophagitis   . Tachycardia, unspecified   . Unspecified essential hypertension   . Unspecified urinary incontinence    Past Surgical History:  Procedure Laterality Date  . APPENDECTOMY  1955  . Bone Density  08/14/2012  . CATARACT EXTRACTION Bilateral 2010  . CHOLECYSTECTOMY  2004  . COLONOSCOPY  03/31/2009   Diverticulosis, Dr.John Amedeo Plenty   . DIAGNOSTIC MAMMOGRAM     Social History:   reports that she has never smoked. She has never used smokeless tobacco. She reports that she does not drink alcohol or use drugs.  History reviewed. No pertinent family history.  Medications: Patient's Medications  New Prescriptions   No medications on file  Previous Medications   ACETAMINOPHEN (TYLENOL) 325 MG TABLET    Take 325 mg by mouth every 6 (six) hours as  needed for moderate pain.   ASPIRIN 81 MG TABLET    Take 81 mg by mouth daily.   ATENOLOL (TENORMIN) 25 MG TABLET    Take 1 tablet (25 mg total) by mouth at bedtime.   CHOLECALCIFEROL (VITAMIN D) 2000 UNITS TABLET    Take 2,000 Units by mouth daily.    OMEPRAZOLE (PRILOSEC) 20 MG CAPSULE    Take 1 capsule (20 mg total) by mouth daily.   SIMVASTATIN (ZOCOR) 40 MG TABLET     TAKE ONE TABLET BY MOUTH ONCE DAILY AT 6 PM.  Modified Medications   No medications on file  Discontinued  Medications   No medications on file     Physical Exam:  Vitals:   02/06/18 0831  BP: 128/74  Pulse: 66  Temp: 98.1 F (36.7 C)  TempSrc: Oral  SpO2: 98%  Weight: 158 lb (71.7 kg)  Height: 4' 11"  (1.499 m)   Body mass index is 31.91 kg/m.  Physical Exam  Constitutional: She is oriented to person, place, and time. She appears well-developed and well-nourished. No distress.  Cardiovascular: Normal rate, regular rhythm, normal heart sounds and intact distal pulses.  Pulmonary/Chest: Effort normal and breath sounds normal. No respiratory distress.  Abdominal: Bowel sounds are normal.  Musculoskeletal: Normal range of motion.  Neurological: She is alert and oriented to person, place, and time. No cranial nerve deficit.  Skin: Skin is warm and dry.  Psychiatric: She has a normal mood and affect.    Labs reviewed: Basic Metabolic Panel: Recent Labs    08/07/17 1131  NA 140  K 4.5  CL 103  CO2 31  GLUCOSE 86  BUN 14  CREATININE 0.55*  CALCIUM 9.8   Liver Function Tests: Recent Labs    08/07/17 1131  AST 17  ALT 12  BILITOT 0.6  PROT 6.9   No results for input(s): LIPASE, AMYLASE in the last 8760 hours. No results for input(s): AMMONIA in the last 8760 hours. CBC: Recent Labs    08/07/17 1131  WBC 6.3  NEUTROABS 4,026  HGB 12.5  HCT 36.1  MCV 90.3  PLT 304   Lipid Panel: Recent Labs    08/07/17 1131  CHOL 202*  HDL 68  LDLCALC 110*  TRIG 126  CHOLHDL 3.0   TSH: No results for input(s): TSH in the last 8760 hours. A1C: No results found for: HGBA1C   Assessment/Plan 1. Hyperlipidemia with target LDL less than 130 -discussed dietary modifications, to continue zocor.  - Lipid Panel - COMPLETE METABOLIC PANEL WITH GFR  2. Obesity (BMI 30.0-34.9) -to continue low carb diet but also dicussed low fat, continue with exercise. 30 mins 5 days a week recommended.   3. Osteopenia, unspecified location -to continue calcium with vit d with weight  bearing activity.   4. Gastroesophageal reflux disease without esophagitis -stable, continue with dietary modifications with omeprazole.   5. Benign hypertensive heart disease without heart failure -stable on atenolol and ASA -continue lifestyle modifications - CBC with Differential/Platelets  6. Urge incontinence of urine Small amounts of leakage noted, Encouraged scheduled toileting.   Next appt: 6 months for physical with lab work prior to visit.  Carlos American. [place], [facility] [phone]

## 2018-02-06 NOTE — ACP (Advance Care Planning) (Signed)
DNR on file, but without advanced directives. Paperwork given to pt during visit. Discussed with pt, without questions at this time.

## 2018-02-08 ENCOUNTER — Emergency Department (HOSPITAL_COMMUNITY): Payer: Medicare Other

## 2018-02-08 ENCOUNTER — Encounter (HOSPITAL_COMMUNITY): Payer: Self-pay | Admitting: Emergency Medicine

## 2018-02-08 ENCOUNTER — Inpatient Hospital Stay (HOSPITAL_COMMUNITY)
Admission: EM | Admit: 2018-02-08 | Discharge: 2018-02-19 | DRG: 336 | Disposition: A | Discharge status: Home / Self Care | Payer: Medicare Other | Attending: Internal Medicine | Admitting: Internal Medicine

## 2018-02-08 ENCOUNTER — Inpatient Hospital Stay (HOSPITAL_COMMUNITY): Payer: Medicare Other

## 2018-02-08 ENCOUNTER — Other Ambulatory Visit: Payer: Self-pay

## 2018-02-08 DIAGNOSIS — K5651 Intestinal adhesions [bands], with partial obstruction: Secondary | ICD-10-CM | POA: Diagnosis not present

## 2018-02-08 DIAGNOSIS — D72829 Elevated white blood cell count, unspecified: Secondary | ICD-10-CM | POA: Diagnosis not present

## 2018-02-08 DIAGNOSIS — R9389 Abnormal findings on diagnostic imaging of other specified body structures: Secondary | ICD-10-CM | POA: Diagnosis present

## 2018-02-08 DIAGNOSIS — I5032 Chronic diastolic (congestive) heart failure: Secondary | ICD-10-CM | POA: Diagnosis not present

## 2018-02-08 DIAGNOSIS — I119 Hypertensive heart disease without heart failure: Secondary | ICD-10-CM | POA: Diagnosis present

## 2018-02-08 DIAGNOSIS — K5000 Crohn's disease of small intestine without complications: Secondary | ICD-10-CM

## 2018-02-08 DIAGNOSIS — Z6832 Body mass index (BMI) 32.0-32.9, adult: Secondary | ICD-10-CM | POA: Diagnosis not present

## 2018-02-08 DIAGNOSIS — R112 Nausea with vomiting, unspecified: Secondary | ICD-10-CM

## 2018-02-08 DIAGNOSIS — K219 Gastro-esophageal reflux disease without esophagitis: Secondary | ICD-10-CM | POA: Diagnosis present

## 2018-02-08 DIAGNOSIS — R1084 Generalized abdominal pain: Secondary | ICD-10-CM

## 2018-02-08 DIAGNOSIS — I110 Hypertensive heart disease with heart failure: Secondary | ICD-10-CM | POA: Diagnosis present

## 2018-02-08 DIAGNOSIS — R0989 Other specified symptoms and signs involving the circulatory and respiratory systems: Secondary | ICD-10-CM

## 2018-02-08 DIAGNOSIS — I440 Atrioventricular block, first degree: Secondary | ICD-10-CM | POA: Diagnosis not present

## 2018-02-08 DIAGNOSIS — R1013 Epigastric pain: Secondary | ICD-10-CM | POA: Diagnosis not present

## 2018-02-08 DIAGNOSIS — R05 Cough: Secondary | ICD-10-CM | POA: Diagnosis not present

## 2018-02-08 DIAGNOSIS — Z9049 Acquired absence of other specified parts of digestive tract: Secondary | ICD-10-CM | POA: Diagnosis not present

## 2018-02-08 DIAGNOSIS — Z0189 Encounter for other specified special examinations: Secondary | ICD-10-CM

## 2018-02-08 DIAGNOSIS — Z7982 Long term (current) use of aspirin: Secondary | ICD-10-CM | POA: Diagnosis not present

## 2018-02-08 DIAGNOSIS — K210 Gastro-esophageal reflux disease with esophagitis: Secondary | ICD-10-CM | POA: Diagnosis present

## 2018-02-08 DIAGNOSIS — M810 Age-related osteoporosis without current pathological fracture: Secondary | ICD-10-CM | POA: Diagnosis present

## 2018-02-08 DIAGNOSIS — Z9841 Cataract extraction status, right eye: Secondary | ICD-10-CM

## 2018-02-08 DIAGNOSIS — K56600 Partial intestinal obstruction, unspecified as to cause: Secondary | ICD-10-CM | POA: Diagnosis not present

## 2018-02-08 DIAGNOSIS — Z79899 Other long term (current) drug therapy: Secondary | ICD-10-CM

## 2018-02-08 DIAGNOSIS — E785 Hyperlipidemia, unspecified: Secondary | ICD-10-CM | POA: Diagnosis present

## 2018-02-08 DIAGNOSIS — Z4659 Encounter for fitting and adjustment of other gastrointestinal appliance and device: Secondary | ICD-10-CM

## 2018-02-08 DIAGNOSIS — K5730 Diverticulosis of large intestine without perforation or abscess without bleeding: Secondary | ICD-10-CM | POA: Diagnosis not present

## 2018-02-08 DIAGNOSIS — K760 Fatty (change of) liver, not elsewhere classified: Secondary | ICD-10-CM | POA: Diagnosis present

## 2018-02-08 DIAGNOSIS — K50019 Crohn's disease of small intestine with unspecified complications: Secondary | ICD-10-CM | POA: Diagnosis not present

## 2018-02-08 DIAGNOSIS — R933 Abnormal findings on diagnostic imaging of other parts of digestive tract: Secondary | ICD-10-CM | POA: Diagnosis not present

## 2018-02-08 DIAGNOSIS — Z66 Do not resuscitate: Secondary | ICD-10-CM | POA: Diagnosis present

## 2018-02-08 DIAGNOSIS — Z91013 Allergy to seafood: Secondary | ICD-10-CM

## 2018-02-08 DIAGNOSIS — K5669 Other partial intestinal obstruction: Secondary | ICD-10-CM | POA: Diagnosis not present

## 2018-02-08 DIAGNOSIS — K50012 Crohn's disease of small intestine with intestinal obstruction: Secondary | ICD-10-CM | POA: Diagnosis present

## 2018-02-08 DIAGNOSIS — E669 Obesity, unspecified: Secondary | ICD-10-CM | POA: Diagnosis present

## 2018-02-08 DIAGNOSIS — I447 Left bundle-branch block, unspecified: Secondary | ICD-10-CM | POA: Diagnosis present

## 2018-02-08 DIAGNOSIS — Z4682 Encounter for fitting and adjustment of non-vascular catheter: Secondary | ICD-10-CM | POA: Diagnosis not present

## 2018-02-08 DIAGNOSIS — Z9842 Cataract extraction status, left eye: Secondary | ICD-10-CM | POA: Diagnosis not present

## 2018-02-08 DIAGNOSIS — E876 Hypokalemia: Secondary | ICD-10-CM | POA: Diagnosis present

## 2018-02-08 DIAGNOSIS — K449 Diaphragmatic hernia without obstruction or gangrene: Secondary | ICD-10-CM | POA: Diagnosis not present

## 2018-02-08 DIAGNOSIS — R1110 Vomiting, unspecified: Secondary | ICD-10-CM

## 2018-02-08 DIAGNOSIS — R0602 Shortness of breath: Secondary | ICD-10-CM | POA: Diagnosis not present

## 2018-02-08 DIAGNOSIS — K56609 Unspecified intestinal obstruction, unspecified as to partial versus complete obstruction: Secondary | ICD-10-CM

## 2018-02-08 DIAGNOSIS — K56699 Other intestinal obstruction unspecified as to partial versus complete obstruction: Secondary | ICD-10-CM | POA: Diagnosis not present

## 2018-02-08 DIAGNOSIS — I10 Essential (primary) hypertension: Secondary | ICD-10-CM | POA: Diagnosis not present

## 2018-02-08 LAB — URINALYSIS, ROUTINE W REFLEX MICROSCOPIC
BILIRUBIN URINE: NEGATIVE
Glucose, UA: 50 mg/dL — AB
HGB URINE DIPSTICK: NEGATIVE
KETONES UR: 20 mg/dL — AB
Nitrite: NEGATIVE
PROTEIN: NEGATIVE mg/dL
Specific Gravity, Urine: 1.017 (ref 1.005–1.030)
pH: 7 (ref 5.0–8.0)

## 2018-02-08 LAB — COMPREHENSIVE METABOLIC PANEL
ALBUMIN: 3.8 g/dL (ref 3.5–5.0)
ALT: 13 U/L — ABNORMAL LOW (ref 14–54)
AST: 16 U/L (ref 15–41)
Alkaline Phosphatase: 59 U/L (ref 38–126)
Anion gap: 10 (ref 5–15)
BUN: 19 mg/dL (ref 6–20)
CHLORIDE: 106 mmol/L (ref 101–111)
CO2: 23 mmol/L (ref 22–32)
Calcium: 9.9 mg/dL (ref 8.9–10.3)
Creatinine, Ser: 0.78 mg/dL (ref 0.44–1.00)
GFR calc Af Amer: >60 mL/min (ref >60)
Glucose, Bld: 156 mg/dL — ABNORMAL HIGH (ref 65–99)
POTASSIUM: 3.1 mmol/L — AB (ref 3.5–5.1)
Sodium: 139 mmol/L (ref 135–145)
Total Bilirubin: 0.5 mg/dL (ref 0.3–1.2)
Total Protein: 6.6 g/dL (ref 6.5–8.1)

## 2018-02-08 LAB — CBC WITH DIFFERENTIAL/PLATELET
BASOS ABS: 0 10*3/uL (ref 0.0–0.1)
BASOS PCT: 0 %
EOS ABS: 0 10*3/uL (ref 0.0–0.7)
EOS PCT: 0 %
HCT: 35 % — ABNORMAL LOW (ref 36.0–46.0)
Hemoglobin: 11.7 g/dL — ABNORMAL LOW (ref 12.0–15.0)
Lymphocytes Relative: 12 %
Lymphs Abs: 1.2 10*3/uL (ref 0.7–4.0)
MCH: 30.8 pg (ref 26.0–34.0)
MCHC: 33.4 g/dL (ref 30.0–36.0)
MCV: 92.1 fL (ref 78.0–100.0)
MONO ABS: 0.4 10*3/uL (ref 0.1–1.0)
Monocytes Relative: 3 %
Neutro Abs: 8.6 10*3/uL — ABNORMAL HIGH (ref 1.7–7.7)
Neutrophils Relative %: 85 %
PLATELETS: 271 10*3/uL (ref 150–400)
RBC: 3.8 MIL/uL — ABNORMAL LOW (ref 3.87–5.11)
RDW: 13 % (ref 11.5–15.5)
WBC: 10.3 10*3/uL (ref 4.0–10.5)

## 2018-02-08 LAB — I-STAT CG4 LACTIC ACID, ED: Lactic Acid, Venous: 2.06 mmol/L (ref 0.5–1.9)

## 2018-02-08 LAB — LIPASE, BLOOD: Lipase: 22 U/L (ref 11–51)

## 2018-02-08 MED ORDER — HYDROMORPHONE HCL 1 MG/ML IJ SOLN
1.0000 mg | Freq: Once | INTRAMUSCULAR | Status: AC
  Administered 2018-02-08: 1 mg via INTRAVENOUS
  Filled 2018-02-08: qty 1

## 2018-02-08 MED ORDER — SODIUM CHLORIDE 0.9 % IV BOLUS
500.0000 mL | Freq: Once | INTRAVENOUS | Status: AC
  Administered 2018-02-08: 500 mL via INTRAVENOUS

## 2018-02-08 MED ORDER — METOPROLOL TARTRATE 5 MG/5ML IV SOLN
2.5000 mg | Freq: Three times a day (TID) | INTRAVENOUS | Status: DC
  Administered 2018-02-08 – 2018-02-09 (×4): 2.5 mg via INTRAVENOUS
  Filled 2018-02-08 (×4): qty 5

## 2018-02-08 MED ORDER — HYDROMORPHONE HCL 1 MG/ML IJ SOLN
0.5000 mg | INTRAMUSCULAR | Status: DC | PRN
  Administered 2018-02-08 – 2018-02-10 (×8): 0.5 mg via INTRAVENOUS
  Filled 2018-02-08 (×8): qty 0.5

## 2018-02-08 MED ORDER — SODIUM CHLORIDE 0.9 % IV SOLN
3.0000 g | Freq: Four times a day (QID) | INTRAVENOUS | Status: DC
  Administered 2018-02-08 – 2018-02-09 (×5): 3 g via INTRAVENOUS
  Filled 2018-02-08 (×5): qty 3

## 2018-02-08 MED ORDER — IOPAMIDOL (ISOVUE-370) INJECTION 76%
INTRAVENOUS | Status: AC
  Filled 2018-02-08: qty 100

## 2018-02-08 MED ORDER — ONDANSETRON HCL 4 MG/2ML IJ SOLN
4.0000 mg | Freq: Once | INTRAMUSCULAR | Status: AC
  Administered 2018-02-08: 4 mg via INTRAVENOUS
  Filled 2018-02-08: qty 2

## 2018-02-08 MED ORDER — HYDRALAZINE HCL 20 MG/ML IJ SOLN: 5.0000 mg | Freq: Four times a day (QID) | INTRAMUSCULAR | Status: DC | PRN

## 2018-02-08 MED ORDER — DEXTROSE-NACL 5-0.45 % IV SOLN
INTRAVENOUS | Status: DC
  Administered 2018-02-08 – 2018-02-10 (×3): via INTRAVENOUS

## 2018-02-08 MED ORDER — IOPAMIDOL (ISOVUE-370) INJECTION 76%
100.0000 mL | Freq: Once | INTRAVENOUS | Status: AC | PRN
  Administered 2018-02-08: 100 mL via INTRAVENOUS

## 2018-02-08 MED ORDER — HYDROMORPHONE HCL 1 MG/ML IJ SOLN
1.0000 mg | Freq: Once | INTRAMUSCULAR | Status: AC
Start: 2018-02-08 — End: 2018-02-08
  Administered 2018-02-08: 1 mg via INTRAVENOUS
  Filled 2018-02-08: qty 1

## 2018-02-08 MED ORDER — ENOXAPARIN SODIUM 40 MG/0.4ML ~~LOC~~ SOLN
40.0000 mg | SUBCUTANEOUS | Status: DC
  Administered 2018-02-08 – 2018-02-18 (×11): 40 mg via SUBCUTANEOUS
  Filled 2018-02-08 (×11): qty 0.4

## 2018-02-08 NOTE — ED Notes (Signed)
ED TO INPATIENT HANDOFF REPORT  Name/Age/Gender Brooke Cox Pulse 77 y.o. female  Code Status Code Status History    Date Active Date Inactive Code Status Order ID Comments User Context   08/07/2017 1123 02/08/2018 0331 DNR [id number]  [name], NP Outpatient    Questions for Most Recent Historical Code Status (Order [id number])    Question Answer Comment   In the event of cardiac or respiratory ARREST Do not call a "code blue"    In the event of cardiac or respiratory ARREST Do not perform Intubation, CPR, defibrillation or ACLS    In the event of cardiac or respiratory ARREST Use medication by any route, position, wound care, and other measures to relive pain and suffering. May use oxygen, suction and manual treatment of airway obstruction as needed for comfort.       Home/SNF/Other Home  Chief Complaint Abdominal pain,Nausea, vomiting  Level of Care/Admitting Diagnosis ED Disposition    ED Disposition Condition [facility] Area: [place] [100102]  Level of Care: Med-Surg [16]  Diagnosis: Partial small bowel obstruction [facility]) [[id number]]  Admitting Physician: Mariel Aloe [phone]  Attending Physician: Mariel Aloe [phone]  Estimated length of stay: past midnight tomorrow  Certification:: I certify this patient will need inpatient services for at least 2 midnights  PT Class (Do Not Modify): Inpatient [101]  PT Acc Code (Do Not Modify): Private [1]       Medical History Past Medical History:  Diagnosis Date  . Disorder of bone and cartilage, unspecified   . Disorders of bursae and tendons in shoulder region, unspecified   . Encounter for long-term (current) use of other medications   . Esophageal reflux   . Hyperosmolality and/or hypernatremia   . Osteoporosis, unspecified   . Other and unspecified hyperlipidemia   . Pain in joint, shoulder region   . Pain in limb   . Palpitations   . Pure hypercholesterolemia   . Reflux  esophagitis   . Tachycardia, unspecified   . Unspecified essential hypertension   . Unspecified urinary incontinence     Allergies Allergies  Allergen Reactions  . Shellfish Allergy Nausea And Vomiting    IV Location/Drains/Wounds Patient Lines/Drains/Airways Status   Active Line/Drains/Airways    Name:   Placement date:   Placement time:   Site:   Days:   Peripheral IV 02/08/18 Left Antecubital   02/08/18    0400    Antecubital   less than 1          Labs/Imaging Results for orders placed or performed during the hospital encounter of 02/08/18 (from the past 48 hour(s))  CBC with Differential/Platelet     Status: Abnormal   Collection Time: 02/08/18  5:00 AM  Result Value Ref Range   WBC 10.3 4.0 - 10.5 K/uL   RBC 3.80 (L) 3.87 - 5.11 MIL/uL   Hemoglobin 11.7 (L) 12.0 - 15.0 g/dL   HCT 35.0 (L) 36.0 - 46.0 %   MCV 92.1 78.0 - 100.0 fL   MCH 30.8 26.0 - 34.0 pg   MCHC 33.4 30.0 - 36.0 g/dL   RDW 13.0 11.5 - 15.5 %   Platelets 271 150 - 400 K/uL   Neutrophils Relative % 85 %   Neutro Abs 8.6 (H) 1.7 - 7.7 K/uL   Lymphocytes Relative 12 %   Lymphs Abs 1.2 0.7 - 4.0 K/uL   Monocytes Relative 3 %   Monocytes Absolute 0.4 0.1 -  1.0 K/uL   Eosinophils Relative 0 %   Eosinophils Absolute 0.0 0.0 - 0.7 K/uL   Basophils Relative 0 %   Basophils Absolute 0.0 0.0 - 0.1 K/uL    Comment: Performed at [facility], [place] [address], [place], [place] [address]  Comprehensive metabolic panel     Status: Abnormal   Collection Time: 02/08/18  5:00 AM  Result Value Ref Range   Sodium 139 135 - 145 mmol/L   Potassium 3.1 (L) 3.5 - 5.1 mmol/L   Chloride 106 101 - 111 mmol/L   CO2 23 22 - 32 mmol/L   Glucose, Bld 156 (H) 65 - 99 mg/dL   BUN 19 6 - 20 mg/dL   Creatinine, Ser 0.78 0.44 - 1.00 mg/dL   Calcium 9.9 8.9 - 10.3 mg/dL   Total Protein 6.6 6.5 - 8.1 g/dL   Albumin 3.8 3.5 - 5.0 g/dL   AST 16 15 - 41 U/L   ALT 13 (L) 14 - 54 U/L   Alkaline Phosphatase 59  38 - 126 U/L   Total Bilirubin 0.5 0.3 - 1.2 mg/dL   GFR calc non Af Amer >60 >60 mL/min   GFR calc Af Amer >60 >60 mL/min    Comment: (NOTE) The eGFR has been calculated using the CKD EPI equation. This calculation has not been validated in all clinical situations. eGFR's persistently <60 mL/min signify possible Chronic Kidney Disease.    Anion gap 10 5 - 15    Comment: Performed at [facility], [place] [address] [place], [place] [address]  Lipase, blood     Status: None   Collection Time: 02/08/18  5:00 AM  Result Value Ref Range   Lipase 22 11 - 51 U/L    Comment: Performed at [facility], [place] [address] [place], [place] [address]  I-Stat CG4 Lactic Acid, ED     Status: Abnormal   Collection Time: 02/08/18  5:36 AM  Result Value Ref Range   Lactic Acid, Venous 2.06 (HH) 0.5 - 1.9 mmol/L   Comment NOTIFIED PHYSICIAN   Urinalysis, Routine w reflex microscopic     Status: Abnormal   Collection Time: 02/08/18  6:10 AM  Result Value Ref Range   Color, Urine YELLOW YELLOW   APPearance HAZY (A) CLEAR   Specific Gravity, Urine 1.017 1.005 - 1.030   pH 7.0 5.0 - 8.0   Glucose, UA 50 (A) NEGATIVE mg/dL   Hgb urine dipstick NEGATIVE NEGATIVE   Bilirubin Urine NEGATIVE NEGATIVE   Ketones, ur 20 (A) NEGATIVE mg/dL   Protein, ur NEGATIVE NEGATIVE mg/dL   Nitrite NEGATIVE NEGATIVE   Leukocytes, UA TRACE (A) NEGATIVE   RBC / HPF 0-5 0 - 5 RBC/hpf   WBC, UA 0-5 0 - 5 WBC/hpf   Bacteria, UA RARE (A) NONE SEEN   Squamous Epithelial / LPF 0-5 0 - 5    Comment: Performed at [facility], [place] [address] [place], [place] [address]   Ct Angio Abd/pel W And/or Wo Contrast  Result Date: 02/08/2018 EXAM: CTA ABDOMEN AND PELVIS wITHOUT AND WITH CONTRAST TECHNIQUE: Multidetector CT imaging of the abdomen and pelvis was performed using the standard protocol during bolus administration of intravenous contrast. Multiplanar reconstructed images  and MIPs were obtained and reviewed to evaluate the vascular anatomy. CONTRAST:  167m ISOVUE-370 IOPAMIDOL (ISOVUE-370) INJECTION 76% COMPARISON:  None. FINDINGS: VASCULAR Aorta: Nonaneurysmal and patent with scattered atherosclerotic calcification. 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 Branches reconstitute via pancreatic collateral vessels. Branch vessels are patent. No obvious visceral artery aneurysm within the branches. SMA: Patent.  Replaced right hepatic artery anatomy. Renals: Single renal arteries are patent. IMA: Patent and diminutive. Inflow: Bilateral common iliac, external iliac, and internal iliac arteries are patent. Mild atherosclerotic calcifications are noted. Proximal Outflow: Grossly patent. Veins: No evidence of DVT. Review of the MIP images confirms the above findings. NON-VASCULAR Lower chest: Bibasilar dependent atelectasis versus consolidation right greater than left. Hepatobiliary: Postcholecystectomy. Diffuse hepatic steatosis. There is a 3.1 cm low-density lesion within the medial segment of the left lobe of the liver centrally. It is characterized by peripheral discontinuous puddling on venous phase images. This is most consistent with a benign hemangioma. There is an avidly enhancing lesion in the right lobe of the liver measuring 11 mm on image 33 which is nonspecific but may represent a hemangioma. Pancreas: Unremarkable Spleen: Unremarkable Adrenals/Urinary Tract: Adrenal glands are within normal limits. Mild cortical atrophy of the kidneys. Decompressed bladder. Stomach/Bowel: Post appendectomy staples are noted. No obvious mass in the colon. There is a segment of distal small bowel in the right lower quadrant with wall thickening and inflammatory change in the adjacent fat. Mildly dilated small bowel loops with air-fluid levels are present proximal to this area. There is no pneumatosis or extraluminal bowel gas. There is a small  amount of free fluid within the adjacent right paracolic gutter. Large hiatal hernia is present. Duodenum is unremarkable. Sigmoid diverticulosis. Lymphatic: No abnormal retroperitoneal adenopathy. Reproductive: The endometrial stripe in the fundus of the uterus is markedly thickened measuring 15 mm. Adnexa are unremarkable. Other: Small amount of free fluid layers in the pelvis. Musculoskeletal: No vertebral compression. Degenerative change in the lower lumbar spine. 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 Branches reconstitute via pancreatic collaterals from the SMA. SMA and IMA are patent. No acute vascular pathology. NON-VASCULAR There is a segment of small bowel wall thickening and inflammatory change in the right lower quadrant. This results in an element of partial small bowel obstruction and dilated proximal small bowel loops. Differential diagnosis includes infection, inflammatory bowel disease, and ischemia. There is no obvious evidence of a internal hernia or torsion of small bowel. Large hiatal hernia. Thickening of the endometrial stripe of the uterus. Pathology cannot be excluded. Ultrasound is warranted. There are 2 lesions in the liver as described. They most likely represent benign entities such as hemangiomata. If there is a history of malignancy or high risk of malignancy, six-month follow-up MRI is recommended. Electronically Signed   By: Marybelle Killings M.D.   On: 02/08/2018 08:28    Pending Labs Unresulted Labs (From admission, onward)   Start     Ordered   02/08/18 1034  Occult blood card to lab, stool RN will collect  Once,   R    Question:  Specimen to be collected by?  Answer:  RN will collect   02/08/18 1033   Signed and Held  Creatinine, serum  (enoxaparin (LOVENOX)    CrCl >/= 30 ml/min)  Weekly,   R    Comments:  while on enoxaparin therapy    Signed and Held   Signed and Held  Basic metabolic panel  Tomorrow morning,   R     Signed and Held   Signed and Held   CBC  Tomorrow morning,   R     Signed and Held   Signed  and Held  Gastrointestinal Panel by PCR , Stool  (Gastrointestinal Panel by PCR, Stool)  Once,   R     Signed and Held      Vitals/Pain Today's Vitals   02/08/18 1138 02/08/18 1145 02/08/18 1200 02/08/18 1215  BP:  (!) 152/77 (!) 161/84 (!) 164/85  Pulse:  83 84 87  Resp:  _0 Temp:      TempSrc:      SpO2:  97% 98% 98%  Weight:      Height:      PainSc: 5        Isolation Precautions No active isolations  Medications Medications  iopamidol (ISOVUE-370) 76 % injection (has no administration in time range)  metoprolol tartrate (LOPRESSOR) injection 2.5 mg (has no administration in time range)  hydrALAZINE (APRESOLINE) injection 5 mg (has no administration in time range)  dextrose 5 %-0.45 % sodium chloride infusion ( Intravenous New Bag/Given 02/08/18 1138)  Ampicillin-Sulbactam (UNASYN) 3 g in sodium chloride 0.9 % 100 mL IVPB (3 g Intravenous New Bag/Given 02/08/18 1138)  sodium chloride 0.9 % bolus 500 mL (0 mLs Intravenous Stopped 02/08/18 0542)  HYDROmorphone (DILAUDID) injection 1 mg (1 mg Intravenous Given 02/08/18 0438)  ondansetron (ZOFRAN) injection 4 mg (4 mg Intravenous Given 02/08/18 0439)  iopamidol (ISOVUE-370) 76 % injection 100 mL (100 mLs Intravenous Contrast Given 02/08/18 0648)  HYDROmorphone (DILAUDID) injection 1 mg (1 mg Intravenous Given 02/08/18 0857)    Mobility walks with person assist

## 2018-02-08 NOTE — ED Provider Notes (Signed)
[facility] DEPT Provider Note   CSN: [id number] Arrival date & time: 02/08/18  0331     History   Chief Complaint Chief Complaint  Patient presents with  . Nausea  . Emesis  . Abdominal Pain    HPI Nylia Gavina is a 77 y.o. female.  Presents to the emergency department for evaluation of abdominal pain.  Patient reports that she was awakened from sleep around midnight.  Patient reports severe, diffuse, constant and unchanging abdominal pain associated with nausea and vomiting.  She has never had similar pain.     Past Medical History:  Diagnosis Date  . Disorder of bone and cartilage, unspecified   . Disorders of bursae and tendons in shoulder region, unspecified   . Encounter for long-term (current) use of other medications   . Esophageal reflux   . Hyperosmolality and/or hypernatremia   . Osteoporosis, unspecified   . Other and unspecified hyperlipidemia   . Pain in joint, shoulder region   . Pain in limb   . Palpitations   . Pure hypercholesterolemia   . Reflux esophagitis   . Tachycardia, unspecified   . Unspecified essential hypertension   . Unspecified urinary incontinence     Patient Active Problem List   Diagnosis Date Noted  . Obesity (BMI 30.0-34.9) 01/30/2017  . LBBB (left bundle branch block) 08/27/2014  . Osteopenia 07/23/2014  . Intraventricular conduction delay 07/23/2014  . Intraventricular block 07/23/2014  . Sigmoid diverticulosis 07/23/2014  . Benign hypertensive heart disease without heart failure 07/03/2013  . GERD (gastroesophageal reflux disease) 07/03/2013  . Right leg pain 07/03/2013  . Impacted cerumen of left ear 07/03/2013  . Routine general medical examination at a health care facility 07/03/2013  . Hyperlipidemia with target LDL less than 130 07/03/2013    Past Surgical History:  Procedure Laterality Date  . APPENDECTOMY  1955  . Bone Density  08/14/2012  . CATARACT EXTRACTION Bilateral 2010    . CHOLECYSTECTOMY  2004  . COLONOSCOPY  03/31/2009   Diverticulosis, Dr.John Amedeo Plenty   . DIAGNOSTIC MAMMOGRAM       OB History   None      Home Medications    Prior to Admission medications   Medication Sig Start Date End Date Taking? Authorizing Provider  acetaminophen (TYLENOL) 325 MG tablet Take 325 mg by mouth every 6 (six) hours as needed for moderate pain.   Yes [provider]  aspirin 81 MG tablet Take 81 mg by mouth daily.   Yes [provider]  atenolol (TENORMIN) 25 MG tablet Take 1 tablet (25 mg total) by mouth at bedtime. 08/07/17  Yes [name], NP  Cholecalciferol (VITAMIN D) 2000 UNITS tablet Take 2,000 Units by mouth daily.    Yes [provider]  omeprazole (PRILOSEC) 20 MG capsule Take 1 capsule (20 mg total) by mouth daily. 08/01/16  Yes [name], NP  simvastatin (ZOCOR) 40 MG tablet  TAKE ONE TABLET BY MOUTH ONCE DAILY AT 6 PM. 01/16/18  Yes [name], NP    Family History History reviewed. No pertinent family history.  Social History Social History   Tobacco Use  . Smoking status: Never Smoker  . Smokeless tobacco: Never Used  Substance Use Topics  . Alcohol use: No  . Drug use: No     Allergies   Shellfish allergy   Review of Systems Review of Systems  Gastrointestinal: Positive for abdominal pain, nausea and vomiting.  All other systems  reviewed and are negative.    Physical Exam Updated Vital Signs BP (!) 150/82 (BP Location: Right Arm)   Pulse 69   Temp (!) 97.5 F (36.4 C) (Oral)   Resp 18   Ht 4' 11"  (1.499 m)   Wt 71.7 kg (158 lb)   SpO2 99%   BMI 31.91 kg/m   Physical Exam  Constitutional: She is oriented to person, place, and time. She appears well-developed and well-nourished. No distress.  HENT:  Head: Normocephalic and atraumatic.  Right Ear: Hearing normal.  Left Ear: Hearing normal.  Nose: Nose normal.  Mouth/Throat: Oropharynx is clear and moist and mucous  membranes are normal.  Eyes: Pupils are equal, round, and reactive to light. Conjunctivae and EOM are normal.  Neck: Normal range of motion. Neck supple.  Cardiovascular: Regular rhythm, S1 normal and S2 normal. Exam reveals no gallop and no friction rub.  No murmur heard. Pulmonary/Chest: Effort normal and breath sounds normal. No respiratory distress. She exhibits no tenderness.  Abdominal: Soft. Normal appearance and bowel sounds are normal. There is no hepatosplenomegaly. There is generalized tenderness. There is no rebound, no guarding, no tenderness at McBurney's point and negative Murphy's sign. No hernia.  Musculoskeletal: Normal range of motion.  Neurological: She is alert and oriented to person, place, and time. She has normal strength. No cranial nerve deficit or sensory deficit. Coordination normal. GCS eye subscore is 4. GCS verbal subscore is 5. GCS motor subscore is 6.  Skin: Skin is warm, dry and intact. No rash noted. No cyanosis.  Psychiatric: She has a normal mood and affect. Her speech is normal and behavior is normal. Thought content normal.  Nursing note and vitals reviewed.    ED Treatments / Results  Labs (all labs ordered are listed, but only abnormal results are displayed) Labs Reviewed  CBC WITH DIFFERENTIAL/PLATELET - Abnormal; Notable for the following components:      Result Value   RBC 3.80 (*)    Hemoglobin 11.7 (*)    HCT 35.0 (*)    Neutro Abs 8.6 (*)    All other components within normal limits  COMPREHENSIVE METABOLIC PANEL - Abnormal; Notable for the following components:   Potassium 3.1 (*)    Glucose, Bld 156 (*)    ALT 13 (*)    All other components within normal limits  URINALYSIS, ROUTINE W REFLEX MICROSCOPIC - Abnormal; Notable for the following components:   APPearance HAZY (*)    Glucose, UA 50 (*)    Ketones, ur 20 (*)    Leukocytes, UA TRACE (*)    Bacteria, UA RARE (*)    All other components within normal limits  I-STAT CG4 LACTIC  ACID, ED - Abnormal; Notable for the following components:   Lactic Acid, Venous 2.06 (*)    All other components within normal limits  LIPASE, BLOOD    EKG None  Radiology No results found.  Procedures Procedures (including critical care time)  Medications Ordered in ED Medications  iopamidol (ISOVUE-370) 76 % injection (has no administration in time range)  iopamidol (ISOVUE-370) 76 % injection 100 mL (has no administration in time range)  sodium chloride 0.9 % bolus 500 mL (500 mLs Intravenous New Bag/Given 02/08/18 0441)  HYDROmorphone (DILAUDID) injection 1 mg (1 mg Intravenous Given 02/08/18 0438)  ondansetron (ZOFRAN) injection 4 mg (4 mg Intravenous Given 02/08/18 0439)     Initial Impression / Assessment and Plan / ED Course  I have reviewed the triage vital signs  and the nursing notes.  Pertinent labs & imaging results that were available during my care of the patient were reviewed by me and considered in my medical decision making (see chart for details).     Presents to the emergency department with complaints of abdominal pain.  Patient reports being awakened at midnight with severe diffuse abdominal pain.  Examination reveals very mild generalized tenderness, somewhat out of proportion to the pain she is experiencing.  She is afebrile.  Vital signs are unremarkable other than mild hypertension.  Blood work is also unremarkable.  Patient will undergo abdominal CT angiography to rule out mesenteric ischemia as well as other abdominal etiology causing her abdominal pain, nausea and vomiting.  Will sign out to oncoming ER physician to follow-up CT scan.  Final Clinical Impressions(s) / ED Diagnoses   Final diagnoses:  Non-intractable vomiting with nausea, unspecified vomiting type  Generalized abdominal pain    ED Discharge Orders    None       Orpah Greek, MD 02/08/18 [id number]

## 2018-02-08 NOTE — ED Triage Notes (Signed)
Patient has been complaining of nausea, vomiting and abdominal pain for about 1.5 hours prior to arrival. She reported that she has vomited 4 times in 24 hrs. Zofran was given during transportation to the ED.

## 2018-02-08 NOTE — H&P (Signed)
History and Physical    Brooke Cox ZJQ:[id number] DOB: [birth year] DOA: 02/08/2018  PCP: [name], NP Patient coming from: Home  Chief Complaint: Abdominal pain  HPI: Brooke Cox is a 77 y.o. female with medical history significant of hypertension, diastolic heart dysfunction, GERD, left bundle branch block, sigmoid diverticulosis, obesity, hyperlipidemia. Around midnight, patient started to have significant burning pain that worsened. She took Tylenol which did not help with her symptoms.  She has associated nausea and vomiting.  For the past month she has had intermittent watery stools and more recently, her stools have been more watery than solid.  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  Gastrointestinal: Positive for abdominal pain, diarrhea, nausea and vomiting. Negative for blood in stool, constipation and melena.  All other systems reviewed and are negative.   Past Medical History:  Diagnosis Date  . Disorder of bone and cartilage, unspecified   . Disorders of bursae and tendons in shoulder region, unspecified   . Encounter for long-term (current) use of other medications   . Esophageal reflux   . Hyperosmolality and/or hypernatremia   . Osteoporosis, unspecified   . Other and unspecified hyperlipidemia   . Pain in joint, shoulder region   . Pain in limb   . Palpitations   . Pure  hypercholesterolemia   . Reflux esophagitis   . Tachycardia, unspecified   . Unspecified essential hypertension   . Unspecified urinary incontinence     Past Surgical History:  Procedure Laterality Date  . APPENDECTOMY  1955  . Bone Density  08/14/2012  . CATARACT EXTRACTION Bilateral 2010  . CHOLECYSTECTOMY  2004  . COLONOSCOPY  03/31/2009   Diverticulosis, Dr.John Amedeo Plenty   . DIAGNOSTIC MAMMOGRAM       reports that she has never smoked. She has never used smokeless tobacco. She reports that she does not drink alcohol or use drugs.  Allergies  Allergen Reactions  . Shellfish Allergy Nausea And Vomiting    Family History  Problem Relation Age of Onset  . Crohn's disease Neg Hx   . Inflammatory bowel disease Neg Hx     Prior to Admission medications   Medication Sig Start Date End Date Taking? Authorizing Provider  acetaminophen (TYLENOL) 325 MG tablet Take 325 mg by mouth every 6 (six) hours as needed for moderate pain.   Yes [provider]  aspirin 81 MG tablet Take 81 mg by mouth daily.   Yes [provider]  atenolol (TENORMIN) 25 MG tablet Take 1 tablet (25 mg total) by mouth at bedtime. 08/07/17  Yes [name], NP  Cholecalciferol (VITAMIN D) 2000 UNITS tablet Take 2,000 Units by mouth daily.    Yes [provider]  omeprazole (PRILOSEC) 20 MG capsule Take 1 capsule (20 mg total) by mouth daily. 08/01/16  Yes [name], NP  simvastatin (ZOCOR) 40 MG tablet  TAKE ONE TABLET BY MOUTH ONCE DAILY AT 6 PM. 01/16/18  Yes [name], NP    Physical Exam: Vitals:   02/08/18  0500 02/08/18 0600 02/08/18 0727 02/08/18 0850  BP:   (!) 143/75 (!) 144/75  Pulse: 72 79 79 89  Resp: 15 13 (!) 23 18  Temp:      TempSrc:      SpO2: 92% 98% 97% 98%  Weight:      Height:         Constitutional: NAD, calm, comfortable Eyes: PERRL, lids and conjunctivae normal ENMT: Mucous membranes are moist. Posterior pharynx clear of any  exudate or lesions. Neck: normal Respiratory: mild end expiratory wheeze with rales at left base. Normal respiratory effort. No accessory muscle use.  Cardiovascular: Regular rate and rhythm, no murmurs / rubs / gallops. No extremity edema. 2+ pedal pulses. No carotid bruits.  Abdomen: mild upper quadrant tenderness without rebound or guarding. Diminished but present bowel sounds. Musculoskeletal: no clubbing / cyanosis. No joint deformity upper and lower extremities. Good ROM, no contractures. Normal muscle tone.  Skin: no rashes, lesions, ulcers. No induration Neurologic: CN 2-12 grossly intact. Sensation intact, DTR normal. Strength 5/5 in all 4. Somnolent, but easily arouses to voice. Psychiatric: Normal judgment and insight. Alert and oriented x 3. Normal mood.   Labs on Admission: I have personally reviewed following labs and imaging studies  CBC: Recent Labs  Lab 02/06/18 0904 02/08/18 0500  WBC 6.3 10.3  NEUTROABS 4,114 8.6*  HGB 12.2 11.7*  HCT 35.2 35.0*  MCV 89.3 92.1  PLT 300 761   Basic Metabolic Panel: Recent Labs  Lab 02/06/18 0904 02/08/18 0500  NA 141 139  K 4.3 3.1*  CL 106 106  CO2 31 23  GLUCOSE 79 156*  BUN 16 19  CREATININE 0.66 0.78  CALCIUM 10.1 9.9   GFR: Estimated Creatinine Clearance: 49.1 mL/min (by C-G formula based on SCr of 0.78 mg/dL). Liver Function Tests: Recent Labs  Lab 02/06/18 0904 02/08/18 0500  AST 15 16  ALT 9 13*  ALKPHOS  --  59  BILITOT 0.5 0.5  PROT 6.6 6.6  ALBUMIN  --  3.8   Recent Labs  Lab 02/08/18 0500  LIPASE 22   No results for input(s): AMMONIA in the last 168 hours. Coagulation Profile: No results for input(s): INR, PROTIME in the last 168 hours. Cardiac Enzymes: No results for input(s): CKTOTAL, CKMB, CKMBINDEX, TROPONINI in the last 168 hours. BNP (last 3 results) No results for input(s): PROBNP in the last 8760 hours. HbA1C: No results for input(s): HGBA1C in the last 72 hours. CBG: No results  for input(s): GLUCAP in the last 168 hours. Lipid Profile: Recent Labs    02/06/18 0904  CHOL 196  HDL 59  LDLCALC 110*  TRIG 156*  CHOLHDL 3.3   Thyroid Function Tests: No results for input(s): TSH, T4TOTAL, FREET4, T3FREE, THYROIDAB in the last 72 hours. Anemia Panel: No results for input(s): VITAMINB12, FOLATE, FERRITIN, TIBC, IRON, RETICCTPCT in the last 72 hours. Urine analysis:    Component Value Date/Time   COLORURINE YELLOW 02/08/2018 0610   APPEARANCEUR HAZY (A) 02/08/2018 0610   LABSPEC 1.017 02/08/2018 0610   PHURINE 7.0 02/08/2018 0610   GLUCOSEU 50 (A) 02/08/2018 0610   HGBUR NEGATIVE 02/08/2018 0610   BILIRUBINUR NEGATIVE 02/08/2018 0610   KETONESUR 20 (A) 02/08/2018 0610   PROTEINUR NEGATIVE 02/08/2018 0610   NITRITE NEGATIVE 02/08/2018 0610   LEUKOCYTESUR TRACE (A) 02/08/2018 0610    Radiological Exams on Admission: Ct Angio Abd/pel W And/or Wo Contrast  Result Date: 02/08/2018 EXAM: CTA ABDOMEN AND PELVIS wITHOUT AND  WITH CONTRAST TECHNIQUE: Multidetector CT imaging of the abdomen and pelvis was performed using the standard protocol during bolus administration of intravenous contrast. Multiplanar reconstructed images and MIPs were obtained and reviewed to evaluate the vascular anatomy. CONTRAST:  129m ISOVUE-370 IOPAMIDOL (ISOVUE-370) INJECTION 76% COMPARISON:  None. FINDINGS: VASCULAR Aorta: Nonaneurysmal and patent with scattered atherosclerotic calcification. 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 Branches reconstitute via pancreatic collateral vessels. Branch vessels are patent. No obvious visceral artery aneurysm within the branches. SMA: Patent.  Replaced right hepatic artery anatomy. Renals: Single renal arteries are patent. IMA: Patent and diminutive. Inflow: Bilateral common iliac, external iliac, and internal iliac arteries are patent. Mild atherosclerotic calcifications are noted. Proximal Outflow:  Grossly patent. Veins: No evidence of DVT. Review of the MIP images confirms the above findings. NON-VASCULAR Lower chest: Bibasilar dependent atelectasis versus consolidation right greater than left. Hepatobiliary: Postcholecystectomy. Diffuse hepatic steatosis. There is a 3.1 cm low-density lesion within the medial segment of the left lobe of the liver centrally. It is characterized by peripheral discontinuous puddling on venous phase images. This is most consistent with a benign hemangioma. There is an avidly enhancing lesion in the right lobe of the liver measuring 11 mm on image 33 which is nonspecific but may represent a hemangioma. Pancreas: Unremarkable Spleen: Unremarkable Adrenals/Urinary Tract: Adrenal glands are within normal limits. Mild cortical atrophy of the kidneys. Decompressed bladder. Stomach/Bowel: Post appendectomy staples are noted. No obvious mass in the colon. There is a segment of distal small bowel in the right lower quadrant with wall thickening and inflammatory change in the adjacent fat. Mildly dilated small bowel loops with air-fluid levels are present proximal to this area. There is no pneumatosis or extraluminal bowel gas. There is a small amount of free fluid within the adjacent right paracolic gutter. Large hiatal hernia is present. Duodenum is unremarkable. Sigmoid diverticulosis. Lymphatic: No abnormal retroperitoneal adenopathy. Reproductive: The endometrial stripe in the fundus of the uterus is markedly thickened measuring 15 mm. Adnexa are unremarkable. Other: Small amount of free fluid layers in the pelvis. Musculoskeletal: No vertebral compression. Degenerative change in the lower lumbar spine. 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 Branches reconstitute via pancreatic collaterals from the SMA. SMA and IMA are patent. No acute vascular pathology. NON-VASCULAR There is a segment of small bowel wall thickening and inflammatory change in the right lower  quadrant. This results in an element of partial small bowel obstruction and dilated proximal small bowel loops. Differential diagnosis includes infection, inflammatory bowel disease, and ischemia. There is no obvious evidence of a internal hernia or torsion of small bowel. Large hiatal hernia. Thickening of the endometrial stripe of the uterus. Pathology cannot be excluded. Ultrasound is warranted. There are 2 lesions in the liver as described. They most likely represent benign entities such as hemangiomata. If there is a history of malignancy or high risk of malignancy, six-month follow-up MRI is recommended. Electronically Signed   By: AMarybelle KillingsM.D.   On: 02/08/2018 08:28    Assessment/Plan Principal Problem:   Partial small bowel obstruction (HCC) Active Problems:   Benign hypertensive heart disease without heart failure   Hyperlipidemia with target LDL less than 130   Sigmoid diverticulosis   Obesity (BMI 30.0-34.9)  Abdominal pain Likely related to partial SBO. Differential includes inflammatory/ischemic. No hematochezia/melena. SMA/IMA patent. -Dilaudid prn -Manage partial SBO; if no improvement/worsens, may need to re-consider ischemic etiology  Partial small bowel obstruction Seen  on CTA abdomen/pelvis.  Nausea has improved a little bit this morning.  Has not vomited recently. Related to adhesions (prior surgery) vs infectious. -N.p.o. -Consult general surgery -Monitor for worsening symptoms -Unasyn for empiric antibiotic coverage -D5 1/2 IV fluids  Essential hypertension On atenolol as an outpatient -Metoprolol 2.5 mg IV q 8 hours while n.po. -Hydralazine 5 mg prn  Hyperlipidemia Patient on simvastatin as an outpatient. -Hold simvastatin while n.p.o.  Hepatic steatosis Incidentally found. Likely NAFLD. Outpatient follow-up.  Obesity Body mass index is 31.91 kg/m.   Chronic diastolic dysfunction No heart failure symptoms. Euvolemic on exam except for mild  rales. -Chest x-ray -Daily weight/strict in and out -Watch fluid status while on IV fluids   DVT prophylaxis: Lovenox Code Status: DNR Family Communication: Husband at bedside Disposition Plan: Discharge pending improvement of partial small bowel obstruction Consults called: General surgery Admission status: Inpatient, medical floor   Cordelia Poche, MD [facility] Pager [phone]  If 7PM-7AM, please contact night-coverage [email or web address] Password [email or web address]  02/08/2018, 10:16 AM

## 2018-02-08 NOTE — ED Notes (Signed)
Bed: WA22 Expected date:  Expected time:  Means of arrival:  Comments: EMS 

## 2018-02-08 NOTE — ED Notes (Signed)
I gave critical I Stat CG4 results to MD Pollina

## 2018-02-08 NOTE — Progress Notes (Signed)
[facility] Surgery Consult Note  Brooke Cox [birth year]  [id number].    Requesting MD: Lonny Prude, MD Chief Complaint/Reason for Consult: pSBO  HPI:  Ms. Brooke Cox is a 77 year old female with a past medical history of hypertension, hyperlipidemia, left bundle branch block, GERD, diverticulosis, obesity, and osteoporosis who presented to [facility] emergency department with a chief complaint of abdominal pain.  She reports the pain started less than 24 hours ago.  Pain described as constant, central abdominal pain that woke her up at midnight. Pain is non-radiating.  Associated symptoms include nausea and vomiting.  Bowel movements are described as soft and brown, she denies watery diarrhea. Last BM was yesterday. She denies sick contacts or recent travel.The patient is allergic to shellfish.  She is a non-smoker.  Past abdominal surgeries include appendectomy (1955) and cholecystectomy (2004, Dr. Dalbert Batman).  Her last colonoscopy was in 2010 and showed diverticulosis of the sigmoid colon.   In the emergency department the patient was found to be afebrile, vital signs stable.  Lactic acid 2.06, hypokalemia (3.1).  No leukocytosis, normal LFTs and lipase.  A CT angio of the abdomen and pelvis was ordered due to concerns for mesenteric ischemia and revealed no acute vascular pathology.  The scan did show a segment of distal small bowel in the right lower quadrant with inflammatory changes and proximal small bowel dilatation.  Patient also has a hiatal hernia and 2 liver lesions. General surgery has been asked to consult for partial small bowel obstruction.  ROS: Review of Systems  Constitutional: Negative for chills, fever and weight loss.  Respiratory: Negative for cough.   Cardiovascular: Negative for chest pain.  Gastrointestinal: Positive for abdominal pain, nausea and vomiting. Negative for constipation.  All other systems reviewed and are negative.   Family History  Problem Relation Age  of Onset  . Crohn's disease Neg Hx   . Inflammatory bowel disease Neg Hx     Past Medical History:  Diagnosis Date  . Disorder of bone and cartilage, unspecified   . Disorders of bursae and tendons in shoulder region, unspecified   . Encounter for long-term (current) use of other medications   . Esophageal reflux   . Hyperosmolality and/or hypernatremia   . Osteoporosis, unspecified   . Other and unspecified hyperlipidemia   . Pain in joint, shoulder region   . Pain in limb   . Palpitations   . Pure hypercholesterolemia   . Reflux esophagitis   . Tachycardia, unspecified   . Unspecified essential hypertension   . Unspecified urinary incontinence     Past Surgical History:  Procedure Laterality Date  . APPENDECTOMY  1955  . Bone Density  08/14/2012  . CATARACT EXTRACTION Bilateral 2010  . CHOLECYSTECTOMY  2004  . COLONOSCOPY  03/31/2009   Diverticulosis, Dr.John Amedeo Plenty   . DIAGNOSTIC MAMMOGRAM      Social History:  reports that she has never smoked. She has never used smokeless tobacco. She reports that she does not drink alcohol or use drugs.  Allergies:  Allergies  Allergen Reactions  . Shellfish Allergy Nausea And Vomiting     (Not in a hospital admission)  Blood pressure (!) 158/80, pulse 85, temperature (!) 97.5 F (36.4 C), temperature source Oral, resp. rate 13, height 4' 11"  (1.499 m), weight 71.7 kg (158 lb), SpO2 97 %. Physical Exam: Physical Exam  Constitutional: She is oriented to person, place, and time. She appears well-developed and well-nourished.  Non-toxic appearance. She does not appear ill.  somnoloent  2/2 pain meds but arouses to voice   HENT:  Head: Normocephalic and atraumatic.  Mouth/Throat: Oropharynx is clear and moist.  Eyes: Pupils are equal, round, and reactive to light. EOM are normal. No scleral icterus.  Cardiovascular: Normal rate, regular rhythm, normal heart sounds and intact distal pulses. Exam reveals no friction rub.  No  murmur heard. Pulmonary/Chest: Effort normal and breath sounds normal. No stridor. She has no rhonchi. She has no rales.  Abdominal: Soft. Normal appearance and bowel sounds are normal. There is tenderness in the right lower quadrant. There is no rigidity, no rebound and no guarding. No hernia.  Neurological: She is alert and oriented to person, place, and time.  Skin: Skin is warm and dry. No rash noted.  Psychiatric: She has a normal mood and affect. Her behavior is normal.    Results for orders placed or performed during the hospital encounter of 02/08/18 (from the past 48 hour(s))  CBC with Differential/Platelet     Status: Abnormal   Collection Time: 02/08/18  5:00 AM  Result Value Ref Range   WBC 10.3 4.0 - 10.5 K/uL   RBC 3.80 (L) 3.87 - 5.11 MIL/uL   Hemoglobin 11.7 (L) 12.0 - 15.0 g/dL   HCT 35.0 (L) 36.0 - 46.0 %   MCV 92.1 78.0 - 100.0 fL   MCH 30.8 26.0 - 34.0 pg   MCHC 33.4 30.0 - 36.0 g/dL   RDW 13.0 11.5 - 15.5 %   Platelets 271 150 - 400 K/uL   Neutrophils Relative % 85 %   Neutro Abs 8.6 (H) 1.7 - 7.7 K/uL   Lymphocytes Relative 12 %   Lymphs Abs 1.2 0.7 - 4.0 K/uL   Monocytes Relative 3 %   Monocytes Absolute 0.4 0.1 - 1.0 K/uL   Eosinophils Relative 0 %   Eosinophils Absolute 0.0 0.0 - 0.7 K/uL   Basophils Relative 0 %   Basophils Absolute 0.0 0.0 - 0.1 K/uL    Comment: Performed at [facility], [place] [address], [place], [place] [address]  Comprehensive metabolic panel     Status: Abnormal   Collection Time: 02/08/18  5:00 AM  Result Value Ref Range   Sodium 139 135 - 145 mmol/L   Potassium 3.1 (L) 3.5 - 5.1 mmol/L   Chloride 106 101 - 111 mmol/L   CO2 23 22 - 32 mmol/L   Glucose, Bld 156 (H) 65 - 99 mg/dL   BUN 19 6 - 20 mg/dL   Creatinine, Ser 0.78 0.44 - 1.00 mg/dL   Calcium 9.9 8.9 - 10.3 mg/dL   Total Protein 6.6 6.5 - 8.1 g/dL   Albumin 3.8 3.5 - 5.0 g/dL   AST 16 15 - 41 U/L   ALT 13 (L) 14 - 54 U/L   Alkaline Phosphatase 59  38 - 126 U/L   Total Bilirubin 0.5 0.3 - 1.2 mg/dL   GFR calc non Af Amer >60 >60 mL/min   GFR calc Af Amer >60 >60 mL/min    Comment: (NOTE) The eGFR has been calculated using the CKD EPI equation. This calculation has not been validated in all clinical situations. eGFR's persistently <60 mL/min signify possible Chronic Kidney Disease.    Anion gap 10 5 - 15    Comment: Performed at [facility], [place] [address], [place], [place] [address]  Lipase, blood     Status: None   Collection Time: 02/08/18  5:00 AM  Result Value Ref Range  Lipase 22 11 - 51 U/L    Comment: Performed at [facility], [place] [address] [place], [place] [address]  I-Stat CG4 Lactic Acid, ED     Status: Abnormal   Collection Time: 02/08/18  5:36 AM  Result Value Ref Range   Lactic Acid, Venous 2.06 (HH) 0.5 - 1.9 mmol/L   Comment NOTIFIED PHYSICIAN   Urinalysis, Routine w reflex microscopic     Status: Abnormal   Collection Time: 02/08/18  6:10 AM  Result Value Ref Range   Color, Urine YELLOW YELLOW   APPearance HAZY (A) CLEAR   Specific Gravity, Urine 1.017 1.005 - 1.030   pH 7.0 5.0 - 8.0   Glucose, UA 50 (A) NEGATIVE mg/dL   Hgb urine dipstick NEGATIVE NEGATIVE   Bilirubin Urine NEGATIVE NEGATIVE   Ketones, ur 20 (A) NEGATIVE mg/dL   Protein, ur NEGATIVE NEGATIVE mg/dL   Nitrite NEGATIVE NEGATIVE   Leukocytes, UA TRACE (A) NEGATIVE   RBC / HPF 0-5 0 - 5 RBC/hpf   WBC, UA 0-5 0 - 5 WBC/hpf   Bacteria, UA RARE (A) NONE SEEN   Squamous Epithelial / LPF 0-5 0 - 5    Comment: Performed at [facility], [place] [address] [place], [place] [address]   Ct Angio Abd/pel W And/or Wo Contrast  Result Date: 02/08/2018 EXAM: CTA ABDOMEN AND PELVIS wITHOUT AND WITH CONTRAST TECHNIQUE: Multidetector CT imaging of the abdomen and pelvis was performed using the standard protocol during bolus administration of intravenous contrast. Multiplanar reconstructed images  and MIPs were obtained and reviewed to evaluate the vascular anatomy. CONTRAST:  16m ISOVUE-370 IOPAMIDOL (ISOVUE-370) INJECTION 76% COMPARISON:  None. FINDINGS: VASCULAR Aorta: Nonaneurysmal and patent with scattered atherosclerotic calcification. 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 Branches reconstitute via pancreatic collateral vessels. Branch vessels are patent. No obvious visceral artery aneurysm within the branches. SMA: Patent.  Replaced right hepatic artery anatomy. Renals: Single renal arteries are patent. IMA: Patent and diminutive. Inflow: Bilateral common iliac, external iliac, and internal iliac arteries are patent. Mild atherosclerotic calcifications are noted. Proximal Outflow: Grossly patent. Veins: No evidence of DVT. Review of the MIP images confirms the above findings. NON-VASCULAR Lower chest: Bibasilar dependent atelectasis versus consolidation right greater than left. Hepatobiliary: Postcholecystectomy. Diffuse hepatic steatosis. There is a 3.1 cm low-density lesion within the medial segment of the left lobe of the liver centrally. It is characterized by peripheral discontinuous puddling on venous phase images. This is most consistent with a benign hemangioma. There is an avidly enhancing lesion in the right lobe of the liver measuring 11 mm on image 33 which is nonspecific but may represent a hemangioma. Pancreas: Unremarkable Spleen: Unremarkable Adrenals/Urinary Tract: Adrenal glands are within normal limits. Mild cortical atrophy of the kidneys. Decompressed bladder. Stomach/Bowel: Post appendectomy staples are noted. No obvious mass in the colon. There is a segment of distal small bowel in the right lower quadrant with wall thickening and inflammatory change in the adjacent fat. Mildly dilated small bowel loops with air-fluid levels are present proximal to this area. There is no pneumatosis or extraluminal bowel gas. There is a small  amount of free fluid within the adjacent right paracolic gutter. Large hiatal hernia is present. Duodenum is unremarkable. Sigmoid diverticulosis. Lymphatic: No abnormal retroperitoneal adenopathy. Reproductive: The endometrial stripe in the fundus of the uterus is markedly thickened measuring 15 mm. Adnexa are unremarkable. Other: Small amount of free fluid layers  in the pelvis. Musculoskeletal: No vertebral compression. Degenerative change in the lower lumbar spine. 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 Branches reconstitute via pancreatic collaterals from the SMA. SMA and IMA are patent. No acute vascular pathology. NON-VASCULAR There is a segment of small bowel wall thickening and inflammatory change in the right lower quadrant. This results in an element of partial small bowel obstruction and dilated proximal small bowel loops. Differential diagnosis includes infection, inflammatory bowel disease, and ischemia. There is no obvious evidence of a internal hernia or torsion of small bowel. Large hiatal hernia. Thickening of the endometrial stripe of the uterus. Pathology cannot be excluded. Ultrasound is warranted. There are 2 lesions in the liver as described. They most likely represent benign entities such as hemangiomata. If there is a history of malignancy or high risk of malignancy, six-month follow-up MRI is recommended. Electronically Signed   By: Marybelle Killings M.D.   On: 02/08/2018 08:28   Assessment/Plan pSBO - afebrile, VSS, abdominal exam with focal RLQ tenderness without peritonitis.  - partial obstruction appears 2/2 terminal ileitis unk etiology. Pt does not have a history of crohn's or inflammatory bowel disease. Recommend consulting GI  - bowel rest, consider NG tube for persistent vomiting   - abx per medicine   - no acute surgical needs. General surgery will sign off - call/re-consult as needed.   Jill Alexanders, [facility] 02/08/2018, 11:50  AM Pager: [phone] Consults: [phone] Mon-Fri 7:00 am-4:30 pm Sat-Sun 7:00 am-11:30 am

## 2018-02-08 NOTE — Progress Notes (Signed)
PHARMACY NOTE -  Unasyn  Pharmacy has been consulted to dose Unasyn for intra-abdominal infection.  Dosage remains stable at 3g IV q6 hr and need for further dosage adjustment appears unlikely at present   Pharmacy will sign off, following peripherally for culture results or dose adjustments.  Reuel Boom, PharmD, BCPS [phone] 02/08/2018, 11:28 AM

## 2018-02-08 NOTE — ED Provider Notes (Signed)
Patient assumed from Dr. Mariane Baumgarten at 8 AM.  CT pending.  Patient had recurrent episode of pain and was remedicated.  CT shows ileitis with distal ileum thickening and inflammation.  Proximal dilatation consistent with at least partial small bowel obstruction.  Patient did have bowel movement this morning.  Nausea improved after medications.  CT angiogram shows iliac axis obstructed.  Collateral flows.  IMA diminutive but patent.  SMA patent.  Ileum fed by SMA, doubt ischemic-itis.  Possible inflammatory or infectious.  Discussed with Dr. Cordelia Poche of hospitalist.  He is here evaluating patient for admission.   Tanna Furry, MD 02/08/18 1027

## 2018-02-08 NOTE — ED Notes (Signed)
Ultrasound at bedside

## 2018-02-09 DIAGNOSIS — K5000 Crohn's disease of small intestine without complications: Secondary | ICD-10-CM

## 2018-02-09 LAB — CBC
HCT: 38.8 % (ref 36.0–46.0)
Hemoglobin: 12.8 g/dL (ref 12.0–15.0)
MCH: 30.9 pg (ref 26.0–34.0)
MCHC: 33 g/dL (ref 30.0–36.0)
MCV: 93.7 fL (ref 78.0–100.0)
Platelets: 293 10*3/uL (ref 150–400)
RBC: 4.14 MIL/uL (ref 3.87–5.11)
RDW: 13.3 % (ref 11.5–15.5)
WBC: 14.8 10*3/uL — AB (ref 4.0–10.5)

## 2018-02-09 LAB — BASIC METABOLIC PANEL
Anion gap: 7 (ref 5–15)
BUN: 15 mg/dL (ref 6–20)
CHLORIDE: 106 mmol/L (ref 101–111)
CO2: 28 mmol/L (ref 22–32)
CREATININE: 0.58 mg/dL (ref 0.44–1.00)
Calcium: 9.5 mg/dL (ref 8.9–10.3)
Glucose, Bld: 130 mg/dL — ABNORMAL HIGH (ref 65–99)
Potassium: 3.7 mmol/L (ref 3.5–5.1)
Sodium: 141 mmol/L (ref 135–145)

## 2018-02-09 MED ORDER — PIPERACILLIN-TAZOBACTAM 3.375 G IVPB
3.3750 g | Freq: Three times a day (TID) | INTRAVENOUS | Status: DC
  Administered 2018-02-09 – 2018-02-16 (×21): 3.375 g via INTRAVENOUS
  Filled 2018-02-09 (×22): qty 50

## 2018-02-09 MED ORDER — METOPROLOL TARTRATE 5 MG/5ML IV SOLN
5.0000 mg | Freq: Four times a day (QID) | INTRAVENOUS | Status: DC
  Administered 2018-02-09 – 2018-02-10 (×3): 5 mg via INTRAVENOUS
  Filled 2018-02-09 (×3): qty 5

## 2018-02-09 NOTE — Progress Notes (Signed)
PROGRESS NOTE [facility]   Brooke Cox   [facility]:[id number] DOB: 1941-03-16  DOA: 02/08/2018 PCP: [name], NP   Brief Narrative:  Brooke Cox is a 77 year old female with medical history significant for hypertension, CHF,  diverticulosis and GERD who presented to the emergency department complaining of severe abdominal pain.  Associated symptoms were nausea and vomiting.  Upon ED evaluation lab work-up was unremarkable.  CT of the abdomen and pelvis showed small bowel wall thickening and inflammatory change in the right lower quadrant with element of partial small bowel obstruction and dilated proximal small bowel loops. ?  Infectious versus inflammatory versus ischemia. A large hiatal hernia.  Patient was admitted with working diagnosis of partial small bowel obstruction secondary to infectious versus inflammatory process  Subjective: Patient seen and examined, complaining of abdominal pain from the right lower quadrant. No bowel movement or flatus.  Denies nausea and vomiting.  No other concerns, afebrile.  Assessment & Plan: Partial small bowel obstruction in setting of infectious versus inflammatory process. General surgery recommendations appreciated, no surgical abdomen at this point. GI recommendations appreciated, unlikely to be Crohn's disease. Terminal ileitis will broaden antibiotic coverage with Zosyn.  Continue supportive treatment and n.p.o.  Out of bed as tolerated.  GI panel pending.  Essential hypertension BP slightly above goal While patient is n.p.o. we will treat with metoprolol IV 5 mg every 6 hour Continue to monitor BP closely  Chronic diastolic dysfunction No signs of fluid overload Continue to monitor  Hepatic steatosis Incidental finding on CT abdomen/pelvis, also 2 lesions likely consistent with hemangiomata, recommend six-month follow-up with MRI  Endometrial thickening Incidental finding on CT abdomen and pelvis, recommend ultrasound  as an outpatient.  DVT prophylaxis: SCDs Code Status: DNR Family Communication: Family at bedside Disposition Plan: Home when able to tolerate oral diet  Consultants:   GI  General surgery  Procedures:   None  Antimicrobials: Anti-infectives (From admission, onward)   Start     Dose/Rate Route Frequency Ordered Stop   02/09/18 1600  piperacillin-tazobactam (ZOSYN) IVPB 3.375 g     3.375 g 12.5 mL/hr over 240 Minutes Intravenous Every 8 hours 02/09/18 1145     02/08/18 1200  Ampicillin-Sulbactam (UNASYN) 3 g in sodium chloride 0.9 % 100 mL IVPB  Status:  Discontinued     3 g 200 mL/hr over 30 Minutes Intravenous Every 6 hours 02/08/18 1127 02/09/18 1130          Objective: Vitals:   02/08/18 2109 02/09/18 0510 02/09/18 0850 02/09/18 1240  BP: 139/79 (!) 155/83 (!) 149/83 (!) 151/74  Pulse: 85 83 (!) 106 88  Resp: 18 14  16   Temp: 98.6 F (37 C) 98.6 F (37 C) 98.2 F (36.8 C) 98.7 F (37.1 C)  TempSrc: Oral Oral Oral Oral  SpO2: 99% 96% 95% 95%  Weight:  72.8 kg (160 lb 7.9 oz)    Height:        Intake/Output Summary (Last 24 hours) at 02/09/2018 1646 Last data filed at 02/09/2018 1549 Gross per 24 hour  Intake 1540.84 ml  Output 300 ml  Net 1240.84 ml   Filed Weights   02/08/18 0429 02/09/18 0510  Weight: 71.7 kg (158 lb) 72.8 kg (160 lb 7.9 oz)    Examination:  General exam: Appears calm and comfortable  HEENT: AC/AT, PERRLA, OP moist and clear Respiratory system: Clear to auscultation. No wheezes,crackle or rhonchi Cardiovascular system: S1 & S2 heard, RRR. No JVD, murmurs, rubs or gallops  Gastrointestinal system: Abdomen soft, mild right lower quadrant tenderness, distant bowel sounds, nondistended, no rebound or guarding.  No organomegaly Central nervous system: Alert and oriented. No focal neurological deficits. Extremities: No pedal edema. Symmetric, strength 5/5   Skin: No rashes, lesions or ulcers Psychiatry: Judgement and insight appear  normal. Mood & affect appropriate.    Data Reviewed: I have personally reviewed following labs and imaging studies  CBC: Recent Labs  Lab 02/06/18 0904 02/08/18 0500 02/09/18 0519  WBC 6.3 10.3 14.8*  NEUTROABS 4,114 8.6*  --   HGB 12.2 11.7* 12.8  HCT 35.2 35.0* 38.8  MCV 89.3 92.1 93.7  PLT 300 271 096   Basic Metabolic Panel: Recent Labs  Lab 02/06/18 0904 02/08/18 0500 02/09/18 0519  NA 141 139 141  K 4.3 3.1* 3.7  CL 106 106 106  CO2 31 23 28   GLUCOSE 79 156* 130*  BUN 16 19 15   CREATININE 0.66 0.78 0.58  CALCIUM 10.1 9.9 9.5   GFR: Estimated Creatinine Clearance: 49.5 mL/min (by C-G formula based on SCr of 0.58 mg/dL). Liver Function Tests: Recent Labs  Lab 02/06/18 0904 02/08/18 0500  AST 15 16  ALT 9 13*  ALKPHOS  --  59  BILITOT 0.5 0.5  PROT 6.6 6.6  ALBUMIN  --  3.8   Recent Labs  Lab 02/08/18 0500  LIPASE 22   No results for input(s): AMMONIA in the last 168 hours. Coagulation Profile: No results for input(s): INR, PROTIME in the last 168 hours. Cardiac Enzymes: No results for input(s): CKTOTAL, CKMB, CKMBINDEX, TROPONINI in the last 168 hours. BNP (last 3 results) No results for input(s): PROBNP in the last 8760 hours. HbA1C: No results for input(s): HGBA1C in the last 72 hours. CBG: No results for input(s): GLUCAP in the last 168 hours. Lipid Profile: No results for input(s): CHOL, HDL, LDLCALC, TRIG, CHOLHDL, LDLDIRECT in the last 72 hours. Thyroid Function Tests: No results for input(s): TSH, T4TOTAL, FREET4, T3FREE, THYROIDAB in the last 72 hours. Anemia Panel: No results for input(s): VITAMINB12, FOLATE, FERRITIN, TIBC, IRON, RETICCTPCT in the last 72 hours. Sepsis Labs: Recent Labs  Lab 02/08/18 0536  LATICACIDVEN 2.06*    No results found for this or any previous visit (from the past 240 hour(s)).    Radiology Studies: Dg Chest 2 View  Result Date: 02/08/2018 CLINICAL DATA:  Short of breath.  Weakness.  Cough. EXAM:  CHEST - 2 VIEW COMPARISON:  None. FINDINGS: Cardiac silhouette is top-normal in size. Small to moderate hiatal hernia. No mediastinal or hilar masses. No convincing adenopathy. Clear lungs.  No pleural effusion or pneumothorax. Skeletal structures are intact. IMPRESSION: No active cardiopulmonary disease. Electronically Signed   By: Lajean Manes M.D.   On: 02/08/2018 17:15   Ct Angio Abd/pel W And/or Wo Contrast  Result Date: 02/08/2018 EXAM: CTA ABDOMEN AND PELVIS wITHOUT AND WITH CONTRAST TECHNIQUE: Multidetector CT imaging of the abdomen and pelvis was performed using the standard protocol during bolus administration of intravenous contrast. Multiplanar reconstructed images and MIPs were obtained and reviewed to evaluate the vascular anatomy. CONTRAST:  171m ISOVUE-370 IOPAMIDOL (ISOVUE-370) INJECTION 76% COMPARISON:  None. FINDINGS: VASCULAR Aorta: Nonaneurysmal and patent with scattered atherosclerotic calcification. 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 Branches reconstitute via pancreatic collateral vessels. Branch vessels are patent. No obvious visceral artery aneurysm within the branches. SMA: Patent.  Replaced right hepatic artery anatomy. Renals: Single renal arteries are patent. IMA:  Patent and diminutive. Inflow: Bilateral common iliac, external iliac, and internal iliac arteries are patent. Mild atherosclerotic calcifications are noted. Proximal Outflow: Grossly patent. Veins: No evidence of DVT. Review of the MIP images confirms the above findings. NON-VASCULAR Lower chest: Bibasilar dependent atelectasis versus consolidation right greater than left. Hepatobiliary: Postcholecystectomy. Diffuse hepatic steatosis. There is a 3.1 cm low-density lesion within the medial segment of the left lobe of the liver centrally. It is characterized by peripheral discontinuous puddling on venous phase images. This is most consistent with a benign hemangioma.  There is an avidly enhancing lesion in the right lobe of the liver measuring 11 mm on image 33 which is nonspecific but may represent a hemangioma. Pancreas: Unremarkable Spleen: Unremarkable Adrenals/Urinary Tract: Adrenal glands are within normal limits. Mild cortical atrophy of the kidneys. Decompressed bladder. Stomach/Bowel: Post appendectomy staples are noted. No obvious mass in the colon. There is a segment of distal small bowel in the right lower quadrant with wall thickening and inflammatory change in the adjacent fat. Mildly dilated small bowel loops with air-fluid levels are present proximal to this area. There is no pneumatosis or extraluminal bowel gas. There is a small amount of free fluid within the adjacent right paracolic gutter. Large hiatal hernia is present. Duodenum is unremarkable. Sigmoid diverticulosis. Lymphatic: No abnormal retroperitoneal adenopathy. Reproductive: The endometrial stripe in the fundus of the uterus is markedly thickened measuring 15 mm. Adnexa are unremarkable. Other: Small amount of free fluid layers in the pelvis. Musculoskeletal: No vertebral compression. Degenerative change in the lower lumbar spine. 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 Branches reconstitute via pancreatic collaterals from the SMA. SMA and IMA are patent. No acute vascular pathology. NON-VASCULAR There is a segment of small bowel wall thickening and inflammatory change in the right lower quadrant. This results in an element of partial small bowel obstruction and dilated proximal small bowel loops. Differential diagnosis includes infection, inflammatory bowel disease, and ischemia. There is no obvious evidence of a internal hernia or torsion of small bowel. Large hiatal hernia. Thickening of the endometrial stripe of the uterus. Pathology cannot be excluded. Ultrasound is warranted. There are 2 lesions in the liver as described. They most likely represent benign entities such as  hemangiomata. If there is a history of malignancy or high risk of malignancy, six-month follow-up MRI is recommended. Electronically Signed   By: Marybelle Killings M.D.   On: 02/08/2018 08:28      Scheduled Meds: . enoxaparin (LOVENOX) injection  40 mg Subcutaneous Q24H  . metoprolol tartrate  2.5 mg Intravenous Q8H   Continuous Infusions: . dextrose 5 % and 0.45% NaCl 50 mL/hr at 02/09/18 1052  . piperacillin-tazobactam (ZOSYN)  IV 3.375 g (02/09/18 1526)     LOS: 1 day    Time spent: Total of 35 minutes spent with pt, greater than 50% of which was spent in discussion of  treatment, counseling and coordination of care    Chipper Oman, MD Pager: Text Page via [email or web address]   If 7PM-7AM, please contact night-coverage [email or web address] 02/09/2018, 4:46 PM   Note - This record has been created using [employer]. Chart creation errors have been sought, but may not always have been located. Such creation errors do not reflect on the standard of medical care.

## 2018-02-09 NOTE — Progress Notes (Addendum)
Patient ID: Brooke Cox, female   DOB: 1941/02/06, 77 y.o.   MRN: [id number]       Subjective: Pt doesn't feel well this morning.  Having bad pain in her RLQ.  She denies N/V/D.  Objective: Vital signs in last 24 hours: Temp:  [97.7 F (36.5 C)-98.6 F (37 C)] 98.2 F (36.8 C) (05/31 0850) Pulse Rate:  [83-106] 106 (05/31 0850) Resp:  [12-19] 14 (05/31 0510) BP: (139-164)/(68-88) 149/83 (05/31 0850) SpO2:  [90 %-99 %] 95 % (05/31 0850) Weight:  [72.8 kg (160 lb 7.9 oz)] 72.8 kg (160 lb 7.9 oz) (05/31 0510) Last BM Date: 02/08/18  Intake/Output from previous day: 05/30 0701 - 05/31 0700 In: 1300 [I.V.:900; IV Piggyback:400] Out: 300 [Urine:300] Intake/Output this shift: No intake/output data recorded.  PE: Heart: regular Lungs: CTAB Abd: soft, +BS, ND, tender in RLQ, but just had pain medication.  No peritoneal signs or guarding.  Lab Results:  Recent Labs    02/08/18 0500 02/09/18 0519  WBC 10.3 14.8*  HGB 11.7* 12.8  HCT 35.0* 38.8  PLT 271 293   BMET Recent Labs    02/08/18 0500 02/09/18 0519  NA 139 141  K 3.1* 3.7  CL 106 106  CO2 23 28  GLUCOSE 156* 130*  BUN 19 15  CREATININE 0.78 0.58  CALCIUM 9.9 9.5   PT/INR No results for input(s): LABPROT, INR in the last 72 hours. CMP     Component Value Date/Time   NA 141 02/09/2018 0519   NA 140 07/30/2015 0950   K 3.7 02/09/2018 0519   CL 106 02/09/2018 0519   CO2 28 02/09/2018 0519   GLUCOSE 130 (H) 02/09/2018 0519   BUN 15 02/09/2018 0519   BUN 13 07/30/2015 0950   CREATININE 0.58 02/09/2018 0519   CREATININE 0.66 02/06/2018 0904   CALCIUM 9.5 02/09/2018 0519   PROT 6.6 02/08/2018 0500   PROT 6.3 07/30/2015 0950   ALBUMIN 3.8 02/08/2018 0500   ALBUMIN 4.1 07/30/2015 0950   AST 16 02/08/2018 0500   ALT 13 (L) 02/08/2018 0500   ALKPHOS 59 02/08/2018 0500   BILITOT 0.5 02/08/2018 0500   BILITOT 0.5 07/30/2015 0950   GFRNONAA >60 02/09/2018 0519   GFRNONAA 84 02/06/2018 0904   GFRAA >60  02/09/2018 0519   GFRAA 97 02/06/2018 0904   Lipase     Component Value Date/Time   LIPASE 22 02/08/2018 0500       Studies/Results: Dg Chest 2 View  Result Date: 02/08/2018 CLINICAL DATA:  Short of breath.  Weakness.  Cough. EXAM: CHEST - 2 VIEW COMPARISON:  None. FINDINGS: Cardiac silhouette is top-normal in size. Small to moderate hiatal hernia. No mediastinal or hilar masses. No convincing adenopathy. Clear lungs.  No pleural effusion or pneumothorax. Skeletal structures are intact. IMPRESSION: No active cardiopulmonary disease. Electronically Signed   By: Lajean Manes M.D.   On: 02/08/2018 17:15   Ct Angio Abd/pel W And/or Wo Contrast  Result Date: 02/08/2018 EXAM: CTA ABDOMEN AND PELVIS wITHOUT AND WITH CONTRAST TECHNIQUE: Multidetector CT imaging of the abdomen and pelvis was performed using the standard protocol during bolus administration of intravenous contrast. Multiplanar reconstructed images and MIPs were obtained and reviewed to evaluate the vascular anatomy. CONTRAST:  154m ISOVUE-370 IOPAMIDOL (ISOVUE-370) INJECTION 76% COMPARISON:  None. FINDINGS: VASCULAR Aorta: Nonaneurysmal and patent with scattered atherosclerotic calcification. 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 Branches reconstitute via pancreatic collateral vessels. [employer]  vessels are patent. No obvious visceral artery aneurysm within the branches. SMA: Patent.  Replaced right hepatic artery anatomy. Renals: Single renal arteries are patent. IMA: Patent and diminutive. Inflow: Bilateral common iliac, external iliac, and internal iliac arteries are patent. Mild atherosclerotic calcifications are noted. Proximal Outflow: Grossly patent. Veins: No evidence of DVT. Review of the MIP images confirms the above findings. NON-VASCULAR Lower chest: Bibasilar dependent atelectasis versus consolidation right greater than left. Hepatobiliary: Postcholecystectomy. Diffuse hepatic  steatosis. There is a 3.1 cm low-density lesion within the medial segment of the left lobe of the liver centrally. It is characterized by peripheral discontinuous puddling on venous phase images. This is most consistent with a benign hemangioma. There is an avidly enhancing lesion in the right lobe of the liver measuring 11 mm on image 33 which is nonspecific but may represent a hemangioma. Pancreas: Unremarkable Spleen: Unremarkable Adrenals/Urinary Tract: Adrenal glands are within normal limits. Mild cortical atrophy of the kidneys. Decompressed bladder. Stomach/Bowel: Post appendectomy staples are noted. No obvious mass in the colon. There is a segment of distal small bowel in the right lower quadrant with wall thickening and inflammatory change in the adjacent fat. Mildly dilated small bowel loops with air-fluid levels are present proximal to this area. There is no pneumatosis or extraluminal bowel gas. There is a small amount of free fluid within the adjacent right paracolic gutter. Large hiatal hernia is present. Duodenum is unremarkable. Sigmoid diverticulosis. Lymphatic: No abnormal retroperitoneal adenopathy. Reproductive: The endometrial stripe in the fundus of the uterus is markedly thickened measuring 15 mm. Adnexa are unremarkable. Other: Small amount of free fluid layers in the pelvis. Musculoskeletal: No vertebral compression. Degenerative change in the lower lumbar spine. 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 Branches reconstitute via pancreatic collaterals from the SMA. SMA and IMA are patent. No acute vascular pathology. NON-VASCULAR There is a segment of small bowel wall thickening and inflammatory change in the right lower quadrant. This results in an element of partial small bowel obstruction and dilated proximal small bowel loops. Differential diagnosis includes infection, inflammatory bowel disease, and ischemia. There is no obvious evidence of a internal hernia or torsion of  small bowel. Large hiatal hernia. Thickening of the endometrial stripe of the uterus. Pathology cannot be excluded. Ultrasound is warranted. There are 2 lesions in the liver as described. They most likely represent benign entities such as hemangiomata. If there is a history of malignancy or high risk of malignancy, six-month follow-up MRI is recommended. Electronically Signed   By: Marybelle Killings M.D.   On: 02/08/2018 08:28    Anti-infectives: Anti-infectives (From admission, onward)   Start     Dose/Rate Route Frequency Ordered Stop   02/08/18 1200  Ampicillin-Sulbactam (UNASYN) 3 g in sodium chloride 0.9 % 100 mL IVPB     3 g 200 mL/hr over 30 Minutes Intravenous Every 6 hours 02/08/18 1127         Assessment/Plan Terminal ileitis  -patient needs a GI consult.  She is not clinically acting obstructed.  She has terminal ileitis of unknown etiology, ? Infectious vs IBD, etc.  Last c-scope 2 years ago by Dr. Amedeo Plenty -the patient does not have a surgical abdomen at this time.  No needs for acute surgery -on unasyn.  This isn't a great abx for good gut coverage.  Could consider Rocephin/Flagyl, or if something stronger desired could use zosyn.  WBC is up to 14K today. -will follow  FEN - NPO VTE - Lovenox/SCDs  ID - unasyn   LOS: 1 day    Henreitta Cea , [facility] 02/09/2018, 10:44 AM Pager: [phone]

## 2018-02-09 NOTE — Consult Note (Signed)
Subjective:   HPI  The patient is a 77 year old female with a history of hypertension GERD diastolic heart dysfunction left bundle branch block, sigmoid diverticulosis obesity and hyperlipidemia. Around midnight on the night of admission she developed an acute onset of bilateral lower abdominal pain she came to the emergency room where she was evaluated a CT scan showed a segment of small bowel wall thickening and inflammatory change in the right lower quadrant resulting in partial small bowel obstruction and dilated proximal small bowel loops. 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 The SMA and IMA are patent. Patient was not experiencing any problems of abdominal pain or diarrhea-type symptoms prior to the onset of the acute onset of symptoms. No history of Crohn's disease.  Review of Systems No chest pain or shortness of breath  Past Medical History:  Diagnosis Date  . Disorder of bone and cartilage, unspecified   . Disorders of bursae and tendons in shoulder region, unspecified   . Encounter for long-term (current) use of other medications   . Esophageal reflux   . Hyperosmolality and/or hypernatremia   . Osteoporosis, unspecified   . Other and unspecified hyperlipidemia   . Pain in joint, shoulder region   . Pain in limb   . Palpitations   . Pure hypercholesterolemia   . Reflux esophagitis   . Tachycardia, unspecified   . Unspecified essential hypertension   . Unspecified urinary incontinence    Past Surgical History:  Procedure Laterality Date  . APPENDECTOMY  1955  . Bone Density  08/14/2012  . CATARACT EXTRACTION Bilateral 2010  . CHOLECYSTECTOMY  2004  . COLONOSCOPY  03/31/2009   Diverticulosis, Dr.John Amedeo Plenty   . DIAGNOSTIC MAMMOGRAM     Social History   Socioeconomic History  . Marital status: Married   Spouse name: Not on file  . Number of children: Not on file  . Years of education: Not on file  . Highest education level: Not on file  Occupational History  . Not on file  Social Needs  . Financial resource strain: Not hard at all  . Food insecurity:    Worry: Never true    Inability: Never true  . Transportation needs:    Medical: No    Non-medical: No  Tobacco Use  . Smoking status: Never Smoker  . Smokeless tobacco: Never Used  Substance and Sexual Activity  . Alcohol use: No  . Drug use: No  . Sexual activity: Never  Lifestyle  . Physical activity:    Days per week: 2 days    Minutes per session: 30 min  . Stress: Only a little  Relationships  . Social connections:    Talks on phone: More than three times a week    Gets together: More than three times a week    Attends religious service: 1 to 4 times per year    Active member of club or organization: Yes    Attends meetings of clubs or organizations: 1 to 4 times per year    Relationship status: Married  . Intimate partner violence:    Fear of current or ex partner: No    Emotionally abused: No    Physically abused: No    Forced sexual activity: No  Other Topics Concern  . Not on file  Social History Narrative  . Not on file   family history  is not on file.  Current Facility-Administered Medications:  .  Ampicillin-Sulbactam (UNASYN) 3 g in sodium chloride 0.9 % 100 mL IVPB, 3 g, Intravenous, Q6H, Polly Cobia, RPH, Stopped at 02/09/18 1031 .  dextrose 5 %-0.45 % sodium chloride infusion, , Intravenous, Continuous, Mariel Aloe, MD, Last Rate: 50 mL/hr at 02/08/18 1138 .  enoxaparin (LOVENOX) injection 40 mg, 40 mg, Subcutaneous, Q24H, Mariel Aloe, MD, 40 mg at 02/08/18 2139 .  hydrALAZINE (APRESOLINE) injection 5 mg, 5 mg, Intravenous, Q6H PRN, Mariel Aloe, MD .  HYDROmorphone (DILAUDID) injection 0.5 mg, 0.5 mg, Intravenous, Q4H PRN, Mariel Aloe, MD, 0.5 mg at 02/09/18 0926 .  metoprolol  tartrate (LOPRESSOR) injection 2.5 mg, 2.5 mg, Intravenous, Q8H, Mariel Aloe, MD, 2.5 mg at 02/09/18 0531 Allergies  Allergen Reactions  . Shellfish Allergy Nausea And Vomiting     Objective:     BP (!) 149/83 (BP Location: Right Arm)   Pulse (!) 106   Temp 98.2 F (36.8 C) (Oral)   Resp 14   Ht 4' 11"  (1.499 m)   Wt 72.8 kg (160 lb 7.9 oz)   SpO2 95%   BMI 32.42 kg/m   No acute distress  Alert and oriented  Heart regular rhythm no murmurs  Lungs clear  Abdomen: Bowel sounds diminished, soft, not distended, minimal tenderness bilaterally in the lower abdomen  Laboratory No components found for: D1    Assessment:     Small bowel obstruction. Inflammatory thickening in small bowel most likely secondary to infectious enteritis versus ischemic. I do not believe this is Crohn's disease. Patient had no symptoms whatsoever prior to the onset of these acute symptoms.      Plan:     Supportive care. Nothing by mouth for now. IV antibiotics. Obtain stool for GI pathogens panel. Patient has not had a bowel movement since admission she states. We will follow clinically.

## 2018-02-09 NOTE — Plan of Care (Signed)
Pt alert and oriented, resting with complaints of abd this am.  IV dilaudid given. Cont IV abx per MD orders. RN will monitor.

## 2018-02-09 NOTE — Progress Notes (Signed)
Pharmacy Note:  antibiotic(s) regimen of Zosyn ordered  to treat intra-abd infection.  Estimated Creatinine Clearance: 49.5 mL/min (by C-G formula based on SCr of 0.58 mg/dL).   Allergies  Allergen Reactions  . Shellfish Allergy Nausea And Vomiting    Vitals:   02/09/18 0510 02/09/18 0850  BP: (!) 155/83 (!) 149/83  Pulse: 83 (!) 106  Resp: 14   Temp: 98.6 F (37 C) 98.2 F (36.8 C)  SpO2: 96% 95%    Anti-infectives (From admission, onward)   Start     Dose/Rate Route Frequency Ordered Stop   02/08/18 1200  Ampicillin-Sulbactam (UNASYN) 3 g in sodium chloride 0.9 % 100 mL IVPB  Status:  Discontinued     3 g 200 mL/hr over 30 Minutes Intravenous Every 6 hours 02/08/18 1127 02/09/18 1130     Antimicrobials this admission:  5/30 Unasyn >> 5/31 5/31 Zosyn >>  Dose adjustments this admission:   Microbiology results:  GI panel ordered   Plan: Zosyn 3.375gm q8hr, infused over 4 hr Pharmacy will sign off note writing, will adjust dose/schedule if needed for renal function  Minda Ditto, [facility] 02/09/2018 11:44 AM

## 2018-02-10 ENCOUNTER — Inpatient Hospital Stay (HOSPITAL_COMMUNITY): Payer: Medicare Other

## 2018-02-10 LAB — CBC WITH DIFFERENTIAL/PLATELET
BASOS ABS: 0 10*3/uL (ref 0.0–0.1)
Basophils Relative: 0 %
Eosinophils Absolute: 0 10*3/uL (ref 0.0–0.7)
Eosinophils Relative: 0 %
HEMATOCRIT: 37.9 % (ref 36.0–46.0)
Hemoglobin: 12.4 g/dL (ref 12.0–15.0)
LYMPHS PCT: 7 %
Lymphs Abs: 1 10*3/uL (ref 0.7–4.0)
MCH: 30.5 pg (ref 26.0–34.0)
MCHC: 32.7 g/dL (ref 30.0–36.0)
MCV: 93.1 fL (ref 78.0–100.0)
Monocytes Absolute: 1 10*3/uL (ref 0.1–1.0)
Monocytes Relative: 7 %
NEUTROS ABS: 12.2 10*3/uL — AB (ref 1.7–7.7)
NEUTROS PCT: 86 %
PLATELETS: 287 10*3/uL (ref 150–400)
RBC: 4.07 MIL/uL (ref 3.87–5.11)
RDW: 13.3 % (ref 11.5–15.5)
WBC: 14.2 10*3/uL — AB (ref 4.0–10.5)

## 2018-02-10 LAB — BASIC METABOLIC PANEL
ANION GAP: 9 (ref 5–15)
BUN: 19 mg/dL (ref 6–20)
CO2: 26 mmol/L (ref 22–32)
Calcium: 9.8 mg/dL (ref 8.9–10.3)
Chloride: 104 mmol/L (ref 101–111)
Creatinine, Ser: 0.7 mg/dL (ref 0.44–1.00)
GFR calc Af Amer: >60 mL/min (ref >60)
GLUCOSE: 115 mg/dL — AB (ref 65–99)
POTASSIUM: 3.4 mmol/L — AB (ref 3.5–5.1)
Sodium: 139 mmol/L (ref 135–145)

## 2018-02-10 LAB — MAGNESIUM: Magnesium: 2.2 mg/dL (ref 1.7–2.4)

## 2018-02-10 MED ORDER — HYDRALAZINE HCL 20 MG/ML IJ SOLN: 5.0000 mg | Freq: Four times a day (QID) | INTRAMUSCULAR | Status: DC | PRN

## 2018-02-10 MED ORDER — ONDANSETRON HCL 4 MG/2ML IJ SOLN
4.0000 mg | Freq: Four times a day (QID) | INTRAMUSCULAR | Status: DC | PRN
  Administered 2018-02-10 – 2018-02-11 (×2): 4 mg via INTRAVENOUS
  Filled 2018-02-10 (×2): qty 2

## 2018-02-10 MED ORDER — METOPROLOL TARTRATE 5 MG/5ML IV SOLN
10.0000 mg | Freq: Four times a day (QID) | INTRAVENOUS | Status: DC
  Administered 2018-02-10 – 2018-02-19 (×35): 10 mg via INTRAVENOUS
  Filled 2018-02-10 (×36): qty 10

## 2018-02-10 NOTE — Progress Notes (Signed)
PROGRESS NOTE [facility]   [name]   AST:[id number] DOB: [birth year]  DOA: 02/08/2018 PCP: [name], NP   Brief Narrative:  Brooke Cox is a 77 year old female with medical history significant for hypertension, CHF,  diverticulosis and GERD who presented to the emergency department complaining of severe abdominal pain.  Associated symptoms were nausea and vomiting.  Upon ED evaluation lab work-up was unremarkable.  CT of the abdomen and pelvis showed small bowel wall thickening and inflammatory change in the right lower quadrant with element of partial small bowel obstruction and dilated proximal small bowel loops. ?  Infectious versus inflammatory versus ischemia. A large hiatal hernia.  Patient was admitted with working diagnosis of partial small bowel obstruction secondary to infectious versus inflammatory process  Subjective: Patient seen and examined, continues with no bowel movement or flatus.  Had one episode of nausea and vomiting this afternoon.  No acute events overnight.  Patient ambulating with no issues.  Assessment & Plan: Partial small bowel obstruction in setting of infectious versus inflammatory process. General surgery recommendations appreciated, no surgical abdomen at this point. GI recommendations appreciated, unlikely to be Crohn's disease. ?Terminal ileitis continue Zosyn for now.  GI panel pending but patient has not had any bowel movement. Continue supportive treatment and n.p.o. Encourage ambulation  Essential hypertension BP continues to be slight above goal While patient is n.p.o. will increase metoprolol IV 10 mg every 6 hour Continue to monitor BP closely  Chronic diastolic dysfunction No signs of fluid overload Continue to monitor  Hepatic steatosis Incidental finding on CT abdomen/pelvis, also 2 lesions likely consistent with hemangiomata, recommend six-month follow-up with MRI as outpatient.   Endometrial  thickening Incidental finding on CT abdomen and pelvis, recommend ultrasound as an outpatient.  DVT prophylaxis: SCDs Code Status: DNR Family Communication: Family at bedside Disposition Plan: Home when able to tolerate oral diet  Consultants:   GI  General surgery  Procedures:   None  Antimicrobials: Anti-infectives (From admission, onward)   Start     Dose/Rate Route Frequency Ordered Stop   02/09/18 1600  piperacillin-tazobactam (ZOSYN) IVPB 3.375 g     3.375 g 12.5 mL/hr over 240 Minutes Intravenous Every 8 hours 02/09/18 1145     02/08/18 1200  Ampicillin-Sulbactam (UNASYN) 3 g in sodium chloride 0.9 % 100 mL IVPB  Status:  Discontinued     3 g 200 mL/hr over 30 Minutes Intravenous Every 6 hours 02/08/18 1127 02/09/18 1130         Objective: Vitals:   02/10/18 0609 02/10/18 0947 02/10/18 1159 02/10/18 1350  BP: (!) 144/69 132/84 (!) 156/82 (!) 169/83  Pulse: (!) 104 (!) 105 92 92  Resp: 16 15 16 15   Temp: 98.5 F (36.9 C) 98.7 F (37.1 C) 98.6 F (37 C) 98.9 F (37.2 C)  TempSrc: Oral Oral Oral Oral  SpO2: 95% 94% 95% 92%  Weight: 73.1 kg (161 lb 2.5 oz)     Height:        Intake/Output Summary (Last 24 hours) at 02/10/2018 1508 Last data filed at 02/10/2018 1211 Gross per 24 hour  Intake 397.5 ml  Output 450 ml  Net -52.5 ml   Filed Weights   02/08/18 0429 02/09/18 0510 02/10/18 0609  Weight: 71.7 kg (158 lb) 72.8 kg (160 lb 7.9 oz) 73.1 kg (161 lb 2.5 oz)    Examination:  General: Pt is alert, awake, not in acute distress Cardiovascular: RRR, S1/S2 +, no rubs, no gallops Respiratory: CTA  bilaterally, no wheezing, no rhonchi Abdominal: Soft, nondistended, mild right lower quadrant tenderness.  Decreased bowel sounds. Extremities: no edema  Data Reviewed: I have personally reviewed following labs and imaging studies  CBC: Recent Labs  Lab 02/06/18 0904 02/08/18 0500 02/09/18 0519 02/10/18 0451  WBC 6.3 10.3 14.8* 14.2*  NEUTROABS 4,114  8.6*  --  12.2*  HGB 12.2 11.7* 12.8 12.4  HCT 35.2 35.0* 38.8 37.9  MCV 89.3 92.1 93.7 93.1  PLT 300 271 293 826   Basic Metabolic Panel: Recent Labs  Lab 02/06/18 0904 02/08/18 0500 02/09/18 0519 02/10/18 0451  NA 141 139 141 139  K 4.3 3.1* 3.7 3.4*  CL 106 106 106 104  CO2 31 23 28 26   GLUCOSE 79 156* 130* 115*  BUN 16 19 15 19   CREATININE 0.66 0.78 0.58 0.70  CALCIUM 10.1 9.9 9.5 9.8  MG  --   --   --  2.2   GFR: Estimated Creatinine Clearance: 49.7 mL/min (by C-G formula based on SCr of 0.7 mg/dL). Liver Function Tests: Recent Labs  Lab 02/06/18 0904 02/08/18 0500  AST 15 16  ALT 9 13*  ALKPHOS  --  59  BILITOT 0.5 0.5  PROT 6.6 6.6  ALBUMIN  --  3.8   Recent Labs  Lab 02/08/18 0500  LIPASE 22   No results for input(s): AMMONIA in the last 168 hours. Coagulation Profile: No results for input(s): INR, PROTIME in the last 168 hours. Cardiac Enzymes: No results for input(s): CKTOTAL, CKMB, CKMBINDEX, TROPONINI in the last 168 hours. BNP (last 3 results) No results for input(s): PROBNP in the last 8760 hours. HbA1C: No results for input(s): HGBA1C in the last 72 hours. CBG: No results for input(s): GLUCAP in the last 168 hours. Lipid Profile: No results for input(s): CHOL, HDL, LDLCALC, TRIG, CHOLHDL, LDLDIRECT in the last 72 hours. Thyroid Function Tests: No results for input(s): TSH, T4TOTAL, FREET4, T3FREE, THYROIDAB in the last 72 hours. Anemia Panel: No results for input(s): VITAMINB12, FOLATE, FERRITIN, TIBC, IRON, RETICCTPCT in the last 72 hours. Sepsis Labs: Recent Labs  Lab 02/08/18 0536  LATICACIDVEN 2.06*    No results found for this or any previous visit (from the past 240 hour(s)).    Radiology Studies: Dg Chest 2 View  Result Date: 02/08/2018 CLINICAL DATA:  Short of breath.  Weakness.  Cough. EXAM: CHEST - 2 VIEW COMPARISON:  None. FINDINGS: Cardiac silhouette is top-normal in size. Small to moderate hiatal hernia. No  mediastinal or hilar masses. No convincing adenopathy. Clear lungs.  No pleural effusion or pneumothorax. Skeletal structures are intact. IMPRESSION: No active cardiopulmonary disease. Electronically Signed   By: Lajean Manes M.D.   On: 02/08/2018 17:15    Scheduled Meds: . enoxaparin (LOVENOX) injection  40 mg Subcutaneous Q24H  . metoprolol tartrate  10 mg Intravenous Q6H   Continuous Infusions: . dextrose 5 % and 0.45% NaCl 50 mL/hr at 02/09/18 1052  . piperacillin-tazobactam (ZOSYN)  IV Stopped (02/10/18 1211)     LOS: 2 days    Time spent: Total of 25 minutes spent with pt, greater than 50% of which was spent in discussion of  treatment, counseling and coordination of care   Chipper Oman, MD Pager: Text Page via [email or web address]   If 7PM-7AM, please contact night-coverage [email or web address] 02/10/2018, 3:08 PM   Note - This record has been created using [employer]. Chart creation errors have been sought, but may not always have been located. Such creation errors  do not reflect on the standard of medical care.

## 2018-02-10 NOTE — Plan of Care (Signed)
Pt alert and oriented resting with abd pain this am.  States no stool.  OOB to chair and ambulated in hall.  RN will monitor.

## 2018-02-10 NOTE — Progress Notes (Signed)
Patient feels somewhat better than she did at time of admission 36 hours ago.  Did have one episode of bilious emesis earlier today, but otherwise has been fairly free of complaints.  No diarrhea, no bowel movements, no flatus.  Has been up and walking in the halls.  Exam: Lying in bed in no distress.  Mild upper abdominal tympany, slightly protuberant abdomen but no frank distention.  Slight right lower quadrant tenderness, no significant guarding, no peritoneal findings.  Labs: White count slightly improved at 14,200, renal function normal  Impression: The patient reiterates that her right lower quadrant pain was abrupt in onset, which calls to mind either an infectious or vascular process, as opposed to a chronic inflammatory condition such as Crohn's ileitis.  Functionally, she may have a low-grade distal small bowel bowel obstruction.  Plan: Continue observation.  Obtain flat and upright abdominal films later today.  Depending on those findings, if it looks like true obstruction is present, consideration could be given to NG suction.  Cleotis Nipper, M.D. Pager [phone] If no answer or after 5 PM call [phone]

## 2018-02-11 ENCOUNTER — Inpatient Hospital Stay (HOSPITAL_COMMUNITY): Payer: Medicare Other

## 2018-02-11 LAB — CBC
HEMATOCRIT: 38.8 % (ref 36.0–46.0)
Hemoglobin: 13.2 g/dL (ref 12.0–15.0)
MCH: 31.1 pg (ref 26.0–34.0)
MCHC: 34 g/dL (ref 30.0–36.0)
MCV: 91.3 fL (ref 78.0–100.0)
Platelets: 292 10*3/uL (ref 150–400)
RBC: 4.25 MIL/uL (ref 3.87–5.11)
RDW: 13.2 % (ref 11.5–15.5)
WBC: 12.9 10*3/uL — AB (ref 4.0–10.5)

## 2018-02-11 LAB — MAGNESIUM: Magnesium: 2 mg/dL (ref 1.7–2.4)

## 2018-02-11 LAB — BASIC METABOLIC PANEL
Anion gap: 10 (ref 5–15)
BUN: 24 mg/dL — AB (ref 6–20)
CHLORIDE: 103 mmol/L (ref 101–111)
CO2: 26 mmol/L (ref 22–32)
Calcium: 9.5 mg/dL (ref 8.9–10.3)
Creatinine, Ser: 0.68 mg/dL (ref 0.44–1.00)
GFR calc non Af Amer: >60 mL/min (ref >60)
Glucose, Bld: 112 mg/dL — ABNORMAL HIGH (ref 65–99)
POTASSIUM: 3.1 mmol/L — AB (ref 3.5–5.1)
SODIUM: 139 mmol/L (ref 135–145)

## 2018-02-11 MED ORDER — DEXTROSE-NACL 5-0.45 % IV SOLN
INTRAVENOUS | Status: DC
  Administered 2018-02-11: 10:00:00 via INTRAVENOUS

## 2018-02-11 MED ORDER — POTASSIUM CHLORIDE 10 MEQ/100ML IV SOLN
10.0000 meq | INTRAVENOUS | Status: AC
  Administered 2018-02-11 (×2): 10 meq via INTRAVENOUS
  Filled 2018-02-11 (×3): qty 100

## 2018-02-11 MED ORDER — HYDROCORTISONE 1 % EX CREA
1.0000 "application " | TOPICAL_CREAM | Freq: Three times a day (TID) | CUTANEOUS | Status: DC | PRN
  Filled 2018-02-11: qty 28

## 2018-02-11 MED ORDER — ONDANSETRON HCL 4 MG/2ML IJ SOLN
4.0000 mg | Freq: Four times a day (QID) | INTRAMUSCULAR | Status: DC | PRN
  Administered 2018-02-12 – 2018-02-17 (×8): 4 mg via INTRAVENOUS
  Filled 2018-02-11 (×8): qty 2

## 2018-02-11 MED ORDER — PHENOL 1.4 % MT LIQD
1.0000 | OROMUCOSAL | Status: DC | PRN
  Filled 2018-02-11: qty 177

## 2018-02-11 MED ORDER — METHYLPREDNISOLONE SODIUM SUCC 40 MG IJ SOLR
40.0000 mg | Freq: Two times a day (BID) | INTRAMUSCULAR | Status: AC
  Administered 2018-02-11 – 2018-02-13 (×6): 40 mg via INTRAVENOUS
  Filled 2018-02-11 (×6): qty 1

## 2018-02-11 MED ORDER — DIPHENHYDRAMINE HCL 50 MG/ML IJ SOLN
12.5000 mg | Freq: Four times a day (QID) | INTRAMUSCULAR | Status: DC | PRN
Start: 2018-02-11 — End: 2018-02-19

## 2018-02-11 MED ORDER — FENTANYL CITRATE (PF) 100 MCG/2ML IJ SOLN
25.0000 ug | INTRAMUSCULAR | Status: DC | PRN
  Administered 2018-02-14 (×3): 25 ug via INTRAVENOUS
  Filled 2018-02-11 (×3): qty 2

## 2018-02-11 MED ORDER — ACETAMINOPHEN 650 MG RE SUPP: 650.0000 mg | Freq: Four times a day (QID) | RECTAL | Status: DC | PRN

## 2018-02-11 MED ORDER — DEXTROSE-NACL 5-0.45 % IV SOLN
INTRAVENOUS | Status: DC
  Administered 2018-02-11: 15:00:00 via INTRAVENOUS

## 2018-02-11 MED ORDER — BISACODYL 10 MG RE SUPP
10.0000 mg | Freq: Every day | RECTAL | Status: DC
  Administered 2018-02-12 – 2018-02-16 (×4): 10 mg via RECTAL
  Filled 2018-02-11 (×5): qty 1

## 2018-02-11 MED ORDER — KCL IN DEXTROSE-NACL 40-5-0.45 MEQ/L-%-% IV SOLN
INTRAVENOUS | Status: DC
  Administered 2018-02-12 – 2018-02-14 (×3): via INTRAVENOUS
  Administered 2018-02-15: 1000 mL via INTRAVENOUS
  Administered 2018-02-15: 23:00:00 via INTRAVENOUS
  Administered 2018-02-16: 1000 mL via INTRAVENOUS
  Administered 2018-02-17: 18:00:00 via INTRAVENOUS
  Filled 2018-02-11 (×10): qty 1000

## 2018-02-11 MED ORDER — GUAIFENESIN-DM 100-10 MG/5ML PO SYRP: 10.0000 mL | ORAL_SOLUTION | ORAL | Status: DC | PRN

## 2018-02-11 MED ORDER — MAGIC MOUTHWASH
15.0000 mL | Freq: Four times a day (QID) | ORAL | Status: DC | PRN
  Filled 2018-02-11: qty 15

## 2018-02-11 MED ORDER — DIATRIZOATE MEGLUMINE & SODIUM 66-10 % PO SOLN
90.0000 mL | Freq: Once | ORAL | Status: AC
  Administered 2018-02-12: 90 mL via NASOGASTRIC
  Filled 2018-02-11: qty 90

## 2018-02-11 MED ORDER — METOCLOPRAMIDE HCL 5 MG/ML IJ SOLN
10.0000 mg | Freq: Four times a day (QID) | INTRAMUSCULAR | Status: DC | PRN
  Administered 2018-02-14: 10 mg via INTRAVENOUS
  Filled 2018-02-11: qty 2

## 2018-02-11 MED ORDER — SODIUM CHLORIDE 0.9 % IV SOLN
8.0000 mg | Freq: Four times a day (QID) | INTRAVENOUS | Status: DC | PRN
  Filled 2018-02-11: qty 4

## 2018-02-11 MED ORDER — LACTATED RINGERS IV BOLUS: 1000.0000 mL | Freq: Three times a day (TID) | INTRAVENOUS | Status: DC | PRN

## 2018-02-11 MED ORDER — MENTHOL 3 MG MT LOZG: 1.0000 | LOZENGE | OROMUCOSAL | Status: DC | PRN

## 2018-02-11 MED ORDER — ALUM & MAG HYDROXIDE-SIMETH 200-200-20 MG/5ML PO SUSP: 30.0000 mL | Freq: Four times a day (QID) | ORAL | Status: DC | PRN

## 2018-02-11 MED ORDER — METHOCARBAMOL 1000 MG/10ML IJ SOLN
1000.0000 mg | Freq: Four times a day (QID) | INTRAMUSCULAR | Status: DC | PRN
  Filled 2018-02-11: qty 10

## 2018-02-11 MED ORDER — HYDROCORTISONE 2.5 % RE CREA
1.0000 "application " | TOPICAL_CREAM | Freq: Four times a day (QID) | RECTAL | Status: DC | PRN
  Filled 2018-02-11: qty 28.35

## 2018-02-11 MED ORDER — KCL IN DEXTROSE-NACL 40-5-0.45 MEQ/L-%-% IV SOLN
INTRAVENOUS | Status: DC
  Filled 2018-02-11: qty 1000

## 2018-02-11 MED ORDER — LIP MEDEX EX OINT
1.0000 "application " | TOPICAL_OINTMENT | Freq: Two times a day (BID) | CUTANEOUS | Status: DC
  Administered 2018-02-12 – 2018-02-18 (×11): 1 via TOPICAL
  Filled 2018-02-11 (×3): qty 7

## 2018-02-11 MED ORDER — PROCHLORPERAZINE EDISYLATE 10 MG/2ML IJ SOLN: 5.0000 mg | INTRAMUSCULAR | Status: DC | PRN

## 2018-02-11 NOTE — Progress Notes (Signed)
I paged Dr Brantley Stage, who is on called for Dr Johney Maine, to make aware of the NG tube removal.

## 2018-02-11 NOTE — Progress Notes (Addendum)
DR Johney Maine ordered SBO protocol this morning. NG tube placed at 1200. Small amount of green bile was suctioned, patient vomited during procedure.    Placement verification xray results came back, advising to advance NG 10cm.   NG tube advanced 10cm, repeat Xray verification ordered.   Results showed the NG was looped in hernia. I spoke with attending, Dr. Quincy Simmonds, he ordered for me to remove the NG tube, and that it would be placed by fluoroscopy tomorrow 6/3. NG removed.

## 2018-02-11 NOTE — Progress Notes (Addendum)
More bilious emesis through the night, as well as hiccups.  No overt abdominal pain while lying in bed.   KUB from last night (reviewed) shows possible evolving SBO, also large hiatal hernia.  EXAM:  NAD, loud rushing bowel sounds, no frank abd tenderness  LABS: K+ slightly low; WBC improved.  IMPR:  Pt on antbx for acute-onset sx ?infectious enteritis but not clearly making much progress.  PLAN:  1. Re-consult surgery based on clinical and radiographic evolution over the past 2 days.  2. Consider NG placement; however, w/ large hiatal hernia, this might need to be placed under fluoro guidance to prevent looping within the hiatal hernia pouch.  3.  Have slightly increased IVF--BUN and Hgb are rising, suggesting some volume contraction.  Cleotis Nipper, M.D. Pager [phone] If no answer or after 5 PM call [phone]

## 2018-02-11 NOTE — Progress Notes (Signed)
(  please see my earlier note from today)  Discussed w/ Drs. Cornett and Gross of General Surgery.  Also discussed with Dr. Quincy Simmonds, hospitalist.  Dr. Johney Maine would like a trial of steroids to see if this could possibly Crohn's-related edema, so I have ordered a 3-day trial of Solu-Medrol, 40 mg IV every 12 hours.  Ideally, we would do colonoscopy to evaluate the terminal ileum before this patient would be subjected to exploratory surgery.  However, it appears on the KUB that there is stool in the proximal colon, which would have to be cleaned out in order to do a colonoscopy, and it may be difficult to prep this patient unless we can get her bowel to open up.  Updated plan:  1.  Increase IV fluids 2.  Dr. Quincy Simmonds to order supplemental potassium 3.  IV steroids (3-day course) ordered, with plan to assess whether bowel function improves after doing so 4.  Repeat blood work and abdominal films tomorrow.  Consider NG placement by radiology depending on patient's clinical evolution 5.  If the patient "opens up,", consider bowel prep and colonoscopy to evaluate terminal ileum sometime in the next several days 6.  If patient does not appear to open up, consider small bowel protocol, followed potentially by exploratory surgery  Cleotis Nipper, M.D. Pager [phone] If no answer or after 5 PM call [phone]

## 2018-02-11 NOTE — Plan of Care (Signed)
Plan of care discussed with patient

## 2018-02-11 NOTE — Progress Notes (Signed)
PROGRESS NOTE [facility]   Brooke Cox   VXY:[id number] DOB: 12-04-1940  DOA: 02/08/2018 PCP: [name], NP   Brief Narrative:  Brooke Cox is a 77 year old female with medical history significant for hypertension, CHF,  diverticulosis and GERD who presented to the emergency department complaining of severe abdominal pain.  Associated symptoms were nausea and vomiting.  Upon ED evaluation lab work-up was unremarkable.  CT of the abdomen and pelvis showed small bowel wall thickening and inflammatory change in the right lower quadrant with element of partial small bowel obstruction and dilated proximal small bowel loops. ?  Infectious versus inflammatory versus ischemia. A large hiatal hernia.  Patient was admitted with working diagnosis of partial small bowel obstruction secondary to infectious versus inflammatory process.   Subjective: Patient seen and examined, having some bilious emesis.  Per GI and surgical recommendation may need NG tube and small bowel protocol.  NG attempted by the nurse but look around her hernia.  Continues to have mild abdominal pain.  Denies flatus and bowel movement  Assessment & Plan: Partial small bowel obstruction in setting of infectious versus inflammatory process. Obstruction not resolving with antibiotic, general surgery and GI have recommended to give a trial of steroids for possible Crohn's disease.  Patient has been started on Solu-Medrol, will continue Zosyn.  Small bowel protocol has been ordered. GI panel pending, however patient has not had any bowel movement.  Will treat with anti-inflammatory for 48 hours, if no signs of improvement she may need ex lap.  Encourage ambulation and continue supportive treatment.  NG tube  will need to be placed via fluoroscopy.  Keep potassium above 4 and magnesium above 2.   Essential hypertension BP acceptable Continue metoprolol IV 10 mg every 6 hour Monitor BP closely  Chronic diastolic  dysfunction No signs of fluid overload Continue to monitor  Hepatic steatosis Incidental finding on CT abdomen/pelvis, also 2 lesions likely consistent with hemangiomata, recommend six-month follow-up with MRI as outpatient.   Endometrial thickening Incidental finding on CT abdomen and pelvis, recommend ultrasound as an outpatient.  DVT prophylaxis: SCDs Code Status: DNR Family Communication: Family at bedside Disposition Plan: To be determined  Consultants:   GI  General surgery  Procedures:   None  Antimicrobials: Anti-infectives (From admission, onward)   Start     Dose/Rate Route Frequency Ordered Stop   02/09/18 1600  piperacillin-tazobactam (ZOSYN) IVPB 3.375 g     3.375 g 12.5 mL/hr over 240 Minutes Intravenous Every 8 hours 02/09/18 1145     02/08/18 1200  Ampicillin-Sulbactam (UNASYN) 3 g in sodium chloride 0.9 % 100 mL IVPB  Status:  Discontinued     3 g 200 mL/hr over 30 Minutes Intravenous Every 6 hours 02/08/18 1127 02/09/18 1130         Objective: Vitals:   02/10/18 2210 02/11/18 0638 02/11/18 1006 02/11/18 1400  BP: (!) 158/91 (!) 143/74 (!) 130/98 (!) 151/86  Pulse: (!) 102 77 94 98  Resp: 18 18 17 18   Temp: 99.4 F (37.4 C) 99 F (37.2 C) 98.6 F (37 C) 98.6 F (37 C)  TempSrc: Oral Oral Oral Oral  SpO2: 90% 94% 96% 93%  Weight:  72.6 kg (160 lb 0.9 oz)    Height:        Intake/Output Summary (Last 24 hours) at 02/11/2018 1442 Last data filed at 02/11/2018 0950 Gross per 24 hour  Intake 1171.67 ml  Output 400 ml  Net 771.67 ml   Danley Danker  Weights   02/09/18 0510 02/10/18 0609 02/11/18 0638  Weight: 72.8 kg (160 lb 7.9 oz) 73.1 kg (161 lb 2.5 oz) 72.6 kg (160 lb 0.9 oz)    Examination:  General: Pt is alert, awake, not in acute distress Cardiovascular: RRR, S1/S2 +, no rubs, no gallops Respiratory: CTA bilaterally, no wheezing, no rhonchi Abdominal: Soft, mild distended, mild tenderness right lower quadrant.  Normal bowel sounds  appreciated Extremities: no edema  Data Reviewed: I have personally reviewed following labs and imaging studies  CBC: Recent Labs  Lab 02/06/18 0904 02/08/18 0500 02/09/18 0519 02/10/18 0451 02/11/18 0402  WBC 6.3 10.3 14.8* 14.2* 12.9*  NEUTROABS 4,114 8.6*  --  12.2*  --   HGB 12.2 11.7* 12.8 12.4 13.2  HCT 35.2 35.0* 38.8 37.9 38.8  MCV 89.3 92.1 93.7 93.1 91.3  PLT 300 271 293 287 720   Basic Metabolic Panel: Recent Labs  Lab 02/06/18 0904 02/08/18 0500 02/09/18 0519 02/10/18 0451 02/11/18 0402  NA 141 139 141 139 139  K 4.3 3.1* 3.7 3.4* 3.1*  CL 106 106 106 104 103  CO2 31 23 28 26 26   GLUCOSE 79 156* 130* 115* 112*  BUN 16 19 15 19  24*  CREATININE 0.66 0.78 0.58 0.70 0.68  CALCIUM 10.1 9.9 9.5 9.8 9.5  MG  --   --   --  2.2 2.0   GFR: Estimated Creatinine Clearance: 49.5 mL/min (by C-G formula based on SCr of 0.68 mg/dL). Liver Function Tests: Recent Labs  Lab 02/06/18 0904 02/08/18 0500  AST 15 16  ALT 9 13*  ALKPHOS  --  59  BILITOT 0.5 0.5  PROT 6.6 6.6  ALBUMIN  --  3.8   Recent Labs  Lab 02/08/18 0500  LIPASE 22   No results for input(s): AMMONIA in the last 168 hours. Coagulation Profile: No results for input(s): INR, PROTIME in the last 168 hours. Cardiac Enzymes: No results for input(s): CKTOTAL, CKMB, CKMBINDEX, TROPONINI in the last 168 hours. BNP (last 3 results) No results for input(s): PROBNP in the last 8760 hours. HbA1C: No results for input(s): HGBA1C in the last 72 hours. CBG: No results for input(s): GLUCAP in the last 168 hours. Lipid Profile: No results for input(s): CHOL, HDL, LDLCALC, TRIG, CHOLHDL, LDLDIRECT in the last 72 hours. Thyroid Function Tests: No results for input(s): TSH, T4TOTAL, FREET4, T3FREE, THYROIDAB in the last 72 hours. Anemia Panel: No results for input(s): VITAMINB12, FOLATE, FERRITIN, TIBC, IRON, RETICCTPCT in the last 72 hours. Sepsis Labs: Recent Labs  Lab 02/08/18 0536  LATICACIDVEN  2.06*    No results found for this or any previous visit (from the past 240 hour(s)).    Radiology Studies: Dg Abd 2 Views  Result Date: 02/10/2018 CLINICAL DATA:  Vomiting EXAM: ABDOMEN - 2 VIEW COMPARISON:  CT abdomen/pelvis from 02/08/2018 FINDINGS: Multiple dilated small bowel loops with air-fluid levels throughout the abdomen, most prominent in the left abdomen measuring up to 4.3 cm diameter, which appears slightly worsened since the 02/08/2018 CT abdomen scout topogram. Mild colonic stool. No evidence of pneumatosis or pneumoperitoneum. Large hiatal hernia. Stable patchy bibasilar lung opacities. Cholecystectomy clips are seen in the right upper quadrant of the abdomen. IMPRESSION: Multiple dilated small bowel loops with air-fluid levels throughout the abdomen, appearing slightly worsened since 02/08/2018 CT abdomen scout topogram, suggesting worsening distal small bowel obstruction. No evidence of pneumatosis or pneumoperitoneum. Large hiatal hernia with bibasilar lung opacities, favor atelectasis. Electronically Signed   By:  Ilona Sorrel M.D.   On: 02/10/2018 19:48   Dg Abd Portable 1v  Result Date: 02/11/2018 CLINICAL DATA:  Repositioning of the indwelling nasogastric tube at the patient's bedside. EXAM: PORTABLE ABDOMEN - 1 VIEW 1:15 p.m.: COMPARISON:  Portable abdomen x-ray earlier today at 12:16 p.m. Abdomen x-ray 02/10/2018. CT abdomen and pelvis 02/08/2018. FINDINGS: The nasogastric tube is looped in the patient's large hiatal hernia, still positioned above the diaphragm. Dilated loops of small bowel in the LEFT UPPER quadrant of the abdomen persist. Atelectasis in the lung bases is again noted. IMPRESSION: The nasogastric tube is looped in the patient's large hiatal hernia, still positioned in the intrathoracic portion of the stomach. Electronically Signed   By: Evangeline Dakin M.D.   On: 02/11/2018 13:45   Dg Abd Portable 1v-small Bowel Protocol-position Verification  Result Date:  02/11/2018 CLINICAL DATA:  Nasogastric tube placement. EXAM: PORTABLE ABDOMEN - 1 VIEW COMPARISON:  Abdominal plain film dated 02/10/2018. FINDINGS: NG tube tip is at the level of the gastroesophageal junction. Proximal side holes of the tube are at the level of the mid/lower esophagus. Mildly distended gas-filled loops of small bowel are partially imaged within the LEFT upper quadrant, likely stable compared to the earlier exam. IMPRESSION: Nasogastric tube tip is at the level of the gastroesophageal junction. Recommend advancing approximately 10 cm for optimal radiographic positioning Electronically Signed   By: Franki Cabot M.D.   On: 02/11/2018 12:45    Scheduled Meds: . bisacodyl  10 mg Rectal Daily  . diatrizoate meglumine-sodium  90 mL Per NG tube Once  . enoxaparin (LOVENOX) injection  40 mg Subcutaneous Q24H  . lip balm  1 application Topical BID  . methylPREDNISolone (SOLU-MEDROL) injection  40 mg Intravenous Q12H  . metoprolol tartrate  10 mg Intravenous Q6H   Continuous Infusions: . dextrose 5 % and 0.45 % NaCl with KCl 40 mEq/L    . lactated ringers    . methocarbamol (ROBAXIN)  IV    . ondansetron (ZOFRAN) IV    . piperacillin-tazobactam (ZOSYN)  IV 3.375 g (02/11/18 0802)  . potassium chloride       LOS: 3 days    Time spent: Total of 25 minutes spent with pt, greater than 50% of which was spent in discussion of  treatment, counseling and coordination of care   Chipper Oman, MD Pager: Text Page via [email or web address]   If 7PM-7AM, please contact night-coverage [email or web address] 02/11/2018, 2:42 PM   Note - This record has been created using [employer]. Chart creation errors have been sought, but may not always have been located. Such creation errors do not reflect on the standard of medical care.

## 2018-02-11 NOTE — Progress Notes (Signed)
Brooke Cox [id number] 12/08/1940  CARE TEAM:  PCP: [name], NP  Outpatient Care Team: Patient Care Team: [name], NP as PCP - General (Geriatric Medicine)  Inpatient Treatment Team: Treatment Team: Attending Provider: Patrecia Pour, Christean Grief, MD; Rounding Team: Edison Pace, Md, MD; Technician: Reuel Derby, NT; Rounding Team: Ian Bushman, MD; Consulting Physician: Wonda Horner, MD; Technician: Etheleen Sia, NT; Technician: Abbe Amsterdam, NT   Problem List:   Principal Problem:   Ileitis, terminal [facility]) Active Problems:   Benign hypertensive heart disease without heart failure   Hyperlipidemia with target LDL less than 130   Sigmoid diverticulosis   Obesity (BMI 30.0-34.9)   Partial small bowel obstruction (HCC)      * No surgery found *     Assessment  Obstructive like symptoms with terminal ileal thickening not responsive to antibiotics.  Seems more inflammatory thickening such as autoimmune, ischemic, infective.  Plan:  She is not improved antibiotic alone, arguing against a bacterial infective etiology.  It does not make much sense for her terminal ileum to be ischemic while her colon does not since her SMA pedicle is the one of the 3 visceral pedicles that is patent and functioning.  Nonetheless that should have improved with bowel rest and antibiotics as well.  She is not acting toxic with peritonitis.  Discomfort pain is minimal.  Discussed with gastroenterology.  Although it would be atypical for her age group to have Crohn's, I still am concerned about this.  Trial of steroids to see if that will decrease inflammation allow things to open up.  Placed on small bowel protocol since she is distended and having emesis with clinical obstipation.  If she does not improve on steroids and bowel protocol within 48 hours, may need operative exploration.  -VTE prophylaxis- SCDs, etc -mobilize as tolerated to help recovery  30 minutes spent in  review, evaluation, examination, counseling, and coordination of care.  More than 50% of that time was spent in counseling.  Adin Hector, MD, FACS, MASCRS Gastrointestinal and Minimally Invasive Surgery    [address], [place] [address] [place], [place] [address] [phone] Main / Paging [phone] Fax   02/11/2018    Subjective: (Chief complaint)  Some abdominal discomfort in lower abdomen.  Not severe though.  Bloated and nauseated.  Still vomiting.  Family in room  Dr. Cristina Gong with gastroenterology just saw patient  Objective:  Vital signs:  Vitals:   02/10/18 1854 02/10/18 2210 02/11/18 0638 02/11/18 1006  BP: (!) 154/85 (!) 158/91 (!) 143/74 (!) 130/98  Pulse: 100 (!) 102 77 94  Resp:  _0 Temp:  99.4 F (37.4 C) 99 F (37.2 C) 98.6 F (37 C)  TempSrc:  Oral Oral Oral  SpO2:  90% 94% 96%  Weight:   72.6 kg (160 lb 0.9 oz)   Height:        Last BM Date: 02/08/18  Intake/Output   Yesterday:  06/01 0701 - 06/02 0700 In: 1271.7 [I.V.:1171.7; IV Piggyback:100] Out: 200 [Urine:200] This shift:  Total I/O In: -  Out: 400 [Urine:400]  Bowel function:  Flatus: No  BM:  No  Drain: (No drain)   Physical Exam:  General: Pt awake/alert/oriented x4 in no acute distress.  Up in chair Eyes: PERRL, normal EOM.  Sclera clear.  No icterus Neuro: CN II-XII intact w/o focal sensory/motor deficits. Lymph: No head/neck/groin lymphadenopathy Psych:  No delerium/psychosis/paranoia HENT: Normocephalic, Mucus membranes moist.  No thrush Neck: Supple, No tracheal deviation Chest: No chest wall pain w good excursion CV:  Pulses intact.  Regular rhythm MS: Normal AROM mjr joints.  No obvious deformity  Abdomen: Somewhat firm.  Moderately distended.  Tenderness at RLQ only.  No guarding.  No evidence of peritonitis.  No incarcerated hernias.  Ext:  No deformity.  No mjr edema.  No cyanosis Skin: No petechiae / purpura  Results:   Labs: Results  for orders placed or performed during the hospital encounter of 02/08/18 (from the past 48 hour(s))  CBC with Differential/Platelet     Status: Abnormal   Collection Time: 02/10/18  4:51 AM  Result Value Ref Range   WBC 14.2 (H) 4.0 - 10.5 K/uL   RBC 4.07 3.87 - 5.11 MIL/uL   Hemoglobin 12.4 12.0 - 15.0 g/dL   HCT 37.9 36.0 - 46.0 %   MCV 93.1 78.0 - 100.0 fL   MCH 30.5 26.0 - 34.0 pg   MCHC 32.7 30.0 - 36.0 g/dL   RDW 13.3 11.5 - 15.5 %   Platelets 287 150 - 400 K/uL   Neutrophils Relative % 86 %   Neutro Abs 12.2 (H) 1.7 - 7.7 K/uL   Lymphocytes Relative 7 %   Lymphs Abs 1.0 0.7 - 4.0 K/uL   Monocytes Relative 7 %   Monocytes Absolute 1.0 0.1 - 1.0 K/uL   Eosinophils Relative 0 %   Eosinophils Absolute 0.0 0.0 - 0.7 K/uL   Basophils Relative 0 %   Basophils Absolute 0.0 0.0 - 0.1 K/uL    Comment: Performed at [facility], [place] [address] [place], [place] [address]  Basic metabolic panel     Status: Abnormal   Collection Time: 02/10/18  4:51 AM  Result Value Ref Range   Sodium 139 135 - 145 mmol/L   Potassium 3.4 (L) 3.5 - 5.1 mmol/L   Chloride 104 101 - 111 mmol/L   CO2 26 22 - 32 mmol/L   Glucose, Bld 115 (H) 65 - 99 mg/dL   BUN 19 6 - 20 mg/dL   Creatinine, Ser 0.70 0.44 - 1.00 mg/dL   Calcium 9.8 8.9 - 10.3 mg/dL   GFR calc non Af Amer >60 >60 mL/min   GFR calc Af Amer >60 >60 mL/min    Comment: (NOTE) The eGFR has been calculated using the CKD EPI equation. This calculation has not been validated in all clinical situations. eGFR's persistently <60 mL/min signify possible Chronic Kidney Disease.    Anion gap 9 5 - 15    Comment: Performed at [facility], [place] [address] [place], [place] [address]  Magnesium     Status: None   Collection Time: 02/10/18  4:51 AM  Result Value Ref Range   Magnesium 2.2 1.7 - 2.4 mg/dL    Comment: Performed at [facility], [place] [address] [place], [place] [address]  Basic  metabolic panel     Status: Abnormal   Collection Time: 02/11/18  4:02 AM  Result Value Ref Range   Sodium 139 135 - 145 mmol/L   Potassium 3.1 (L) 3.5 - 5.1 mmol/L   Chloride 103 101 - 111 mmol/L   CO2 26 22 - 32 mmol/L   Glucose, Bld 112 (H) 65 - 99 mg/dL   BUN 24 (H) 6 - 20 mg/dL   Creatinine, Ser 0.68 0.44 - 1.00 mg/dL   Calcium 9.5 8.9 - 10.3 mg/dL   GFR calc non Af  Amer >60 >60 mL/min   GFR calc Af Amer >60 >60 mL/min    Comment: (NOTE) The eGFR has been calculated using the CKD EPI equation. This calculation has not been validated in all clinical situations. eGFR's persistently <60 mL/min signify possible Chronic Kidney Disease.    Anion gap 10 5 - 15    Comment: Performed at [facility], [place] [address] [place], [place] [address]  CBC     Status: Abnormal   Collection Time: 02/11/18  4:02 AM  Result Value Ref Range   WBC 12.9 (H) 4.0 - 10.5 K/uL   RBC 4.25 3.87 - 5.11 MIL/uL   Hemoglobin 13.2 12.0 - 15.0 g/dL   HCT 38.8 36.0 - 46.0 %   MCV 91.3 78.0 - 100.0 fL   MCH 31.1 26.0 - 34.0 pg   MCHC 34.0 30.0 - 36.0 g/dL   RDW 13.2 11.5 - 15.5 %   Platelets 292 150 - 400 K/uL    Comment: Performed at [facility], [place] [address], [place], [place] [address]  Magnesium     Status: None   Collection Time: 02/11/18  4:02 AM  Result Value Ref Range   Magnesium 2.0 1.7 - 2.4 mg/dL    Comment: Performed at [facility], [place] [address], [place], [place] [address]    Imaging / Studies: Dg Abd 2 Views  Result Date: 02/10/2018 CLINICAL DATA:  Vomiting EXAM: ABDOMEN - 2 VIEW COMPARISON:  CT abdomen/pelvis from 02/08/2018 FINDINGS: Multiple dilated small bowel loops with air-fluid levels throughout the abdomen, most prominent in the left abdomen measuring up to 4.3 cm diameter, which appears slightly worsened since the 02/08/2018 CT abdomen scout topogram. Mild colonic stool. No evidence of pneumatosis or pneumoperitoneum. Large  hiatal hernia. Stable patchy bibasilar lung opacities. Cholecystectomy clips are seen in the right upper quadrant of the abdomen. IMPRESSION: Multiple dilated small bowel loops with air-fluid levels throughout the abdomen, appearing slightly worsened since 02/08/2018 CT abdomen scout topogram, suggesting worsening distal small bowel obstruction. No evidence of pneumatosis or pneumoperitoneum. Large hiatal hernia with bibasilar lung opacities, favor atelectasis. Electronically Signed   By: Ilona Sorrel M.D.   On: 02/10/2018 19:48    Medications / Allergies: per chart  Antibiotics: Anti-infectives (From admission, onward)   Start     Dose/Rate Route Frequency Ordered Stop   02/09/18 1600  piperacillin-tazobactam (ZOSYN) IVPB 3.375 g     3.375 g 12.5 mL/hr over 240 Minutes Intravenous Every 8 hours 02/09/18 1145     02/08/18 1200  Ampicillin-Sulbactam (UNASYN) 3 g in sodium chloride 0.9 % 100 mL IVPB  Status:  Discontinued     3 g 200 mL/hr over 30 Minutes Intravenous Every 6 hours 02/08/18 1127 02/09/18 1130        Note: Portions of this report may have been transcribed using voice recognition software. Every effort was made to ensure accuracy; however, inadvertent computerized transcription errors may be present.   Any transcriptional errors that result from this process are unintentional.     Adin Hector, MD,FACS, [place] [address] [place], [place] [address] Main: [place]: [phone]  02/11/2018

## 2018-02-12 ENCOUNTER — Inpatient Hospital Stay (HOSPITAL_COMMUNITY): Payer: Medicare Other

## 2018-02-12 LAB — HEPATIC FUNCTION PANEL
ALT: 41 U/L (ref 14–54)
AST: 55 U/L — ABNORMAL HIGH (ref 15–41)
Albumin: 2.9 g/dL — ABNORMAL LOW (ref 3.5–5.0)
Alkaline Phosphatase: 61 U/L (ref 38–126)
BILIRUBIN DIRECT: 0.1 mg/dL (ref 0.1–0.5)
BILIRUBIN INDIRECT: 0.7 mg/dL (ref 0.3–0.9)
BILIRUBIN TOTAL: 0.8 mg/dL (ref 0.3–1.2)
Total Protein: 5.9 g/dL — ABNORMAL LOW (ref 6.5–8.1)

## 2018-02-12 LAB — BASIC METABOLIC PANEL
ANION GAP: 9 (ref 5–15)
BUN: 22 mg/dL — AB (ref 6–20)
CALCIUM: 9.2 mg/dL (ref 8.9–10.3)
CO2: 25 mmol/L (ref 22–32)
CREATININE: 0.6 mg/dL (ref 0.44–1.00)
Chloride: 103 mmol/L (ref 101–111)
GFR calc Af Amer: >60 mL/min (ref >60)
GFR calc non Af Amer: >60 mL/min (ref >60)
GLUCOSE: 141 mg/dL — AB (ref 65–99)
Potassium: 3.2 mmol/L — ABNORMAL LOW (ref 3.5–5.1)
Sodium: 137 mmol/L (ref 135–145)

## 2018-02-12 LAB — CBC WITH DIFFERENTIAL/PLATELET
Basophils Absolute: 0 10*3/uL (ref 0.0–0.1)
Basophils Relative: 0 %
Eosinophils Absolute: 0 10*3/uL (ref 0.0–0.7)
Eosinophils Relative: 0 %
HCT: 36.5 % (ref 36.0–46.0)
Hemoglobin: 12.3 g/dL (ref 12.0–15.0)
LYMPHS ABS: 0.6 10*3/uL — AB (ref 0.7–4.0)
Lymphocytes Relative: 6 %
MCH: 30.8 pg (ref 26.0–34.0)
MCHC: 33.7 g/dL (ref 30.0–36.0)
MCV: 91.5 fL (ref 78.0–100.0)
MONO ABS: 0.2 10*3/uL (ref 0.1–1.0)
Monocytes Relative: 2 %
Neutro Abs: 10 10*3/uL — ABNORMAL HIGH (ref 1.7–7.7)
Neutrophils Relative %: 92 %
PLATELETS: 297 10*3/uL (ref 150–400)
RBC: 3.99 MIL/uL (ref 3.87–5.11)
RDW: 12.8 % (ref 11.5–15.5)
WBC: 10.8 10*3/uL — AB (ref 4.0–10.5)

## 2018-02-12 LAB — MAGNESIUM: Magnesium: 2 mg/dL (ref 1.7–2.4)

## 2018-02-12 MED ORDER — POTASSIUM CHLORIDE 10 MEQ/100ML IV SOLN
10.0000 meq | INTRAVENOUS | Status: AC
  Administered 2018-02-12 (×4): 10 meq via INTRAVENOUS
  Filled 2018-02-12 (×4): qty 100

## 2018-02-12 MED ORDER — IOPAMIDOL (ISOVUE-300) INJECTION 61%
INTRAVENOUS | Status: AC
  Filled 2018-02-12: qty 50

## 2018-02-12 MED ORDER — LIDOCAINE HCL URETHRAL/MUCOSAL 2 % EX GEL
CUTANEOUS | Status: AC
  Filled 2018-02-12: qty 30

## 2018-02-12 MED ORDER — BUTAMBEN-TETRACAINE-BENZOCAINE 2-2-14 % EX AERO
INHALATION_SPRAY | CUTANEOUS | Status: AC
  Filled 2018-02-12: qty 20

## 2018-02-12 NOTE — Plan of Care (Signed)
Patient was positive for flatus today however still not much bowel activity.  Patient to go down to radiology today to have a NG tube placed under fluroscopy

## 2018-02-12 NOTE — Progress Notes (Signed)
Subjective: The patient was seen and examined at bedside. The NG tube was repositioned with IR guidance.She reports a small amount of bilious emesis today and passed gas today morning.  Objective: Vital signs in last 24 hours: Temp:  [98.1 F (36.7 C)-98.7 F (37.1 C)] 98.1 F (36.7 C) (06/03 0607) Pulse Rate:  [82-98] 82 (06/03 0607) Resp:  [14-18] 14 (06/03 0607) BP: (139-151)/(70-86) 147/70 (06/03 0607) SpO2:  [93 %-95 %] 95 % (06/03 0607) Weight:  [73.9 kg (163 lb)] 73.9 kg (163 lb) (06/03 0607) Weight change: 1.336 kg (2 lb 15.1 oz) Last BM Date: 02/08/18  PE:not in distress GENERAL:dry oral mucosa ABDOMEN:soft, nondistended, nontender, sluggish bowel sounds heard occasionally EXTREMITIES:no edema  Lab Results: Results for orders placed or performed during the hospital encounter of 02/08/18 (from the past 48 hour(s))  Basic metabolic panel     Status: Abnormal   Collection Time: 02/11/18  4:02 AM  Result Value Ref Range   Sodium 139 135 - 145 mmol/L   Potassium 3.1 (L) 3.5 - 5.1 mmol/L   Chloride 103 101 - 111 mmol/L   CO2 26 22 - 32 mmol/L   Glucose, Bld 112 (H) 65 - 99 mg/dL   BUN 24 (H) 6 - 20 mg/dL   Creatinine, Ser 0.68 0.44 - 1.00 mg/dL   Calcium 9.5 8.9 - 10.3 mg/dL   GFR calc non Af Amer >60 >60 mL/min   GFR calc Af Amer >60 >60 mL/min    Comment: (NOTE) The eGFR has been calculated using the CKD EPI equation. This calculation has not been validated in all clinical situations. eGFR's persistently <60 mL/min signify possible Chronic Kidney Disease.    Anion gap 10 5 - 15    Comment: Performed at [facility], [place] [address] [place], [place] [address]  CBC     Status: Abnormal   Collection Time: 02/11/18  4:02 AM  Result Value Ref Range   WBC 12.9 (H) 4.0 - 10.5 K/uL   RBC 4.25 3.87 - 5.11 MIL/uL   Hemoglobin 13.2 12.0 - 15.0 g/dL   HCT 38.8 36.0 - 46.0 %   MCV 91.3 78.0 - 100.0 fL   MCH 31.1 26.0 - 34.0 pg   MCHC 34.0 30.0 - 36.0  g/dL   RDW 13.2 11.5 - 15.5 %   Platelets 292 150 - 400 K/uL    Comment: Performed at [facility], [place] [address], [place], [place] [address]  Magnesium     Status: None   Collection Time: 02/11/18  4:02 AM  Result Value Ref Range   Magnesium 2.0 1.7 - 2.4 mg/dL    Comment: Performed at [facility], [place] [address], [place], [place] [address]  CBC with Differential/Platelet     Status: Abnormal   Collection Time: 02/12/18  4:17 AM  Result Value Ref Range   WBC 10.8 (H) 4.0 - 10.5 K/uL   RBC 3.99 3.87 - 5.11 MIL/uL   Hemoglobin 12.3 12.0 - 15.0 g/dL   HCT 36.5 36.0 - 46.0 %   MCV 91.5 78.0 - 100.0 fL   MCH 30.8 26.0 - 34.0 pg   MCHC 33.7 30.0 - 36.0 g/dL   RDW 12.8 11.5 - 15.5 %   Platelets 297 150 - 400 K/uL   Neutrophils Relative % 92 %   Lymphocytes Relative 6 %   Monocytes Relative 2 %   Eosinophils Relative 0 %   Basophils Relative 0 %   Neutro  Abs 10.0 (H) 1.7 - 7.7 K/uL   Lymphs Abs 0.6 (L) 0.7 - 4.0 K/uL   Monocytes Absolute 0.2 0.1 - 1.0 K/uL   Eosinophils Absolute 0.0 0.0 - 0.7 K/uL   Basophils Absolute 0.0 0.0 - 0.1 K/uL   Smear Review MORPHOLOGY UNREMARKABLE     Comment: Performed at [facility], [place] [address], [place], [place] [address]  Basic metabolic panel     Status: Abnormal   Collection Time: 02/12/18  4:17 AM  Result Value Ref Range   Sodium 137 135 - 145 mmol/L   Potassium 3.2 (L) 3.5 - 5.1 mmol/L   Chloride 103 101 - 111 mmol/L   CO2 25 22 - 32 mmol/L   Glucose, Bld 141 (H) 65 - 99 mg/dL   BUN 22 (H) 6 - 20 mg/dL   Creatinine, Ser 0.60 0.44 - 1.00 mg/dL   Calcium 9.2 8.9 - 10.3 mg/dL   GFR calc non Af Amer >60 >60 mL/min   GFR calc Af Amer >60 >60 mL/min    Comment: (NOTE) The eGFR has been calculated using the CKD EPI equation. This calculation has not been validated in all clinical situations. eGFR's persistently <60 mL/min signify possible Chronic Kidney Disease.    Anion gap 9 5 -  15    Comment: Performed at [facility], [place] [address] [place], [place] [address]  Magnesium     Status: None   Collection Time: 02/12/18  4:17 AM  Result Value Ref Range   Magnesium 2.0 1.7 - 2.4 mg/dL    Comment: Performed at [facility], [place] [address] [place], [place] [address]  Hepatic function panel     Status: Abnormal   Collection Time: 02/12/18  4:17 AM  Result Value Ref Range   Total Protein 5.9 (L) 6.5 - 8.1 g/dL   Albumin 2.9 (L) 3.5 - 5.0 g/dL   AST 55 (H) 15 - 41 U/L   ALT 41 14 - 54 U/L   Alkaline Phosphatase 61 38 - 126 U/L   Total Bilirubin 0.8 0.3 - 1.2 mg/dL   Bilirubin, Direct 0.1 0.1 - 0.5 mg/dL   Indirect Bilirubin 0.7 0.3 - 0.9 mg/dL    Comment: Performed at [facility], [place] [address] [place], [place] [address]    Studies/Results: Dg Abd 2 Views  Result Date: 02/10/2018 CLINICAL DATA:  Vomiting EXAM: ABDOMEN - 2 VIEW COMPARISON:  CT abdomen/pelvis from 02/08/2018 FINDINGS: Multiple dilated small bowel loops with air-fluid levels throughout the abdomen, most prominent in the left abdomen measuring up to 4.3 cm diameter, which appears slightly worsened since the 02/08/2018 CT abdomen scout topogram. Mild colonic stool. No evidence of pneumatosis or pneumoperitoneum. Large hiatal hernia. Stable patchy bibasilar lung opacities. Cholecystectomy clips are seen in the right upper quadrant of the abdomen. IMPRESSION: Multiple dilated small bowel loops with air-fluid levels throughout the abdomen, appearing slightly worsened since 02/08/2018 CT abdomen scout topogram, suggesting worsening distal small bowel obstruction. No evidence of pneumatosis or pneumoperitoneum. Large hiatal hernia with bibasilar lung opacities, favor atelectasis. Electronically Signed   By: Ilona Sorrel M.D.   On: 02/10/2018 19:48   Dg Abd Portable 1v  Result Date: 02/11/2018 CLINICAL DATA:  Repositioning of the indwelling nasogastric tube  at the patient's bedside. EXAM: PORTABLE ABDOMEN - 1 VIEW 1:15 p.m.: COMPARISON:  Portable abdomen x-ray earlier today at 12:16 p.m. Abdomen x-ray 02/10/2018. CT abdomen and pelvis 02/08/2018. FINDINGS: The nasogastric tube is  looped in the patient's large hiatal hernia, still positioned above the diaphragm. Dilated loops of small bowel in the LEFT UPPER quadrant of the abdomen persist. Atelectasis in the lung bases is again noted. IMPRESSION: The nasogastric tube is looped in the patient's large hiatal hernia, still positioned in the intrathoracic portion of the stomach. Electronically Signed   By: Evangeline Dakin M.D.   On: 02/11/2018 13:45   Dg Abd Portable 1v-small Bowel Protocol-position Verification  Result Date: 02/11/2018 CLINICAL DATA:  Nasogastric tube placement. EXAM: PORTABLE ABDOMEN - 1 VIEW COMPARISON:  Abdominal plain film dated 02/10/2018. FINDINGS: NG tube tip is at the level of the gastroesophageal junction. Proximal side holes of the tube are at the level of the mid/lower esophagus. Mildly distended gas-filled loops of small bowel are partially imaged within the LEFT upper quadrant, likely stable compared to the earlier exam. IMPRESSION: Nasogastric tube tip is at the level of the gastroesophageal junction. Recommend advancing approximately 10 cm for optimal radiographic positioning Electronically Signed   By: Franki Cabot M.D.   On: 02/11/2018 12:45    Medications: I have reviewed the patient's current medications.  Assessment: Distal small bowel obstruction-on D5 half-normal saline with 40 mEqKCl at 100 mL an hour,remains nothing by mouth,and started on IV Solu-Medrol 40 mg every 12 hours since yesterday.  Plan: With repositioning of the NG tube and connection to low intermittent suction and IV steroids, if patient reports increase in flatus and has bowel movements, we may consider a colonoscopy in the next several days to evaluate the terminal ileum.  On the other hand, if the  patient has no improvement in small bowel obstruction on daily abdominal x-rays, recommend a small bowel follow-through and likely exposed to surgery.  Meanwhile, recommend increasing IV potassium supplementation, plan to keep potassium at least above 4.   Ronnette Juniper 02/12/2018, 11:46 AM   Pager [phone] If no answer or after 5 PM call [phone]

## 2018-02-12 NOTE — Plan of Care (Signed)
Plan of care discussed with patient

## 2018-02-12 NOTE — Progress Notes (Signed)
PROGRESS NOTE [facility]   Jaya Lapka   QVZ:[id number] DOB: Jun 02, 1941  DOA: 02/08/2018 PCP: [name], NP   Brief Narrative:  Brooke Cox is a 77 year old female with medical history significant for hypertension, CHF,  diverticulosis and GERD who presented to the emergency department complaining of severe abdominal pain.  Associated symptoms were nausea and vomiting.  Upon ED evaluation lab work-up was unremarkable.  CT of the abdomen and pelvis showed small bowel wall thickening and inflammatory change in the right lower quadrant with element of partial small bowel obstruction and dilated proximal small bowel loops. ?  Infectious versus inflammatory versus ischemia. A large hiatal hernia.  Patient was admitted with working diagnosis of partial small bowel obstruction secondary to infectious versus inflammatory process.    Subjective: Seen and examined at bedside.  Had a small amount of bilious emesis this morning. Passed gas this AM.  NG tube has been repositioned via fluoroscopy  Assessment & Plan: Partial small bowel obstruction in setting of infectious versus inflammatory process. Obstruction not resolving with antibiotic, general surgery and GI have recommended to give a trial of steroids for ? Crohn's disease.  Continue Solu-Medrol, will continue Zosyn.  Small bowel protocol.  We will continue to monitor, if clinical improvement may need colonoscopy to check terminal ileum, if not clinical improvement may need ex lap. Keep potassium above 4 and magnesium above 2.   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   Endometrial  thickening Incidental finding on CT abdomen and pelvis, recommend ultrasound as an outpatient.  DVT prophylaxis: SCDs Code Status: DNR Family Communication: Family at bedside Disposition Plan: To be determined  Consultants:   GI  General surgery  Procedures:   None  Antimicrobials: Anti-infectives (From admission, onward)   Start     Dose/Rate Route Frequency Ordered Stop   02/09/18 1600  piperacillin-tazobactam (ZOSYN) IVPB 3.375 g     3.375 g 12.5 mL/hr over 240 Minutes Intravenous Every 8 hours 02/09/18 1145     02/08/18 1200  Ampicillin-Sulbactam (UNASYN) 3 g in sodium chloride 0.9 % 100 mL IVPB  Status:  Discontinued     3 g 200 mL/hr over 30 Minutes Intravenous Every 6 hours 02/08/18 1127 02/09/18 1130         Objective: Vitals:   02/11/18 1400 02/11/18 2144 02/12/18 0607 02/12/18 1419  BP: (!) 151/86 139/74 (!) 147/70 (!) 161/93  Pulse: 98 83 82 84  Resp: 18 15 14 16   Temp: 98.6 F (37 C) 98.7 F (37.1 C) 98.1 F (36.7 C) (!) 97.5 F (36.4 C)  TempSrc: Oral Oral Oral Oral  SpO2: 93% 93% 95%   Weight:   73.9 kg (163 lb)   Height:        Intake/Output Summary (Last 24 hours) at 02/12/2018 1501 Last data filed at 02/12/2018 0900 Gross per 24 hour  Intake 1596.67 ml  Output 200 ml  Net 1396.67 ml   Filed Weights   02/10/18 0609 02/11/18 0638 02/12/18 0607  Weight: 73.1 kg (161 lb 2.5 oz) 72.6 kg (160 lb 0.9 oz) 73.9 kg (163 lb)    Examination:  General: NAD Cardiovascular: RRR, S1/S2 +, no rubs, no gallops Respiratory: CTA bilaterally, no wheezing, no rhonchi  Abdominal: Soft, mild bowel sounds.  Mild tenderness in the right lower quadrant Extremities: no edema  Data Reviewed: I have personally reviewed following labs and imaging studies  CBC: Recent Labs  Lab 02/06/18 0904 02/08/18 0500 02/09/18 0519 02/10/18 0451 02/11/18 0402 02/12/18 0417  WBC 6.3 10.3 14.8* 14.2* 12.9* 10.8*  NEUTROABS 4,114 8.6*  --  12.2*  --  10.0*  HGB 12.2  11.7* 12.8 12.4 13.2 12.3  HCT 35.2 35.0* 38.8 37.9 38.8 36.5  MCV 89.3 92.1 93.7 93.1 91.3 91.5  PLT 300 271 293 287 292 335   Basic Metabolic Panel: Recent Labs  Lab 02/08/18 0500 02/09/18 0519 02/10/18 0451 02/11/18 0402 02/12/18 0417  NA 139 141 139 139 137  K 3.1* 3.7 3.4* 3.1* 3.2*  CL 106 106 104 103 103  CO2 23 28 26 26 25   GLUCOSE 156* 130* 115* 112* 141*  BUN 19 15 19  24* 22*  CREATININE 0.78 0.58 0.70 0.68 0.60  CALCIUM 9.9 9.5 9.8 9.5 9.2  MG  --   --  2.2 2.0 2.0   GFR: Estimated Creatinine Clearance: 50 mL/min (by C-G formula based on SCr of 0.6 mg/dL). Liver Function Tests: Recent Labs  Lab 02/06/18 0904 02/08/18 0500 02/12/18 0417  AST 15 16 55*  ALT 9 13* 41  ALKPHOS  --  59 61  BILITOT 0.5 0.5 0.8  PROT 6.6 6.6 5.9*  ALBUMIN  --  3.8 2.9*   Recent Labs  Lab 02/08/18 0500  LIPASE 22   No results for input(s): AMMONIA in the last 168 hours. Coagulation Profile: No results for input(s): INR, PROTIME in the last 168 hours. Cardiac Enzymes: No results for input(s): CKTOTAL, CKMB, CKMBINDEX, TROPONINI in the last 168 hours. BNP (last 3 results) No results for input(s): PROBNP in the last 8760 hours. HbA1C: No results for input(s): HGBA1C in the last 72 hours. CBG: No results for input(s): GLUCAP in the last 168 hours. Lipid Profile: No results for input(s): CHOL, HDL, LDLCALC, TRIG, CHOLHDL, LDLDIRECT in the last 72 hours. Thyroid Function Tests: No results for input(s): TSH, T4TOTAL, FREET4, T3FREE, THYROIDAB in the last 72 hours. Anemia Panel: No results for input(s): VITAMINB12, FOLATE, FERRITIN, TIBC, IRON, RETICCTPCT in the last 72 hours. Sepsis Labs: Recent Labs  Lab 02/08/18 0536  LATICACIDVEN 2.06*    No results found for this or any previous visit (from the past 240 hour(s)).    Radiology Studies: Dg Abd 2 Views  Result Date: 02/10/2018 CLINICAL DATA:  Vomiting EXAM: ABDOMEN - 2 VIEW COMPARISON:  CT abdomen/pelvis from  02/08/2018 FINDINGS: Multiple dilated small bowel loops with air-fluid levels throughout the abdomen, most prominent in the left abdomen measuring up to 4.3 cm diameter, which appears slightly worsened since the 02/08/2018 CT abdomen scout topogram. Mild colonic stool. No evidence of pneumatosis or pneumoperitoneum. Large hiatal hernia. Stable patchy bibasilar lung opacities. Cholecystectomy clips are seen in the right upper quadrant of the abdomen. IMPRESSION: Multiple dilated small bowel loops with air-fluid levels throughout the abdomen, appearing slightly worsened since 02/08/2018 CT abdomen scout topogram, suggesting worsening distal small bowel obstruction. No evidence of pneumatosis or pneumoperitoneum. Large hiatal hernia with bibasilar lung opacities, favor atelectasis. Electronically Signed   By: Ilona Sorrel M.D.   On: 02/10/2018 19:48   Dg Abd Portable 1v  Result Date: 02/11/2018 CLINICAL DATA:  Repositioning of the indwelling nasogastric tube at the patient's bedside. EXAM: PORTABLE ABDOMEN - 1 VIEW 1:15 p.m.: COMPARISON:  Portable abdomen x-ray  earlier today at 12:16 p.m. Abdomen x-ray 02/10/2018. CT abdomen and pelvis 02/08/2018. FINDINGS: The nasogastric tube is looped in the patient's large hiatal hernia, still positioned above the diaphragm. Dilated loops of small bowel in the LEFT UPPER quadrant of the abdomen persist. Atelectasis in the lung bases is again noted. IMPRESSION: The nasogastric tube is looped in the patient's large hiatal hernia, still positioned in the intrathoracic portion of the stomach. Electronically Signed   By: Evangeline Dakin M.D.   On: 02/11/2018 13:45   Dg Abd Portable 1v-small Bowel Protocol-position Verification  Result Date: 02/11/2018 CLINICAL DATA:  Nasogastric tube placement. EXAM: PORTABLE ABDOMEN - 1 VIEW COMPARISON:  Abdominal plain film dated 02/10/2018. FINDINGS: NG tube tip is at the level of the gastroesophageal junction. Proximal side holes of the tube  are at the level of the mid/lower esophagus. Mildly distended gas-filled loops of small bowel are partially imaged within the LEFT upper quadrant, likely stable compared to the earlier exam. IMPRESSION: Nasogastric tube tip is at the level of the gastroesophageal junction. Recommend advancing approximately 10 cm for optimal radiographic positioning Electronically Signed   By: Franki Cabot M.D.   On: 02/11/2018 12:45   Dg Intro Long Gi Tube  Result Date: 02/12/2018 CLINICAL DATA:  NG tube placement for small bowel obstruction. EXAM: INTRO LONG GI TUBE CONTRAST:  30 cc of Isovue-300 FLUOROSCOPY TIME:  Fluoroscopy Time:  2 minutes 45 seconds Radiation Exposure Index (if provided by the fluoroscopic device): 33.18 Number of Acquired Spot Images: 1 COMPARISON:  None. FINDINGS: Under fluoroscopic guidance the nasogastric tube was placed past the hiatal hernia and into the distal stomach. Contrast injection of the nasogastric tube confirms placement. IMPRESSION: Technically successful image guided placement of nasogastric tube with tip in the distal stomach. Electronically Signed   By: Kerby Moors M.D.   On: 02/12/2018 14:41    Scheduled Meds: . bisacodyl  10 mg Rectal Daily  . diatrizoate meglumine-sodium  90 mL Per NG tube Once  . enoxaparin (LOVENOX) injection  40 mg Subcutaneous Q24H  . iopamidol      . lip balm  1 application Topical BID  . methylPREDNISolone (SOLU-MEDROL) injection  40 mg Intravenous Q12H  . metoprolol tartrate  10 mg Intravenous Q6H   Continuous Infusions: . dextrose 5 % and 0.45 % NaCl with KCl 40 mEq/L 100 mL/hr at 02/12/18 1357  . methocarbamol (ROBAXIN)  IV    . ondansetron (ZOFRAN) IV    . piperacillin-tazobactam (ZOSYN)  IV Stopped (02/12/18 1345)  . potassium chloride 10 mEq (02/12/18 1428)     LOS: 4 days   Time spent: Total of 25 minutes spent with pt, greater than 50% of which was spent in discussion of  treatment, counseling and coordination of care  Chipper Oman, MD Pager: Text Page via [email or web address]   If 7PM-7AM, please contact night-coverage [email or web address] 02/12/2018, 3:01 PM   Note - This record has been created using [employer]. Chart creation errors have been sought, but may not always have been located. Such creation errors do not reflect on the standard of medical care.

## 2018-02-12 NOTE — Progress Notes (Signed)
[facility] Progress Note     Subjective: CC- SBO Up in chair. States that she passed a little gas this morning. Still feels bloated and also vomited earlier this morning. No BM.  Going down to fluoro to have NG tube replaced today.  Objective: Vital signs in last 24 hours: Temp:  [98.1 F (36.7 C)-98.7 F (37.1 C)] 98.1 F (36.7 C) (06/03 0607) Pulse Rate:  [82-98] 82 (06/03 0607) Resp:  [14-18] 14 (06/03 0607) BP: (130-151)/(70-98) 147/70 (06/03 0607) SpO2:  [93 %-96 %] 95 % (06/03 0607) Weight:  [163 lb (73.9 kg)] 163 lb (73.9 kg) (06/03 0607) Last BM Date: 02/08/18  Intake/Output from previous day: 06/02 0701 - 06/03 0700 In: 1596.7 [I.V.:1496.7; IV Piggyback:100] Out: 600 [Urine:600] Intake/Output this shift: No intake/output data recorded.  PE: Gen:  Alert, NAD, pleasant HEENT: EOM's intact, pupils equal and round Card:  RRR, no M/G/R heard Pulm:  CTAB, no W/R/R, effort normal Abd: Soft, distended, mild lower abdominal tenderness without rebound or guarding, hypoactive BS Ext:  Calves soft and nontender Skin: no rashes noted, warm and dry  Lab Results:  Recent Labs    02/11/18 0402 02/12/18 0417  WBC 12.9* 10.8*  HGB 13.2 12.3  HCT 38.8 36.5  PLT 292 297   BMET Recent Labs    02/11/18 0402 02/12/18 0417  NA 139 137  K 3.1* 3.2*  CL 103 103  CO2 26 25  GLUCOSE 112* 141*  BUN 24* 22*  CREATININE 0.68 0.60  CALCIUM 9.5 9.2   PT/INR No results for input(s): LABPROT, INR in the last 72 hours. CMP     Component Value Date/Time   NA 137 02/12/2018 0417   NA 140 07/30/2015 0950   K 3.2 (L) 02/12/2018 0417   CL 103 02/12/2018 0417   CO2 25 02/12/2018 0417   GLUCOSE 141 (H) 02/12/2018 0417   BUN 22 (H) 02/12/2018 0417   BUN 13 07/30/2015 0950   CREATININE 0.60 02/12/2018 0417   CREATININE 0.66 02/06/2018 0904   CALCIUM 9.2 02/12/2018 0417   PROT 5.9 (L) 02/12/2018 0417   PROT 6.3 07/30/2015 0950   ALBUMIN 2.9 (L) 02/12/2018  0417   ALBUMIN 4.1 07/30/2015 0950   AST 55 (H) 02/12/2018 0417   ALT 41 02/12/2018 0417   ALKPHOS 61 02/12/2018 0417   BILITOT 0.8 02/12/2018 0417   BILITOT 0.5 07/30/2015 0950   GFRNONAA >60 02/12/2018 0417   GFRNONAA 84 02/06/2018 0904   GFRAA >60 02/12/2018 0417   GFRAA 97 02/06/2018 0904   Lipase     Component Value Date/Time   LIPASE 22 02/08/2018 0500       Studies/Results: Dg Abd 2 Views  Result Date: 02/10/2018 CLINICAL DATA:  Vomiting EXAM: ABDOMEN - 2 VIEW COMPARISON:  CT abdomen/pelvis from 02/08/2018 FINDINGS: Multiple dilated small bowel loops with air-fluid levels throughout the abdomen, most prominent in the left abdomen measuring up to 4.3 cm diameter, which appears slightly worsened since the 02/08/2018 CT abdomen scout topogram. Mild colonic stool. No evidence of pneumatosis or pneumoperitoneum. Large hiatal hernia. Stable patchy bibasilar lung opacities. Cholecystectomy clips are seen in the right upper quadrant of the abdomen. IMPRESSION: Multiple dilated small bowel loops with air-fluid levels throughout the abdomen, appearing slightly worsened since 02/08/2018 CT abdomen scout topogram, suggesting worsening distal small bowel obstruction. No evidence of pneumatosis or pneumoperitoneum. Large hiatal hernia with bibasilar lung opacities, favor atelectasis. Electronically Signed   By: Ilona Sorrel M.D.   On:  02/10/2018 19:48   Dg Abd Portable 1v  Result Date: 02/11/2018 CLINICAL DATA:  Repositioning of the indwelling nasogastric tube at the patient's bedside. EXAM: PORTABLE ABDOMEN - 1 VIEW 1:15 p.m.: COMPARISON:  Portable abdomen x-ray earlier today at 12:16 p.m. Abdomen x-ray 02/10/2018. CT abdomen and pelvis 02/08/2018. FINDINGS: The nasogastric tube is looped in the patient's large hiatal hernia, still positioned above the diaphragm. Dilated loops of small bowel in the LEFT UPPER quadrant of the abdomen persist. Atelectasis in the lung bases is again noted.  IMPRESSION: The nasogastric tube is looped in the patient's large hiatal hernia, still positioned in the intrathoracic portion of the stomach. Electronically Signed   By: Evangeline Dakin M.D.   On: 02/11/2018 13:45   Dg Abd Portable 1v-small Bowel Protocol-position Verification  Result Date: 02/11/2018 CLINICAL DATA:  Nasogastric tube placement. EXAM: PORTABLE ABDOMEN - 1 VIEW COMPARISON:  Abdominal plain film dated 02/10/2018. FINDINGS: NG tube tip is at the level of the gastroesophageal junction. Proximal side holes of the tube are at the level of the mid/lower esophagus. Mildly distended gas-filled loops of small bowel are partially imaged within the LEFT upper quadrant, likely stable compared to the earlier exam. IMPRESSION: Nasogastric tube tip is at the level of the gastroesophageal junction. Recommend advancing approximately 10 cm for optimal radiographic positioning Electronically Signed   By: Franki Cabot M.D.   On: 02/11/2018 12:45    Anti-infectives: Anti-infectives (From admission, onward)   Start     Dose/Rate Route Frequency Ordered Stop   02/09/18 1600  piperacillin-tazobactam (ZOSYN) IVPB 3.375 g     3.375 g 12.5 mL/hr over 240 Minutes Intravenous Every 8 hours 02/09/18 1145     02/08/18 1200  Ampicillin-Sulbactam (UNASYN) 3 g in sodium chloride 0.9 % 100 mL IVPB  Status:  Discontinued     3 g 200 mL/hr over 30 Minutes Intravenous Every 6 hours 02/08/18 1127 02/09/18 1130       Assessment/Plan HTN HLD GERD Chronic diastolic dysfunction  Partial SBO with terminal ileal thickening - not improving on abx. Trial steroids for possible Crohns? - Will have fluoro replace NG tube today and start small bowel protocol.  ID - unasyn 5/30>>5/31, zosyn 5/31>> FEN - IVF, NPO/NGT VTE - SCDs, lovenox   LOS: 4 days    Wellington Hampshire , [facility] 02/12/2018, 8:52 AM Pager: [phone] Consults: [phone] Mon 7:00 am -11:30 AM Tues-Fri 7:00 am-4:30  pm Sat-Sun 7:00 am-11:30 am

## 2018-02-13 ENCOUNTER — Inpatient Hospital Stay (HOSPITAL_COMMUNITY): Payer: Medicare Other

## 2018-02-13 LAB — BASIC METABOLIC PANEL
ANION GAP: 9 (ref 5–15)
BUN: 18 mg/dL (ref 6–20)
CALCIUM: 9.4 mg/dL (ref 8.9–10.3)
CO2: 23 mmol/L (ref 22–32)
Chloride: 103 mmol/L (ref 101–111)
Creatinine, Ser: 0.61 mg/dL (ref 0.44–1.00)
GFR calc Af Amer: >60 mL/min (ref >60)
GLUCOSE: 141 mg/dL — AB (ref 65–99)
POTASSIUM: 4.4 mmol/L (ref 3.5–5.1)
Sodium: 135 mmol/L (ref 135–145)

## 2018-02-13 LAB — CBC WITH DIFFERENTIAL/PLATELET
BASOS ABS: 0 10*3/uL (ref 0.0–0.1)
Basophils Relative: 0 %
EOS PCT: 0 %
Eosinophils Absolute: 0 10*3/uL (ref 0.0–0.7)
HEMATOCRIT: 36.1 % (ref 36.0–46.0)
Hemoglobin: 12.5 g/dL (ref 12.0–15.0)
LYMPHS PCT: 6 %
Lymphs Abs: 0.8 10*3/uL (ref 0.7–4.0)
MCH: 30.9 pg (ref 26.0–34.0)
MCHC: 34.6 g/dL (ref 30.0–36.0)
MCV: 89.1 fL (ref 78.0–100.0)
MONO ABS: 0.5 10*3/uL (ref 0.1–1.0)
MONOS PCT: 4 %
NEUTROS ABS: 12.3 10*3/uL — AB (ref 1.7–7.7)
Neutrophils Relative %: 90 %
PLATELETS: 243 10*3/uL (ref 150–400)
RBC: 4.05 MIL/uL (ref 3.87–5.11)
RDW: 13 % (ref 11.5–15.5)
WBC: 13.5 10*3/uL — ABNORMAL HIGH (ref 4.0–10.5)

## 2018-02-13 LAB — MAGNESIUM: MAGNESIUM: 2 mg/dL (ref 1.7–2.4)

## 2018-02-13 NOTE — Progress Notes (Signed)
Subjective: The patient was seen and examined at bedside. She reports 2 small loose liquid stools last night.She reports walking in the hallways frequently and denies abdominal pain.  Objective: Vital signs in last 24 hours: Temp:  [97.5 F (36.4 C)-98.2 F (36.8 C)] 98.2 F (36.8 C) (06/04 0508) Pulse Rate:  [76-94] 86 (06/04 0854) Resp:  [14-16] 16 (06/04 0854) BP: (145-161)/(80-93) 155/84 (06/04 0854) SpO2:  [97 %-98 %] 98 % (06/04 0854) Weight:  [73.2 kg (161 lb 6 oz)] 73.2 kg (161 lb 6 oz) (06/04 0508) Weight change: -0.736 kg (-1 lb 10 oz) Last BM Date: 02/11/18  PE:no pallor, no icterus  GENERAL:not in acute distress ABDOMEN: soft, occasional high-pitched bowel sounds noted, nontender  EXTREMITIES:no deformity  Lab Results: Results for orders placed or performed during the hospital encounter of 02/08/18 (from the past 48 hour(s))  CBC with Differential/Platelet     Status: Abnormal   Collection Time: 02/12/18  4:17 AM  Result Value Ref Range   WBC 10.8 (H) 4.0 - 10.5 K/uL   RBC 3.99 3.87 - 5.11 MIL/uL   Hemoglobin 12.3 12.0 - 15.0 g/dL   HCT 36.5 36.0 - 46.0 %   MCV 91.5 78.0 - 100.0 fL   MCH 30.8 26.0 - 34.0 pg   MCHC 33.7 30.0 - 36.0 g/dL   RDW 12.8 11.5 - 15.5 %   Platelets 297 150 - 400 K/uL   Neutrophils Relative % 92 %   Lymphocytes Relative 6 %   Monocytes Relative 2 %   Eosinophils Relative 0 %   Basophils Relative 0 %   Neutro Abs 10.0 (H) 1.7 - 7.7 K/uL   Lymphs Abs 0.6 (L) 0.7 - 4.0 K/uL   Monocytes Absolute 0.2 0.1 - 1.0 K/uL   Eosinophils Absolute 0.0 0.0 - 0.7 K/uL   Basophils Absolute 0.0 0.0 - 0.1 K/uL   Smear Review MORPHOLOGY UNREMARKABLE     Comment: Performed at [facility], [place] [address], [place], [place] [address]  Basic metabolic panel     Status: Abnormal   Collection Time: 02/12/18  4:17 AM  Result Value Ref Range   Sodium 137 135 - 145 mmol/L   Potassium 3.2 (L) 3.5 - 5.1 mmol/L   Chloride 103 101 - 111 mmol/L    CO2 25 22 - 32 mmol/L   Glucose, Bld 141 (H) 65 - 99 mg/dL   BUN 22 (H) 6 - 20 mg/dL   Creatinine, Ser 0.60 0.44 - 1.00 mg/dL   Calcium 9.2 8.9 - 10.3 mg/dL   GFR calc non Af Amer >60 >60 mL/min   GFR calc Af Amer >60 >60 mL/min    Comment: (NOTE) The eGFR has been calculated using the CKD EPI equation. This calculation has not been validated in all clinical situations. eGFR's persistently <60 mL/min signify possible Chronic Kidney Disease.    Anion gap 9 5 - 15    Comment: Performed at [facility], [place] [address], [place], [place] [address]  Magnesium     Status: None   Collection Time: 02/12/18  4:17 AM  Result Value Ref Range   Magnesium 2.0 1.7 - 2.4 mg/dL    Comment: Performed at [facility], [place] [address] [place], [place] [address]  Hepatic function panel     Status: Abnormal   Collection Time: 02/12/18  4:17 AM  Result Value Ref Range   Total Protein 5.9 (L) 6.5 - 8.1 g/dL   Albumin 2.9 (  L) 3.5 - 5.0 g/dL   AST 55 (H) 15 - 41 U/L   ALT 41 14 - 54 U/L   Alkaline Phosphatase 61 38 - 126 U/L   Total Bilirubin 0.8 0.3 - 1.2 mg/dL   Bilirubin, Direct 0.1 0.1 - 0.5 mg/dL   Indirect Bilirubin 0.7 0.3 - 0.9 mg/dL    Comment: Performed at [facility], [place] [address] [place], [place] [address]  Magnesium     Status: None   Collection Time: 02/13/18  4:19 AM  Result Value Ref Range   Magnesium 2.0 1.7 - 2.4 mg/dL    Comment: Performed at [facility], [place] [address], [place], [place] [address]  Basic metabolic panel     Status: Abnormal   Collection Time: 02/13/18  4:19 AM  Result Value Ref Range   Sodium 135 135 - 145 mmol/L   Potassium 4.4 3.5 - 5.1 mmol/L    Comment: DELTA CHECK NOTED REPEATED TO VERIFY NO VISIBLE HEMOLYSIS    Chloride 103 101 - 111 mmol/L   CO2 23 22 - 32 mmol/L   Glucose, Bld 141 (H) 65 - 99 mg/dL   BUN 18 6 - 20 mg/dL   Creatinine, Ser 0.61 0.44 - 1.00 mg/dL    Calcium 9.4 8.9 - 10.3 mg/dL   GFR calc non Af Amer >60 >60 mL/min   GFR calc Af Amer >60 >60 mL/min    Comment: (NOTE) The eGFR has been calculated using the CKD EPI equation. This calculation has not been validated in all clinical situations. eGFR's persistently <60 mL/min signify possible Chronic Kidney Disease.    Anion gap 9 5 - 15    Comment: Performed at [facility], [place] [address] [place], [place] [address]  CBC with Differential/Platelet     Status: Abnormal   Collection Time: 02/13/18  4:19 AM  Result Value Ref Range   WBC 13.5 (H) 4.0 - 10.5 K/uL   RBC 4.05 3.87 - 5.11 MIL/uL   Hemoglobin 12.5 12.0 - 15.0 g/dL   HCT 36.1 36.0 - 46.0 %   MCV 89.1 78.0 - 100.0 fL   MCH 30.9 26.0 - 34.0 pg   MCHC 34.6 30.0 - 36.0 g/dL   RDW 13.0 11.5 - 15.5 %   Platelets 243 150 - 400 K/uL   Neutrophils Relative % 90 %   Neutro Abs 12.3 (H) 1.7 - 7.7 K/uL   Lymphocytes Relative 6 %   Lymphs Abs 0.8 0.7 - 4.0 K/uL   Monocytes Relative 4 %   Monocytes Absolute 0.5 0.1 - 1.0 K/uL   Eosinophils Relative 0 %   Eosinophils Absolute 0.0 0.0 - 0.7 K/uL   Basophils Relative 0 %   Basophils Absolute 0.0 0.0 - 0.1 K/uL    Comment: Performed at [facility], [place] [address], [place], [place] [address]    Studies/Results: Dg Abd 1 View  Result Date: 02/13/2018 CLINICAL DATA:  Small bowel obstruction EXAM: ABDOMEN - 1 VIEW COMPARISON:  February 12, 2018 FINDINGS: Nasogastric tube tip and side port are in the proximal duodenum. There remain loops of dilated small bowel in a pattern felt to represent persistent obstruction. No free air. There are surgical clips in the gallbladder fossa region. IMPRESSION: Evidence of a degree of bowel obstruction, similar to 1 day prior. Stable nasogastric tube positioning. No evident free air. Electronically Signed   By: Lowella Grip III M.D.   On: 02/13/2018 10:44  Dg Abd Portable 1v-small Bowel Obstruction Protocol-initial, 8  Hr Delay  Result Date: 02/13/2018 CLINICAL DATA:  Small bowel obstruction.  8 hour delayed film. EXAM: PORTABLE ABDOMEN - 1 VIEW COMPARISON:  Abdominal radiograph yesterday.  CT 02/08/2018 FINDINGS: Administered enteric contrast is seen within dilated small bowel in the central abdomen. Enteric contrast has not yet reached the colon. Enteric tube tip in side-port below the diaphragm in the stomach in the right upper abdomen. IMPRESSION: Administered enteric contrast remains within dilated small bowel in the central lower abdomen and has not reached the colon. Recommend 24 hour delayed film. Electronically Signed   By: Jeb Levering M.D.   On: 02/13/2018 00:13   Dg Abd Portable 1v  Result Date: 02/11/2018 CLINICAL DATA:  Repositioning of the indwelling nasogastric tube at the patient's bedside. EXAM: PORTABLE ABDOMEN - 1 VIEW 1:15 p.m.: COMPARISON:  Portable abdomen x-ray earlier today at 12:16 p.m. Abdomen x-ray 02/10/2018. CT abdomen and pelvis 02/08/2018. FINDINGS: The nasogastric tube is looped in the patient's large hiatal hernia, still positioned above the diaphragm. Dilated loops of small bowel in the LEFT UPPER quadrant of the abdomen persist. Atelectasis in the lung bases is again noted. IMPRESSION: The nasogastric tube is looped in the patient's large hiatal hernia, still positioned in the intrathoracic portion of the stomach. Electronically Signed   By: Evangeline Dakin M.D.   On: 02/11/2018 13:45   Dg Abd Portable 1v-small Bowel Protocol-position Verification  Result Date: 02/11/2018 CLINICAL DATA:  Nasogastric tube placement. EXAM: PORTABLE ABDOMEN - 1 VIEW COMPARISON:  Abdominal plain film dated 02/10/2018. FINDINGS: NG tube tip is at the level of the gastroesophageal junction. Proximal side holes of the tube are at the level of the mid/lower esophagus. Mildly distended gas-filled loops of small bowel are partially imaged within the LEFT upper quadrant, likely stable compared to the earlier  exam. IMPRESSION: Nasogastric tube tip is at the level of the gastroesophageal junction. Recommend advancing approximately 10 cm for optimal radiographic positioning Electronically Signed   By: Franki Cabot M.D.   On: 02/11/2018 12:45   Dg Intro Long Gi Tube  Result Date: 02/12/2018 CLINICAL DATA:  NG tube placement for small bowel obstruction. EXAM: INTRO LONG GI TUBE CONTRAST:  30 cc of Isovue-300 FLUOROSCOPY TIME:  Fluoroscopy Time:  2 minutes 45 seconds Radiation Exposure Index (if provided by the fluoroscopic device): 33.18 Number of Acquired Spot Images: 1 COMPARISON:  None. FINDINGS: Under fluoroscopic guidance the nasogastric tube was placed past the hiatal hernia and into the distal stomach. Contrast injection of the nasogastric tube confirms placement. IMPRESSION: Technically successful image guided placement of nasogastric tube with tip in the distal stomach. Electronically Signed   By: Kerby Moors M.D.   On: 02/12/2018 14:41    Medications: I have reviewed the patient's current medications.  Assessment: 1.Small bowel obstruction 2.Mildly elevated lukocytosis  Plan: Abdominal x-ray from today a.m. shows persistent obstruction with dilated loops of small bowel. There is no gas noted in colon.  Patient has been on IV Solu-Medrol 02/11/18, without improvement noted on imaging,total NG output in 24 hours was 2650 yesterday with an additional 500 mL today morning.  Recommend repeat abdominal x-ray in a.m., if the small bowel obstruction is persistent, she will need an exploratory laparotomy.  Crohn's disease unlikely-no prior history, unremarkable prior colonoscopies,not responding to IV steroids.    Ronnette Juniper 02/13/2018, 11:43 AM   Pager [phone] If no answer or after 5 PM call [phone]

## 2018-02-13 NOTE — Progress Notes (Signed)
[facility] Progress Note     Subjective: CC- SBO Patient states that she feels a little better than yesterday, less abdominal pain and bloating. Since NG tube placement she has had 2650cc output. She had 2 loose BMs yesterday, no flatus yet this morning. She has already been up ambulating in the halls today.  Objective: Vital signs in last 24 hours: Temp:  [97.5 F (36.4 C)-98.2 F (36.8 C)] 98.2 F (36.8 C) (06/04 0508) Pulse Rate:  [76-94] 76 (06/04 0508) Resp:  [14-16] 14 (06/04 0508) BP: (145-161)/(80-93) 145/80 (06/04 0508) SpO2:  [97 %-98 %] 97 % (06/04 0508) Weight:  [161 lb 6 oz (73.2 kg)] 161 lb 6 oz (73.2 kg) (06/04 0508) Last BM Date: 02/11/18  Intake/Output from previous day: 06/03 0701 - 06/04 0700 In: 1755 [I.V.:1605; IV Piggyback:150] Out: 2650 [Emesis/NG output:2650] Intake/Output this shift: Total I/O In: -  Out: 350 [Urine:300; Emesis/NG output:50]  PE: Gen:  Alert, NAD, pleasant HEENT: EOM's intact, pupils equal and round Card:  RRR, no M/G/R heard Pulm:  CTAB, no W/R/R, effort normal Abd: Soft, mild distension (less than yesterday), nontender, +BS Ext:  Calves soft and nontender Skin: no rashes noted, warm and dry  Lab Results:  Recent Labs    02/12/18 0417 02/13/18 0419  WBC 10.8* 13.5*  HGB 12.3 12.5  HCT 36.5 36.1  PLT 297 243   BMET Recent Labs    02/12/18 0417 02/13/18 0419  NA 137 135  K 3.2* 4.4  CL 103 103  CO2 25 23  GLUCOSE 141* 141*  BUN 22* 18  CREATININE 0.60 0.61  CALCIUM 9.2 9.4   PT/INR No results for input(s): LABPROT, INR in the last 72 hours. CMP     Component Value Date/Time   NA 135 02/13/2018 0419   NA 140 07/30/2015 0950   K 4.4 02/13/2018 0419   CL 103 02/13/2018 0419   CO2 23 02/13/2018 0419   GLUCOSE 141 (H) 02/13/2018 0419   BUN 18 02/13/2018 0419   BUN 13 07/30/2015 0950   CREATININE 0.61 02/13/2018 0419   CREATININE 0.66 02/06/2018 0904   CALCIUM 9.4 02/13/2018 0419   PROT  5.9 (L) 02/12/2018 0417   PROT 6.3 07/30/2015 0950   ALBUMIN 2.9 (L) 02/12/2018 0417   ALBUMIN 4.1 07/30/2015 0950   AST 55 (H) 02/12/2018 0417   ALT 41 02/12/2018 0417   ALKPHOS 61 02/12/2018 0417   BILITOT 0.8 02/12/2018 0417   BILITOT 0.5 07/30/2015 0950   GFRNONAA >60 02/13/2018 0419   GFRNONAA 84 02/06/2018 0904   GFRAA >60 02/13/2018 0419   GFRAA 97 02/06/2018 0904   Lipase     Component Value Date/Time   LIPASE 22 02/08/2018 0500       Studies/Results: Dg Abd Portable 1v-small Bowel Obstruction Protocol-initial, 8 Hr Delay  Result Date: 02/13/2018 CLINICAL DATA:  Small bowel obstruction.  8 hour delayed film. EXAM: PORTABLE ABDOMEN - 1 VIEW COMPARISON:  Abdominal radiograph yesterday.  CT 02/08/2018 FINDINGS: Administered enteric contrast is seen within dilated small bowel in the central abdomen. Enteric contrast has not yet reached the colon. Enteric tube tip in side-port below the diaphragm in the stomach in the right upper abdomen. IMPRESSION: Administered enteric contrast remains within dilated small bowel in the central lower abdomen and has not reached the colon. Recommend 24 hour delayed film. Electronically Signed   By: Jeb Levering M.D.   On: 02/13/2018 00:13   Dg Abd Portable 1v  Result Date:  02/11/2018 CLINICAL DATA:  Repositioning of the indwelling nasogastric tube at the patient's bedside. EXAM: PORTABLE ABDOMEN - 1 VIEW 1:15 p.m.: COMPARISON:  Portable abdomen x-ray earlier today at 12:16 p.m. Abdomen x-ray 02/10/2018. CT abdomen and pelvis 02/08/2018. FINDINGS: The nasogastric tube is looped in the patient's large hiatal hernia, still positioned above the diaphragm. Dilated loops of small bowel in the LEFT UPPER quadrant of the abdomen persist. Atelectasis in the lung bases is again noted. IMPRESSION: The nasogastric tube is looped in the patient's large hiatal hernia, still positioned in the intrathoracic portion of the stomach. Electronically Signed   By:  Evangeline Dakin M.D.   On: 02/11/2018 13:45   Dg Abd Portable 1v-small Bowel Protocol-position Verification  Result Date: 02/11/2018 CLINICAL DATA:  Nasogastric tube placement. EXAM: PORTABLE ABDOMEN - 1 VIEW COMPARISON:  Abdominal plain film dated 02/10/2018. FINDINGS: NG tube tip is at the level of the gastroesophageal junction. Proximal side holes of the tube are at the level of the mid/lower esophagus. Mildly distended gas-filled loops of small bowel are partially imaged within the LEFT upper quadrant, likely stable compared to the earlier exam. IMPRESSION: Nasogastric tube tip is at the level of the gastroesophageal junction. Recommend advancing approximately 10 cm for optimal radiographic positioning Electronically Signed   By: Franki Cabot M.D.   On: 02/11/2018 12:45   Dg Intro Long Gi Tube  Result Date: 02/12/2018 CLINICAL DATA:  NG tube placement for small bowel obstruction. EXAM: INTRO LONG GI TUBE CONTRAST:  30 cc of Isovue-300 FLUOROSCOPY TIME:  Fluoroscopy Time:  2 minutes 45 seconds Radiation Exposure Index (if provided by the fluoroscopic device): 33.18 Number of Acquired Spot Images: 1 COMPARISON:  None. FINDINGS: Under fluoroscopic guidance the nasogastric tube was placed past the hiatal hernia and into the distal stomach. Contrast injection of the nasogastric tube confirms placement. IMPRESSION: Technically successful image guided placement of nasogastric tube with tip in the distal stomach. Electronically Signed   By: Kerby Moors M.D.   On: 02/12/2018 14:41    Anti-infectives: Anti-infectives (From admission, onward)   Start     Dose/Rate Route Frequency Ordered Stop   02/09/18 1600  piperacillin-tazobactam (ZOSYN) IVPB 3.375 g     3.375 g 12.5 mL/hr over 240 Minutes Intravenous Every 8 hours 02/09/18 1145     02/08/18 1200  Ampicillin-Sulbactam (UNASYN) 3 g in sodium chloride 0.9 % 100 mL IVPB  Status:  Discontinued     3 g 200 mL/hr over 30 Minutes Intravenous Every 6 hours  02/08/18 1127 02/09/18 1130       Assessment/Plan HTN HLD GERD Chronic diastolic dysfunction  Partial SBO with terminal ileal thickening - not improving on abx. Trial steroids for possible Crohns? GI following and plans colonoscopy if SBO improving to eval terminal ileum, vs small bowel follow through if not improving - Xray pending this morning. Continue NPO/NGT to LIWS. Ok to clamp tube briefly to allow patient to ambulate, then return to [place].  ID - unasyn 5/30>>5/31, zosyn 5/31>> FEN - IVF, NPO/NGT VTE - SCDs, lovenox    LOS: 5 days    Wellington Hampshire , [facility] Surgery 02/13/2018, 8:53 AM Pager: [phone] Consults: [phone] Mon 7:00 am -11:30 AM Tues-Fri 7:00 am-4:30 pm Sat-Sun 7:00 am-11:30 am

## 2018-02-13 NOTE — Progress Notes (Signed)
PROGRESS NOTE [facility]   Mechille Varghese   XBD:[id number] DOB: [birth year]  DOA: 02/08/2018 PCP: [name], NP   Brief Narrative:  Brooke Cox is a 77 year old female with medical history significant for hypertension, CHF,  diverticulosis and GERD who presented to the emergency department complaining of severe abdominal pain.  Associated symptoms were nausea and vomiting.  Upon ED evaluation lab work-up was unremarkable.  CT of the abdomen and pelvis showed small bowel wall thickening and inflammatory change in the right lower quadrant with element of partial small bowel obstruction and dilated proximal small bowel loops. ?  Infectious versus inflammatory versus ischemia. A large hiatal hernia.  Patient was admitted with working diagnosis of partial small bowel obstruction secondary to infectious versus inflammatory process.    Subjective: Patient seen and examined, she is doing somewhat better today, less pain. Still passing gas, had loose stool last night. NGT output 2650 cc.   Assessment & Plan: Partial small bowel obstruction in setting of infectious versus inflammatory process. Obstruction not resolving with antibiotic, general surgery and GI have recommended to give a trial of steroids for ? Crohn's disease. Will continue Solu-Medrol and Zosyn for now. Patient completed small bowel protocol still shows evidence of obstruction. Enema was given by gen surg. In no improvement in the next 1-2 days may need ex lap. If open up may need colonoscopy to evaluate terminal ileum. Keep potassium above 4 and magnesium above 2. Repeat Abx xray in AM.   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   Endometrial thickening Incidental finding on CT abdomen and pelvis, recommend ultrasound as an outpatient.  DVT prophylaxis: SCDs Code Status: DNR Family Communication: Family at bedside Disposition Plan: To be determined  Consultants:   GI  General surgery  Procedures:   None  Antimicrobials: Anti-infectives (From admission, onward)   Start     Dose/Rate Route Frequency Ordered Stop   02/09/18 1600  piperacillin-tazobactam (ZOSYN) IVPB 3.375 g     3.375 g 12.5 mL/hr over 240 Minutes Intravenous Every 8 hours 02/09/18 1145     02/08/18 1200  Ampicillin-Sulbactam (UNASYN) 3 g in sodium chloride 0.9 % 100 mL IVPB  Status:  Discontinued     3 g 200 mL/hr over 30 Minutes Intravenous Every 6 hours 02/08/18 1127 02/09/18 1130         Objective: Vitals:   02/12/18 2201 02/13/18 0258 02/13/18 0508 02/13/18 0854  BP: (!) 153/85 (!) 149/80 (!) 145/80 (!) 155/84  Pulse: 77 94 76 86  Resp: 14  14 16   Temp: 98 F (36.7 C)  98.2 F (36.8 C)   TempSrc: Oral  Oral   SpO2: 98%  97% 98%  Weight:   73.2 kg (161 lb 6 oz)   Height:        Intake/Output Summary (Last 24 hours) at 02/13/2018 1116 Last data filed at 02/13/2018 1048 Gross per 24 hour  Intake 2305 ml  Output 3650 ml  Net -1345 ml   Filed Weights   02/11/18 0638 02/12/18 0607 02/13/18 0508  Weight: 72.6 kg (160 lb 0.9 oz) 73.9 kg (163 lb) 73.2 kg (161 lb 6 oz)    Examination:  General:  Pt is alert, awake, not in acute distress, NGT in place  Cardiovascular: RRR, S1/S2 +, no rubs, no gallops Respiratory: CTA bilaterally, no wheezing, no rhonchi Abdominal: Soft, NT, ND, bowel sounds + Extremities: no edema.   Data Reviewed: I have personally reviewed following labs and imaging studies  CBC: Recent Labs  Lab 02/08/18 0500 02/09/18 0519 02/10/18 0451 02/11/18 0402 02/12/18 0417 02/13/18 0419  WBC 10.3 14.8* 14.2* 12.9* 10.8* 13.5*  NEUTROABS 8.6*   --  12.2*  --  10.0* 12.3*  HGB 11.7* 12.8 12.4 13.2 12.3 12.5  HCT 35.0* 38.8 37.9 38.8 36.5 36.1  MCV 92.1 93.7 93.1 91.3 91.5 89.1  PLT 271 293 287 292 297 562   Basic Metabolic Panel: Recent Labs  Lab 02/09/18 0519 02/10/18 0451 02/11/18 0402 02/12/18 0417 02/13/18 0419  NA 141 139 139 137 135  K 3.7 3.4* 3.1* 3.2* 4.4  CL 106 104 103 103 103  CO2 28 26 26 25 23   GLUCOSE 130* 115* 112* 141* 141*  BUN 15 19 24* 22* 18  CREATININE 0.58 0.70 0.68 0.60 0.61  CALCIUM 9.5 9.8 9.5 9.2 9.4  MG  --  2.2 2.0 2.0 2.0   GFR: Estimated Creatinine Clearance: 49.7 mL/min (by C-G formula based on SCr of 0.61 mg/dL). Liver Function Tests: Recent Labs  Lab 02/08/18 0500 02/12/18 0417  AST 16 55*  ALT 13* 41  ALKPHOS 59 61  BILITOT 0.5 0.8  PROT 6.6 5.9*  ALBUMIN 3.8 2.9*   Recent Labs  Lab 02/08/18 0500  LIPASE 22   No results for input(s): AMMONIA in the last 168 hours. Coagulation Profile: No results for input(s): INR, PROTIME in the last 168 hours. Cardiac Enzymes: No results for input(s): CKTOTAL, CKMB, CKMBINDEX, TROPONINI in the last 168 hours. BNP (last 3 results) No results for input(s): PROBNP in the last 8760 hours. HbA1C: No results for input(s): HGBA1C in the last 72 hours. CBG: No results for input(s): GLUCAP in the last 168 hours. Lipid Profile: No results for input(s): CHOL, HDL, LDLCALC, TRIG, CHOLHDL, LDLDIRECT in the last 72 hours. Thyroid Function Tests: No results for input(s): TSH, T4TOTAL, FREET4, T3FREE, THYROIDAB in the last 72 hours. Anemia Panel: No results for input(s): VITAMINB12, FOLATE, FERRITIN, TIBC, IRON, RETICCTPCT in the last 72 hours. Sepsis Labs: Recent Labs  Lab 02/08/18 0536  LATICACIDVEN 2.06*    No results found for this or any previous visit (from the past 240 hour(s)).    Radiology Studies: Dg Abd 1 View  Result Date: 02/13/2018 CLINICAL DATA:  Small bowel obstruction EXAM: ABDOMEN - 1 VIEW COMPARISON:  February 12, 2018  FINDINGS: Nasogastric tube tip and side port are in the proximal duodenum. There remain loops of dilated small bowel in a pattern felt to represent persistent obstruction. No free air. There are surgical clips in the gallbladder fossa region. IMPRESSION: Evidence of a degree of bowel obstruction, similar to 1 day prior. Stable nasogastric tube positioning. No evident free air. Electronically Signed   By: Lowella Grip III M.D.   On: 02/13/2018 10:44   Dg Abd Portable 1v-small Bowel Obstruction Protocol-initial, 8 Hr Delay  Result Date: 02/13/2018 CLINICAL DATA:  Small bowel obstruction.  8 hour delayed film. EXAM: PORTABLE ABDOMEN - 1 VIEW COMPARISON:  Abdominal radiograph yesterday.  CT 02/08/2018 FINDINGS: Administered enteric contrast is seen within dilated small bowel in the central abdomen. Enteric contrast has not yet reached the colon. Enteric tube tip in side-port below the diaphragm in  the stomach in the right upper abdomen. IMPRESSION: Administered enteric contrast remains within dilated small bowel in the central lower abdomen and has not reached the colon. Recommend 24 hour delayed film. Electronically Signed   By: Jeb Levering M.D.   On: 02/13/2018 00:13   Dg Abd Portable 1v  Result Date: 02/11/2018 CLINICAL DATA:  Repositioning of the indwelling nasogastric tube at the patient's bedside. EXAM: PORTABLE ABDOMEN - 1 VIEW 1:15 p.m.: COMPARISON:  Portable abdomen x-ray earlier today at 12:16 p.m. Abdomen x-ray 02/10/2018. CT abdomen and pelvis 02/08/2018. FINDINGS: The nasogastric tube is looped in the patient's large hiatal hernia, still positioned above the diaphragm. Dilated loops of small bowel in the LEFT UPPER quadrant of the abdomen persist. Atelectasis in the lung bases is again noted. IMPRESSION: The nasogastric tube is looped in the patient's large hiatal hernia, still positioned in the intrathoracic portion of the stomach. Electronically Signed   By: Evangeline Dakin M.D.   On:  02/11/2018 13:45   Dg Abd Portable 1v-small Bowel Protocol-position Verification  Result Date: 02/11/2018 CLINICAL DATA:  Nasogastric tube placement. EXAM: PORTABLE ABDOMEN - 1 VIEW COMPARISON:  Abdominal plain film dated 02/10/2018. FINDINGS: NG tube tip is at the level of the gastroesophageal junction. Proximal side holes of the tube are at the level of the mid/lower esophagus. Mildly distended gas-filled loops of small bowel are partially imaged within the LEFT upper quadrant, likely stable compared to the earlier exam. IMPRESSION: Nasogastric tube tip is at the level of the gastroesophageal junction. Recommend advancing approximately 10 cm for optimal radiographic positioning Electronically Signed   By: Franki Cabot M.D.   On: 02/11/2018 12:45   Dg Intro Long Gi Tube  Result Date: 02/12/2018 CLINICAL DATA:  NG tube placement for small bowel obstruction. EXAM: INTRO LONG GI TUBE CONTRAST:  30 cc of Isovue-300 FLUOROSCOPY TIME:  Fluoroscopy Time:  2 minutes 45 seconds Radiation Exposure Index (if provided by the fluoroscopic device): 33.18 Number of Acquired Spot Images: 1 COMPARISON:  None. FINDINGS: Under fluoroscopic guidance the nasogastric tube was placed past the hiatal hernia and into the distal stomach. Contrast injection of the nasogastric tube confirms placement. IMPRESSION: Technically successful image guided placement of nasogastric tube with tip in the distal stomach. Electronically Signed   By: Kerby Moors M.D.   On: 02/12/2018 14:41    Scheduled Meds: . bisacodyl  10 mg Rectal Daily  . enoxaparin (LOVENOX) injection  40 mg Subcutaneous Q24H  . lip balm  1 application Topical BID  . methylPREDNISolone (SOLU-MEDROL) injection  40 mg Intravenous Q12H  . metoprolol tartrate  10 mg Intravenous Q6H   Continuous Infusions: . dextrose 5 % and 0.45 % NaCl with KCl 40 mEq/L 100 mL/hr at 02/13/18 0108  . methocarbamol (ROBAXIN)  IV    . ondansetron (ZOFRAN) IV    .  piperacillin-tazobactam (ZOSYN)  IV 3.375 g (02/13/18 0739)     LOS: 5 days   Time spent: Total of 25 minutes spent with pt, greater than 50% of which was spent in discussion of  treatment, counseling and coordination of care  Chipper Oman, MD Pager: Text Page via [email or web address]   If 7PM-7AM, please contact night-coverage [email or web address] 02/13/2018, 11:16 AM   Note - This record has been created using [employer]. Chart creation errors have been sought, but may not always have been located. Such creation errors do not reflect on the standard of medical care.

## 2018-02-14 ENCOUNTER — Inpatient Hospital Stay (HOSPITAL_COMMUNITY): Payer: Medicare Other

## 2018-02-14 ENCOUNTER — Encounter (HOSPITAL_COMMUNITY): Payer: Self-pay | Admitting: Radiology

## 2018-02-14 DIAGNOSIS — K50019 Crohn's disease of small intestine with unspecified complications: Secondary | ICD-10-CM

## 2018-02-14 DIAGNOSIS — K56600 Partial intestinal obstruction, unspecified as to cause: Secondary | ICD-10-CM

## 2018-02-14 DIAGNOSIS — I5032 Chronic diastolic (congestive) heart failure: Secondary | ICD-10-CM

## 2018-02-14 DIAGNOSIS — D72829 Elevated white blood cell count, unspecified: Secondary | ICD-10-CM

## 2018-02-14 DIAGNOSIS — I10 Essential (primary) hypertension: Secondary | ICD-10-CM

## 2018-02-14 LAB — BASIC METABOLIC PANEL
ANION GAP: 7 (ref 5–15)
BUN: 20 mg/dL (ref 6–20)
CALCIUM: 9.7 mg/dL (ref 8.9–10.3)
CHLORIDE: 99 mmol/L — AB (ref 101–111)
CO2: 28 mmol/L (ref 22–32)
Creatinine, Ser: 0.66 mg/dL (ref 0.44–1.00)
GFR calc Af Amer: >60 mL/min (ref >60)
GFR calc non Af Amer: >60 mL/min (ref >60)
GLUCOSE: 121 mg/dL — AB (ref 65–99)
POTASSIUM: 4.4 mmol/L (ref 3.5–5.1)
Sodium: 134 mmol/L — ABNORMAL LOW (ref 135–145)

## 2018-02-14 LAB — CBC WITH DIFFERENTIAL/PLATELET
Basophils Absolute: 0 10*3/uL (ref 0.0–0.1)
Basophils Relative: 0 %
Eosinophils Absolute: 0 10*3/uL (ref 0.0–0.7)
Eosinophils Relative: 0 %
HEMATOCRIT: 36.4 % (ref 36.0–46.0)
HEMOGLOBIN: 12.4 g/dL (ref 12.0–15.0)
LYMPHS ABS: 0.7 10*3/uL (ref 0.7–4.0)
LYMPHS PCT: 5 %
MCH: 30.9 pg (ref 26.0–34.0)
MCHC: 34.1 g/dL (ref 30.0–36.0)
MCV: 90.8 fL (ref 78.0–100.0)
MONO ABS: 0.5 10*3/uL (ref 0.1–1.0)
MONOS PCT: 3 %
NEUTROS ABS: 14.5 10*3/uL — AB (ref 1.7–7.7)
Neutrophils Relative %: 92 %
Platelets: 320 10*3/uL (ref 150–400)
RBC: 4.01 MIL/uL (ref 3.87–5.11)
RDW: 12.9 % (ref 11.5–15.5)
WBC: 15.8 10*3/uL — ABNORMAL HIGH (ref 4.0–10.5)

## 2018-02-14 LAB — SURGICAL PCR SCREEN
MRSA, PCR: NEGATIVE
Staphylococcus aureus: NEGATIVE

## 2018-02-14 LAB — MAGNESIUM: Magnesium: 2.2 mg/dL (ref 1.7–2.4)

## 2018-02-14 MED ORDER — IOPAMIDOL (ISOVUE-300) INJECTION 61%
INTRAVENOUS | Status: AC
  Filled 2018-02-14: qty 100

## 2018-02-14 MED ORDER — IOPAMIDOL (ISOVUE-300) INJECTION 61%
100.0000 mL | Freq: Once | INTRAVENOUS | Status: AC | PRN
  Administered 2018-02-14: 100 mL via INTRAVENOUS

## 2018-02-14 MED ORDER — IOPAMIDOL (ISOVUE-300) INJECTION 61%: 15.0000 mL | Freq: Two times a day (BID) | INTRAVENOUS | Status: DC | PRN

## 2018-02-14 MED ORDER — IOPAMIDOL (ISOVUE-300) INJECTION 61%
INTRAVENOUS | Status: AC
  Filled 2018-02-14: qty 30

## 2018-02-14 NOTE — Progress Notes (Signed)
[facility] Progress Note     Subjective: CC- SBO Patient states that she feels the same as yesterday. She reports mild abdominal bloating. Small loose BM yesterday, no flatus.  NG tube with 2100cc output over the last 24 hours.  Objective: Vital signs in last 24 hours: Temp:  [97.5 F (36.4 C)-98.6 F (37 C)] 98.2 F (36.8 C) (06/05 0517) Pulse Rate:  [73-99] 73 (06/05 0517) Resp:  [16-18] 16 (06/05 0517) BP: (150-171)/(83-90) 150/83 (06/05 0517) SpO2:  [97 %-100 %] 98 % (06/05 0517) Weight:  [155 lb 13.8 oz (70.7 kg)] 155 lb 13.8 oz (70.7 kg) (06/05 0749) Last BM Date: 02/13/18  Intake/Output from previous day: 06/04 0701 - 06/05 0700 In: 1848.3 [P.O.:180; I.V.:1518.3; IV Piggyback:150] Out: 3600 [Urine:1500; Emesis/NG output:2100] Intake/Output this shift: No intake/output data recorded.  PE: Gen: Alert, NAD, pleasant HEENT: EOM's intact, pupils equal and round Card: RRR, no M/G/R heard Pulm: CTAB, no W/R/R, effort normal Abd: Soft,mild distension, nontender,+BS IZT:IWPYKD soft and nontender Skin: no rashes noted, warm and dry  Lab Results:  Recent Labs    02/13/18 0419 02/14/18 0441  WBC 13.5* 15.8*  HGB 12.5 12.4  HCT 36.1 36.4  PLT 243 320   BMET Recent Labs    02/13/18 0419 02/14/18 0441  NA 135 134*  K 4.4 4.4  CL 103 99*  CO2 23 28  GLUCOSE 141* 121*  BUN 18 20  CREATININE 0.61 0.66  CALCIUM 9.4 9.7   PT/INR No results for input(s): LABPROT, INR in the last 72 hours. CMP     Component Value Date/Time   NA 134 (L) 02/14/2018 0441   NA 140 07/30/2015 0950   K 4.4 02/14/2018 0441   CL 99 (L) 02/14/2018 0441   CO2 28 02/14/2018 0441   GLUCOSE 121 (H) 02/14/2018 0441   BUN 20 02/14/2018 0441   BUN 13 07/30/2015 0950   CREATININE 0.66 02/14/2018 0441   CREATININE 0.66 02/06/2018 0904   CALCIUM 9.7 02/14/2018 0441   PROT 5.9 (L) 02/12/2018 0417   PROT 6.3 07/30/2015 0950   ALBUMIN 2.9 (L) 02/12/2018 0417   ALBUMIN 4.1 07/30/2015 0950   AST 55 (H) 02/12/2018 0417   ALT 41 02/12/2018 0417   ALKPHOS 61 02/12/2018 0417   BILITOT 0.8 02/12/2018 0417   BILITOT 0.5 07/30/2015 0950   GFRNONAA >60 02/14/2018 0441   GFRNONAA 84 02/06/2018 0904   GFRAA >60 02/14/2018 0441   GFRAA 97 02/06/2018 0904   Lipase     Component Value Date/Time   LIPASE 22 02/08/2018 0500       Studies/Results: Dg Abd 1 View  Result Date: 02/14/2018 CLINICAL DATA:  Bowel obstruction EXAM: ABDOMEN - 1 VIEW COMPARISON:  February 13, 2018 FINDINGS: Nasogastric tube tip and side port are in the duodenum, stable. There remain loops of dilated bowel, slightly less than on 1 day prior. No free air. Surgical clips noted in right upper quadrant. Lung bases are clear. IMPRESSION: Nasogastric tube position unchanged. Less bowel dilatation compared to 1 day prior. No air-fluid levels evident. No free air. Lung bases clear. Electronically Signed   By: Lowella Grip III M.D.   On: 02/14/2018 07:34   Dg Abd 1 View  Result Date: 02/13/2018 CLINICAL DATA:  Small bowel obstruction EXAM: ABDOMEN - 1 VIEW COMPARISON:  February 12, 2018 FINDINGS: Nasogastric tube tip and side port are in the proximal duodenum. There remain loops of dilated small bowel in a pattern felt to represent persistent  obstruction. No free air. There are surgical clips in the gallbladder fossa region. IMPRESSION: Evidence of a degree of bowel obstruction, similar to 1 day prior. Stable nasogastric tube positioning. No evident free air. Electronically Signed   By: Lowella Grip III M.D.   On: 02/13/2018 10:44   Dg Abd Portable 1v-small Bowel Obstruction Protocol-initial, 8 Hr Delay  Result Date: 02/13/2018 CLINICAL DATA:  Small bowel obstruction.  8 hour delayed film. EXAM: PORTABLE ABDOMEN - 1 VIEW COMPARISON:  Abdominal radiograph yesterday.  CT 02/08/2018 FINDINGS: Administered enteric contrast is seen within dilated small bowel in the central abdomen. Enteric contrast has  not yet reached the colon. Enteric tube tip in side-port below the diaphragm in the stomach in the right upper abdomen. IMPRESSION: Administered enteric contrast remains within dilated small bowel in the central lower abdomen and has not reached the colon. Recommend 24 hour delayed film. Electronically Signed   By: Jeb Levering M.D.   On: 02/13/2018 00:13   Dg Intro Long Gi Tube  Result Date: 02/12/2018 CLINICAL DATA:  NG tube placement for small bowel obstruction. EXAM: INTRO LONG GI TUBE CONTRAST:  30 cc of Isovue-300 FLUOROSCOPY TIME:  Fluoroscopy Time:  2 minutes 45 seconds Radiation Exposure Index (if provided by the fluoroscopic device): 33.18 Number of Acquired Spot Images: 1 COMPARISON:  None. FINDINGS: Under fluoroscopic guidance the nasogastric tube was placed past the hiatal hernia and into the distal stomach. Contrast injection of the nasogastric tube confirms placement. IMPRESSION: Technically successful image guided placement of nasogastric tube with tip in the distal stomach. Electronically Signed   By: Kerby Moors M.D.   On: 02/12/2018 14:41    Anti-infectives: Anti-infectives (From admission, onward)   Start     Dose/Rate Route Frequency Ordered Stop   02/09/18 1600  piperacillin-tazobactam (ZOSYN) IVPB 3.375 g     3.375 g 12.5 mL/hr over 240 Minutes Intravenous Every 8 hours 02/09/18 1145     02/08/18 1200  Ampicillin-Sulbactam (UNASYN) 3 g in sodium chloride 0.9 % 100 mL IVPB  Status:  Discontinued     3 g 200 mL/hr over 30 Minutes Intravenous Every 6 hours 02/08/18 1127 02/09/18 1130       Assessment/Plan HTN HLD GERD Chronic diastolic dysfunction  Partial SBO with terminal ileal thickening -not improving on abx. Trial steroids for possible Crohns? GI following and plans colonoscopy if SBO improving to eval terminal ileum, vs small bowel follow through if not improving  ID -unasyn 5/30>>5/31, zosyn 5/31>> FEN -IVF, NPO/NGT VTE -SCDs, lovenox Foley -  none  Plan - Xray shows slightly less small bowel dilation today, but patient continues to have high NG tube output. Small BM yesterday, no flatus. Continue NPO/NG tube. Clinically she is not opening up; discussed with MD, will repeat CT scan today.   LOS: 6 days    Wellington [place] , [facility] 02/14/2018, 8:22 AM Pager: [phone] Consults: [phone] Mon 7:00 am -11:30 AM Tues-Fri 7:00 am-4:30 pm Sat-Sun 7:00 am-11:30 am

## 2018-02-14 NOTE — Progress Notes (Signed)
Patient ID: Brooke Cox, female   DOB: 1941-01-18, 77 y.o.   MRN: [id number]  PROGRESS NOTE    Brooke Cox  VOZ:[id number] DOB: [birth year] DOA: 02/08/2018 PCP: [name], NP   Brief Narrative: 77 year old female with history of hypertension, CHF, diverticulosis and GERD presented on 02/08/2018 with complaints of severe abdominal pain with nausea and vomiting.  CT of the abdomen and pelvis showed small bowel thickening and inflammatory change in the right lower quadrant and element of partial small bowel obstruction and dilated proximal small bowel loops, ? infectious versus inflammatory versus ischemia.  General surgery and GI were consulted.   Assessment & Plan:   Principal Problem:   Ileitis, terminal ([place]) Active Problems:   Benign hypertensive heart disease without heart failure   Hyperlipidemia with target LDL less than 130   Sigmoid diverticulosis   Obesity (BMI 30.0-34.9)   Partial small bowel obstruction (HCC)   Partial small bowel obstruction in the setting of infectious versus inflammatory process -General surgery and GI following.  Still has NG tube.  Passing gas.  Had a small bowel movement yesterday. -Currently on empiric trial of antibiotics/Zosyn.  She was also empirically treated with Solu-Medrol for 3 days for question of inflammatory bowel disease and had no response.  Leukocytosis -Worsening.  Probably reactive from steroid use  Chronic diastolic dysfunction -No signs of fluid overload -Strict input output.  Daily weights  Essential hypertension -Blood pressure on the higher side.  Continue IV metoprolol while n.p.o.  Hepatic steatosis -Incidental finding on CT abdomen/pelvis, also 2 lesions likely consistent with hemangioma, recommend six-month follow-up with MRI as outpatient  Endometrial thickening -Incidental finding on CT abdomen and pelvis, recommend outpatient ultrasound.    DVT prophylaxis: Lovenox Code Status: DNR Family  Communication: None at bedside Disposition Plan: Depends on clinical outcome  Consultants: General surgery/GI  Procedures: None  Antimicrobials: Unasyn on 01/22/2018-02/09/2018   Zosyn from 02/09/2018 onwards   Subjective: Patient seen and examined at bedside.  She denies current abdominal pain or nausea.  No overnight vomiting or fever.  Had a bowel movement yesterday, passing gas.  Objective: Vitals:   02/13/18 2111 02/14/18 0429 02/14/18 0517 02/14/18 0749  BP: (!) 152/86 (!) 162/90 (!) 150/83   Pulse: 99 87 73   Resp: 18 18 16    Temp: (!) 97.5 F (36.4 C) 98.6 F (37 C) 98.2 F (36.8 C)   TempSrc: Oral Oral Oral   SpO2: 98% 97% 98%   Weight:    70.7 kg (155 lb 13.8 oz)  Height:        Intake/Output Summary (Last 24 hours) at 02/14/2018 0815 Last data filed at 02/14/2018 0659 Gross per 24 hour  Intake 1848.33 ml  Output 3250 ml  Net -1401.67 ml   Filed Weights   02/12/18 0607 02/13/18 0508 02/14/18 0749  Weight: 73.9 kg (163 lb) 73.2 kg (161 lb 6 oz) 70.7 kg (155 lb 13.8 oz)    Examination:  General exam: Appears calm and comfortable.  NG tube in place Respiratory system: Bilateral decreased breath sound at bases Cardiovascular system: S1 & S2 heard, rate controlled  gastrointestinal system: Abdomen is nondistended, soft and nontender.  Bowel sounds sluggish. Extremities: No cyanosis; trace edema      Data Reviewed: I have personally reviewed following labs and imaging studies  CBC: Recent Labs  Lab 02/08/18 0500  02/10/18 0451 02/11/18 0402 02/12/18 0417 02/13/18 0419 02/14/18 0441  WBC 10.3   < > 14.2* 12.9* 10.8* 13.5* 15.8*  NEUTROABS 8.6*  --  12.2*  --  10.0* 12.3* 14.5*  HGB 11.7*   < > 12.4 13.2 12.3 12.5 12.4  HCT 35.0*   < > 37.9 38.8 36.5 36.1 36.4  MCV 92.1   < > 93.1 91.3 91.5 89.1 90.8  PLT 271   < > 287 292 297 243 320   < > = values in this interval not displayed.   Basic Metabolic Panel: Recent Labs  Lab 02/10/18 0451  02/11/18 0402 02/12/18 0417 02/13/18 0419 02/14/18 0441  NA 139 139 137 135 134*  K 3.4* 3.1* 3.2* 4.4 4.4  CL 104 103 103 103 99*  CO2 26 26 25 23 28   GLUCOSE 115* 112* 141* 141* 121*  BUN 19 24* 22* 18 20  CREATININE 0.70 0.68 0.60 0.61 0.66  CALCIUM 9.8 9.5 9.2 9.4 9.7  MG 2.2 2.0 2.0 2.0 2.2   GFR: Estimated Creatinine Clearance: 48.8 mL/min (by C-G formula based on SCr of 0.66 mg/dL). Liver Function Tests: Recent Labs  Lab 02/08/18 0500 02/12/18 0417  AST 16 55*  ALT 13* 41  ALKPHOS 59 61  BILITOT 0.5 0.8  PROT 6.6 5.9*  ALBUMIN 3.8 2.9*   Recent Labs  Lab 02/08/18 0500  LIPASE 22   No results for input(s): AMMONIA in the last 168 hours. Coagulation Profile: No results for input(s): INR, PROTIME in the last 168 hours. Cardiac Enzymes: No results for input(s): CKTOTAL, CKMB, CKMBINDEX, TROPONINI in the last 168 hours. BNP (last 3 results) No results for input(s): PROBNP in the last 8760 hours. HbA1C: No results for input(s): HGBA1C in the last 72 hours. CBG: No results for input(s): GLUCAP in the last 168 hours. Lipid Profile: No results for input(s): CHOL, HDL, LDLCALC, TRIG, CHOLHDL, LDLDIRECT in the last 72 hours. Thyroid Function Tests: No results for input(s): TSH, T4TOTAL, FREET4, T3FREE, THYROIDAB in the last 72 hours. Anemia Panel: No results for input(s): VITAMINB12, FOLATE, FERRITIN, TIBC, IRON, RETICCTPCT in the last 72 hours. Sepsis Labs: Recent Labs  Lab 02/08/18 0536  LATICACIDVEN 2.06*    No results found for this or any previous visit (from the past 240 hour(s)).       Radiology Studies: Dg Abd 1 View  Result Date: 02/14/2018 CLINICAL DATA:  Bowel obstruction EXAM: ABDOMEN - 1 VIEW COMPARISON:  February 13, 2018 FINDINGS: Nasogastric tube tip and side port are in the duodenum, stable. There remain loops of dilated bowel, slightly less than on 1 day prior. No free air. Surgical clips noted in right upper quadrant. Lung bases are clear.  IMPRESSION: Nasogastric tube position unchanged. Less bowel dilatation compared to 1 day prior. No air-fluid levels evident. No free air. Lung bases clear. Electronically Signed   By: Lowella Grip III M.D.   On: 02/14/2018 07:34   Dg Abd 1 View  Result Date: 02/13/2018 CLINICAL DATA:  Small bowel obstruction EXAM: ABDOMEN - 1 VIEW COMPARISON:  February 12, 2018 FINDINGS: Nasogastric tube tip and side port are in the proximal duodenum. There remain loops of dilated small bowel in a pattern felt to represent persistent obstruction. No free air. There are surgical clips in the gallbladder fossa region. IMPRESSION: Evidence of a degree of bowel obstruction, similar to 1 day prior. Stable nasogastric tube positioning. No evident free air. Electronically Signed   By: Lowella Grip III M.D.   On: 02/13/2018 10:44   Dg Abd Portable 1v-small Bowel Obstruction Protocol-initial, 8 Hr Delay  Result Date: 02/13/2018 CLINICAL DATA:  Small bowel obstruction.  8 hour delayed film. EXAM: PORTABLE ABDOMEN - 1 VIEW COMPARISON:  Abdominal radiograph yesterday.  CT 02/08/2018 FINDINGS: Administered enteric contrast is seen within dilated small bowel in the central abdomen. Enteric contrast has not yet reached the colon. Enteric tube tip in side-port below the diaphragm in the stomach in the right upper abdomen. IMPRESSION: Administered enteric contrast remains within dilated small bowel in the central lower abdomen and has not reached the colon. Recommend 24 hour delayed film. Electronically Signed   By: Jeb Levering M.D.   On: 02/13/2018 00:13   Dg Intro Long Gi Tube  Result Date: 02/12/2018 CLINICAL DATA:  NG tube placement for small bowel obstruction. EXAM: INTRO LONG GI TUBE CONTRAST:  30 cc of Isovue-300 FLUOROSCOPY TIME:  Fluoroscopy Time:  2 minutes 45 seconds Radiation Exposure Index (if provided by the fluoroscopic device): 33.18 Number of Acquired Spot Images: 1 COMPARISON:  None. FINDINGS: Under fluoroscopic  guidance the nasogastric tube was placed past the hiatal hernia and into the distal stomach. Contrast injection of the nasogastric tube confirms placement. IMPRESSION: Technically successful image guided placement of nasogastric tube with tip in the distal stomach. Electronically Signed   By: Kerby Moors M.D.   On: 02/12/2018 14:41        Scheduled Meds: . bisacodyl  10 mg Rectal Daily  . enoxaparin (LOVENOX) injection  40 mg Subcutaneous Q24H  . lip balm  1 application Topical BID  . metoprolol tartrate  10 mg Intravenous Q6H   Continuous Infusions: . dextrose 5 % and 0.45 % NaCl with KCl 40 mEq/L 50 mL/hr at 02/13/18 1222  . methocarbamol (ROBAXIN)  IV    . ondansetron (ZOFRAN) IV    . piperacillin-tazobactam (ZOSYN)  IV 3.375 g (02/14/18 0741)     LOS: 6 days        Aline August, MD [facility] Pager [phone]  If 7PM-7AM, please contact night-coverage [email or web address] Password [email or web address] 02/14/2018, 8:15 AM

## 2018-02-14 NOTE — Progress Notes (Signed)
Subjective: The patient was seen and examined at bedside. She reports having a small bowel movement yesterday and 1 bowel movement today. But continues to have significant NG output, denies abdominal pain.  Objective: Vital signs in last 24 hours: Temp:  [97.5 F (36.4 C)-98.6 F (37 C)] 98.2 F (36.8 C) (06/05 0517) Pulse Rate:  [73-99] 73 (06/05 0830) Resp:  [16-18] 16 (06/05 0830) BP: (148-171)/(83-90) 148/83 (06/05 0830) SpO2:  [97 %-100 %] 98 % (06/05 0830) Weight:  [70.7 kg (155 lb 13.8 oz)] 70.7 kg (155 lb 13.8 oz) (06/05 0749) Weight change:  Last BM Date: 02/13/18  PE:NG tube with almost 1 L of bilious output in canister GENERAL:Not in distress ABDOMEN:soft, nondistended, nontender, bowel sounds not audible EXTREMITIES:no deformity  Lab Results: Results for orders placed or performed during the hospital encounter of 02/08/18 (from the past 48 hour(s))  Magnesium     Status: None   Collection Time: 02/13/18  4:19 AM  Result Value Ref Range   Magnesium 2.0 1.7 - 2.4 mg/dL    Comment: Performed at [facility], [place] [address] [place], [place] [address]  Basic metabolic panel     Status: Abnormal   Collection Time: 02/13/18  4:19 AM  Result Value Ref Range   Sodium 135 135 - 145 mmol/L   Potassium 4.4 3.5 - 5.1 mmol/L    Comment: DELTA CHECK NOTED REPEATED TO VERIFY NO VISIBLE HEMOLYSIS    Chloride 103 101 - 111 mmol/L   CO2 23 22 - 32 mmol/L   Glucose, Bld 141 (H) 65 - 99 mg/dL   BUN 18 6 - 20 mg/dL   Creatinine, Ser 0.61 0.44 - 1.00 mg/dL   Calcium 9.4 8.9 - 10.3 mg/dL   GFR calc non Af Amer >60 >60 mL/min   GFR calc Af Amer >60 >60 mL/min    Comment: (NOTE) The eGFR has been calculated using the CKD EPI equation. This calculation has not been validated in all clinical situations. eGFR's persistently <60 mL/min signify possible Chronic Kidney Disease.    Anion gap 9 5 - 15    Comment: Performed at [facility], [place]  [address], [place], [place] [address]  CBC with Differential/Platelet     Status: Abnormal   Collection Time: 02/13/18  4:19 AM  Result Value Ref Range   WBC 13.5 (H) 4.0 - 10.5 K/uL   RBC 4.05 3.87 - 5.11 MIL/uL   Hemoglobin 12.5 12.0 - 15.0 g/dL   HCT 36.1 36.0 - 46.0 %   MCV 89.1 78.0 - 100.0 fL   MCH 30.9 26.0 - 34.0 pg   MCHC 34.6 30.0 - 36.0 g/dL   RDW 13.0 11.5 - 15.5 %   Platelets 243 150 - 400 K/uL   Neutrophils Relative % 90 %   Neutro Abs 12.3 (H) 1.7 - 7.7 K/uL   Lymphocytes Relative 6 %   Lymphs Abs 0.8 0.7 - 4.0 K/uL   Monocytes Relative 4 %   Monocytes Absolute 0.5 0.1 - 1.0 K/uL   Eosinophils Relative 0 %   Eosinophils Absolute 0.0 0.0 - 0.7 K/uL   Basophils Relative 0 %   Basophils Absolute 0.0 0.0 - 0.1 K/uL    Comment: Performed at [facility], [place] [address], [place], [place] [address]  Basic metabolic panel     Status: Abnormal   Collection Time: 02/14/18  4:41 AM  Result Value Ref Range   Sodium 134 (L) 135 - 145  mmol/L   Potassium 4.4 3.5 - 5.1 mmol/L   Chloride 99 (L) 101 - 111 mmol/L   CO2 28 22 - 32 mmol/L   Glucose, Bld 121 (H) 65 - 99 mg/dL   BUN 20 6 - 20 mg/dL   Creatinine, Ser 0.66 0.44 - 1.00 mg/dL   Calcium 9.7 8.9 - 10.3 mg/dL   GFR calc non Af Amer >60 >60 mL/min   GFR calc Af Amer >60 >60 mL/min    Comment: (NOTE) The eGFR has been calculated using the CKD EPI equation. This calculation has not been validated in all clinical situations. eGFR's persistently <60 mL/min signify possible Chronic Kidney Disease.    Anion gap 7 5 - 15    Comment: Performed at [facility], [place] [address] [place], [place] [address]  CBC with Differential/Platelet     Status: Abnormal   Collection Time: 02/14/18  4:41 AM  Result Value Ref Range   WBC 15.8 (H) 4.0 - 10.5 K/uL   RBC 4.01 3.87 - 5.11 MIL/uL   Hemoglobin 12.4 12.0 - 15.0 g/dL   HCT 36.4 36.0 - 46.0 %   MCV 90.8 78.0 - 100.0 fL   MCH 30.9 26.0 - 34.0 pg    MCHC 34.1 30.0 - 36.0 g/dL   RDW 12.9 11.5 - 15.5 %   Platelets 320 150 - 400 K/uL   Neutrophils Relative % 92 %   Neutro Abs 14.5 (H) 1.7 - 7.7 K/uL   Lymphocytes Relative 5 %   Lymphs Abs 0.7 0.7 - 4.0 K/uL   Monocytes Relative 3 %   Monocytes Absolute 0.5 0.1 - 1.0 K/uL   Eosinophils Relative 0 %   Eosinophils Absolute 0.0 0.0 - 0.7 K/uL   Basophils Relative 0 %   Basophils Absolute 0.0 0.0 - 0.1 K/uL    Comment: Performed at [facility], [place] [address] [place], [place] [address]  Magnesium     Status: None   Collection Time: 02/14/18  4:41 AM  Result Value Ref Range   Magnesium 2.2 1.7 - 2.4 mg/dL    Comment: Performed at [facility], [place] [address], [place], [place] [address]    Studies/Results: Dg Abd 1 View  Result Date: 02/14/2018 CLINICAL DATA:  Bowel obstruction EXAM: ABDOMEN - 1 VIEW COMPARISON:  February 13, 2018 FINDINGS: Nasogastric tube tip and side port are in the duodenum, stable. There remain loops of dilated bowel, slightly less than on 1 day prior. No free air. Surgical clips noted in right upper quadrant. Lung bases are clear. IMPRESSION: Nasogastric tube position unchanged. Less bowel dilatation compared to 1 day prior. No air-fluid levels evident. No free air. Lung bases clear. Electronically Signed   By: Lowella Grip III M.D.   On: 02/14/2018 07:34   Dg Abd 1 View  Result Date: 02/13/2018 CLINICAL DATA:  Small bowel obstruction EXAM: ABDOMEN - 1 VIEW COMPARISON:  February 12, 2018 FINDINGS: Nasogastric tube tip and side port are in the proximal duodenum. There remain loops of dilated small bowel in a pattern felt to represent persistent obstruction. No free air. There are surgical clips in the gallbladder fossa region. IMPRESSION: Evidence of a degree of bowel obstruction, similar to 1 day prior. Stable nasogastric tube positioning. No evident free air. Electronically Signed   By: Lowella Grip III M.D.   On: 02/13/2018  10:44   Dg Abd Portable 1v-small Bowel Obstruction Protocol-initial, 8 Hr Delay  Result Date: 02/13/2018 CLINICAL  DATA:  Small bowel obstruction.  8 hour delayed film. EXAM: PORTABLE ABDOMEN - 1 VIEW COMPARISON:  Abdominal radiograph yesterday.  CT 02/08/2018 FINDINGS: Administered enteric contrast is seen within dilated small bowel in the central abdomen. Enteric contrast has not yet reached the colon. Enteric tube tip in side-port below the diaphragm in the stomach in the right upper abdomen. IMPRESSION: Administered enteric contrast remains within dilated small bowel in the central lower abdomen and has not reached the colon. Recommend 24 hour delayed film. Electronically Signed   By: Jeb Levering M.D.   On: 02/13/2018 00:13    Medications: I have reviewed the patient's current medications.  Assessment: Small bowel obstruction,normal renal function and electrolytes, mild leukocytosis,no improvement in clinical picture despite using IV Solu-Medrol for 3 days. CAT scan performed today-results pending.  Plan: Reviewed notes from his surgery-will likely need surgery depending upon CAT scan findings performed today.   Ronnette Juniper 02/14/2018, 12:49 PM   Pager [phone] If no answer or after 5 PM call [phone]

## 2018-02-15 ENCOUNTER — Inpatient Hospital Stay (HOSPITAL_COMMUNITY): Payer: Medicare Other | Admitting: Certified Registered Nurse Anesthetist

## 2018-02-15 ENCOUNTER — Encounter (HOSPITAL_COMMUNITY)
Admission: EM | Disposition: A | Discharge status: Home / Self Care | Payer: Self-pay | Source: Home / Self Care | Attending: Internal Medicine

## 2018-02-15 ENCOUNTER — Encounter (HOSPITAL_COMMUNITY): Payer: Self-pay | Admitting: Certified Registered Nurse Anesthetist

## 2018-02-15 ENCOUNTER — Inpatient Hospital Stay (HOSPITAL_COMMUNITY): Payer: Medicare Other

## 2018-02-15 HISTORY — PX: LAPAROSCOPY: SHX197

## 2018-02-15 LAB — CBC WITH DIFFERENTIAL/PLATELET
BASOS ABS: 0 10*3/uL (ref 0.0–0.1)
Basophils Relative: 0 %
EOS ABS: 0.1 10*3/uL (ref 0.0–0.7)
EOS PCT: 1 %
HCT: 38.8 % (ref 36.0–46.0)
Hemoglobin: 13.4 g/dL (ref 12.0–15.0)
Lymphocytes Relative: 13 %
Lymphs Abs: 1.5 10*3/uL (ref 0.7–4.0)
MCH: 31.1 pg (ref 26.0–34.0)
MCHC: 34.5 g/dL (ref 30.0–36.0)
MCV: 90 fL (ref 78.0–100.0)
MONO ABS: 0.9 10*3/uL (ref 0.1–1.0)
Monocytes Relative: 8 %
Neutro Abs: 8.7 10*3/uL — ABNORMAL HIGH (ref 1.7–7.7)
Neutrophils Relative %: 78 %
PLATELETS: 318 10*3/uL (ref 150–400)
RBC: 4.31 MIL/uL (ref 3.87–5.11)
RDW: 13.2 % (ref 11.5–15.5)
WBC: 11.1 10*3/uL — ABNORMAL HIGH (ref 4.0–10.5)

## 2018-02-15 LAB — BASIC METABOLIC PANEL
ANION GAP: 9 (ref 5–15)
BUN: 20 mg/dL (ref 6–20)
CALCIUM: 9.9 mg/dL (ref 8.9–10.3)
CO2: 28 mmol/L (ref 22–32)
Chloride: 99 mmol/L — ABNORMAL LOW (ref 101–111)
Creatinine, Ser: 0.76 mg/dL (ref 0.44–1.00)
GFR calc Af Amer: >60 mL/min (ref >60)
Glucose, Bld: 104 mg/dL — ABNORMAL HIGH (ref 65–99)
Potassium: 3.9 mmol/L (ref 3.5–5.1)
Sodium: 136 mmol/L (ref 135–145)

## 2018-02-15 LAB — MAGNESIUM: Magnesium: 2.3 mg/dL (ref 1.7–2.4)

## 2018-02-15 SURGERY — LAPAROSCOPY, DIAGNOSTIC
Anesthesia: General | Site: Abdomen

## 2018-02-15 MED ORDER — EPHEDRINE SULFATE-NACL 50-0.9 MG/10ML-% IV SOSY
PREFILLED_SYRINGE | INTRAVENOUS | Status: DC | PRN
  Administered 2018-02-15: 10 mg via INTRAVENOUS

## 2018-02-15 MED ORDER — 0.9 % SODIUM CHLORIDE (POUR BTL) OPTIME
TOPICAL | Status: DC | PRN
  Administered 2018-02-15: 2000 mL

## 2018-02-15 MED ORDER — SUGAMMADEX SODIUM 200 MG/2ML IV SOLN
INTRAVENOUS | Status: AC
  Filled 2018-02-15: qty 2

## 2018-02-15 MED ORDER — MEPERIDINE HCL 50 MG/ML IJ SOLN: 6.2500 mg | INTRAMUSCULAR | Status: DC | PRN

## 2018-02-15 MED ORDER — PHENYLEPHRINE 40 MCG/ML (10ML) SYRINGE FOR IV PUSH (FOR BLOOD PRESSURE SUPPORT)
PREFILLED_SYRINGE | INTRAVENOUS | Status: DC | PRN
  Administered 2018-02-15: 80 ug via INTRAVENOUS

## 2018-02-15 MED ORDER — ONDANSETRON HCL 4 MG/2ML IJ SOLN
INTRAMUSCULAR | Status: DC | PRN
  Administered 2018-02-15: 4 mg via INTRAVENOUS

## 2018-02-15 MED ORDER — CHLORHEXIDINE GLUCONATE CLOTH 2 % EX PADS: 6.0000 | MEDICATED_PAD | Freq: Once | CUTANEOUS | Status: DC

## 2018-02-15 MED ORDER — ONDANSETRON HCL 4 MG/2ML IJ SOLN
INTRAMUSCULAR | Status: AC
  Filled 2018-02-15: qty 2

## 2018-02-15 MED ORDER — LIDOCAINE 2% (20 MG/ML) 5 ML SYRINGE
INTRAMUSCULAR | Status: DC | PRN
  Administered 2018-02-15: 100 mg via INTRAVENOUS

## 2018-02-15 MED ORDER — SUCCINYLCHOLINE CHLORIDE 200 MG/10ML IV SOSY
PREFILLED_SYRINGE | INTRAVENOUS | Status: AC
  Filled 2018-02-15: qty 10

## 2018-02-15 MED ORDER — BUPIVACAINE-EPINEPHRINE (PF) 0.25% -1:200000 IJ SOLN
INTRAMUSCULAR | Status: AC
  Filled 2018-02-15: qty 30

## 2018-02-15 MED ORDER — HYDROMORPHONE HCL 1 MG/ML IJ SOLN: 0.2500 mg | INTRAMUSCULAR | Status: DC | PRN

## 2018-02-15 MED ORDER — ONDANSETRON HCL 4 MG/2ML IJ SOLN: 4.0000 mg | Freq: Once | INTRAMUSCULAR | Status: DC | PRN

## 2018-02-15 MED ORDER — LIDOCAINE 2% (20 MG/ML) 5 ML SYRINGE
INTRAMUSCULAR | Status: AC
  Filled 2018-02-15: qty 5

## 2018-02-15 MED ORDER — DEXAMETHASONE SODIUM PHOSPHATE 10 MG/ML IJ SOLN
INTRAMUSCULAR | Status: AC
  Filled 2018-02-15: qty 1

## 2018-02-15 MED ORDER — BUPIVACAINE-EPINEPHRINE 0.25% -1:200000 IJ SOLN
INTRAMUSCULAR | Status: DC | PRN
  Administered 2018-02-15: 8 mL

## 2018-02-15 MED ORDER — FENTANYL CITRATE (PF) 100 MCG/2ML IJ SOLN
INTRAMUSCULAR | Status: AC
  Filled 2018-02-15: qty 2

## 2018-02-15 MED ORDER — SODIUM CHLORIDE 0.9 % IV SOLN: 2.0000 g | INTRAVENOUS | Status: DC

## 2018-02-15 MED ORDER — FENTANYL CITRATE (PF) 100 MCG/2ML IJ SOLN
INTRAMUSCULAR | Status: DC | PRN
  Administered 2018-02-15 (×2): 50 ug via INTRAVENOUS

## 2018-02-15 MED ORDER — KETOROLAC TROMETHAMINE 15 MG/ML IJ SOLN
15.0000 mg | Freq: Four times a day (QID) | INTRAMUSCULAR | Status: DC | PRN
  Administered 2018-02-15 (×2): 15 mg via INTRAVENOUS
  Filled 2018-02-15: qty 1

## 2018-02-15 MED ORDER — SUGAMMADEX SODIUM 200 MG/2ML IV SOLN
INTRAVENOUS | Status: DC | PRN
  Administered 2018-02-15: 200 mg via INTRAVENOUS

## 2018-02-15 MED ORDER — SODIUM CHLORIDE 0.9 % IV SOLN
INTRAVENOUS | Status: AC
  Filled 2018-02-15: qty 2

## 2018-02-15 MED ORDER — DEXAMETHASONE SODIUM PHOSPHATE 10 MG/ML IJ SOLN
INTRAMUSCULAR | Status: DC | PRN
  Administered 2018-02-15: 10 mg via INTRAVENOUS

## 2018-02-15 MED ORDER — PHENYLEPHRINE 40 MCG/ML (10ML) SYRINGE FOR IV PUSH (FOR BLOOD PRESSURE SUPPORT)
PREFILLED_SYRINGE | INTRAVENOUS | Status: AC
  Filled 2018-02-15: qty 10

## 2018-02-15 MED ORDER — SUCCINYLCHOLINE CHLORIDE 200 MG/10ML IV SOSY
PREFILLED_SYRINGE | INTRAVENOUS | Status: DC | PRN
  Administered 2018-02-15: 140 mg via INTRAVENOUS

## 2018-02-15 MED ORDER — ROCURONIUM BROMIDE 10 MG/ML (PF) SYRINGE
PREFILLED_SYRINGE | INTRAVENOUS | Status: AC
  Filled 2018-02-15: qty 5

## 2018-02-15 MED ORDER — LACTATED RINGERS IV SOLN
INTRAVENOUS | Status: DC | PRN
  Administered 2018-02-15: 10:00:00 via INTRAVENOUS

## 2018-02-15 MED ORDER — ROCURONIUM BROMIDE 50 MG/5ML IV SOSY
PREFILLED_SYRINGE | INTRAVENOUS | Status: DC | PRN
  Administered 2018-02-15 (×2): 20 mg via INTRAVENOUS

## 2018-02-15 MED ORDER — PROPOFOL 10 MG/ML IV BOLUS
INTRAVENOUS | Status: DC | PRN
  Administered 2018-02-15: 130 mg via INTRAVENOUS

## 2018-02-15 MED ORDER — EPHEDRINE 5 MG/ML INJ
INTRAVENOUS | Status: AC
  Filled 2018-02-15: qty 10

## 2018-02-15 MED ORDER — KETOROLAC TROMETHAMINE 15 MG/ML IJ SOLN
INTRAMUSCULAR | Status: AC
  Filled 2018-02-15: qty 1

## 2018-02-15 MED ORDER — LACTATED RINGERS IR SOLN
Status: DC | PRN
  Administered 2018-02-15: 1000 mL

## 2018-02-15 SURGICAL SUPPLY — 51 items
ADH SKN CLS APL DERMABOND .7 (GAUZE/BANDAGES/DRESSINGS) ×2
BLADE CLIPPER SURG (BLADE) IMPLANT
BLADE EXTENDED COATED 6.5IN (ELECTRODE) IMPLANT
CHLORAPREP W/TINT 26ML (MISCELLANEOUS) ×4 IMPLANT
CLOSURE STERI-STRIP 1/4X4 (GAUZE/BANDAGES/DRESSINGS) ×3 IMPLANT
DECANTER SPIKE VIAL GLASS SM (MISCELLANEOUS) IMPLANT
DERMABOND ADVANCED (GAUZE/BANDAGES/DRESSINGS) ×2
DERMABOND ADVANCED .7 DNX12 (GAUZE/BANDAGES/DRESSINGS) ×2 IMPLANT
DRAPE LAPAROSCOPIC ABDOMINAL (DRAPES) ×4 IMPLANT
DRAPE WARM FLUID 44X44 (DRAPE) ×4 IMPLANT
ELECT REM PT RETURN 15FT ADLT (MISCELLANEOUS) ×4 IMPLANT
GAUZE SPONGE 2X2 8PLY STRL LF (GAUZE/BANDAGES/DRESSINGS) ×2 IMPLANT
GAUZE SPONGE 4X4 12PLY STRL (GAUZE/BANDAGES/DRESSINGS) ×4 IMPLANT
GLOVE BIO SURGEON STRL SZ7.5 (GLOVE) ×4 IMPLANT
GOWN STRL REUS W/TWL LRG LVL3 (GOWN DISPOSABLE) ×12 IMPLANT
GOWN STRL REUS W/TWL XL LVL3 (GOWN DISPOSABLE) ×8 IMPLANT
HANDLE SUCTION POOLE (INSTRUMENTS) ×2 IMPLANT
IRRIG SUCT STRYKERFLOW 2 WTIP (MISCELLANEOUS)
IRRIGATION SUCT STRKRFLW 2 WTP (MISCELLANEOUS) IMPLANT
KIT BASIN OR (CUSTOM PROCEDURE TRAY) ×4 IMPLANT
KIT ROOM TURNOVER OR (KITS) ×4 IMPLANT
MARKER SKIN DUAL TIP RULER LAB (MISCELLANEOUS) ×4 IMPLANT
NDL INSUFFLATION 14GA 120MM (NEEDLE) ×1 IMPLANT
NEEDLE INSUFFLATION 14GA 120MM (NEEDLE) ×4 IMPLANT
NS IRRIG 1000ML POUR BTL (IV SOLUTION) ×8 IMPLANT
PACK GENERAL/GYN (CUSTOM PROCEDURE TRAY) ×4 IMPLANT
PAD ARMBOARD 7.5X6 YLW CONV (MISCELLANEOUS) ×8 IMPLANT
SHEARS HARMONIC ACE PLUS 36CM (ENDOMECHANICALS) IMPLANT
SLEEVE XCEL OPT CAN 5 100 (ENDOMECHANICALS) ×4 IMPLANT
SOLUTION ANTI FOG 6CC (MISCELLANEOUS) ×4 IMPLANT
SPECIMEN JAR LARGE (MISCELLANEOUS) IMPLANT
SPONGE GAUZE 2X2 STER 10/PKG (GAUZE/BANDAGES/DRESSINGS) ×2
SPONGE LAP 18X18 RF (DISPOSABLE) IMPLANT
STAPLER VISISTAT 35W (STAPLE) ×4 IMPLANT
SUCTION POOLE HANDLE (INSTRUMENTS) ×4
SUT PDS AB 1 TP1 96 (SUTURE) ×8 IMPLANT
SUT SILK 2 0 SH CR/8 (SUTURE) ×4 IMPLANT
SUT SILK 2 0 TIES 10X30 (SUTURE) ×4 IMPLANT
SUT SILK 3 0 SH 30 (SUTURE) ×6 IMPLANT
SUT SILK 3 0 SH CR/8 (SUTURE) ×4 IMPLANT
SUT SILK 3 0 TIES 10X30 (SUTURE) ×4 IMPLANT
SUT VIC AB 4-0 PS2 27 (SUTURE) IMPLANT
TOWEL OR 17X24 6PK STRL BLUE (TOWEL DISPOSABLE) ×4 IMPLANT
TOWEL OR 17X26 10 PK STRL BLUE (TOWEL DISPOSABLE) ×4 IMPLANT
TOWEL OR NON WOVEN STRL DISP B (DISPOSABLE) ×4 IMPLANT
TRAY FOLEY MTR SLVR 16FR STAT (SET/KITS/TRAYS/PACK) IMPLANT
TRAY LAPAROSCOPIC (CUSTOM PROCEDURE TRAY) ×4 IMPLANT
TROCAR BLADELESS OPT 5 100 (ENDOMECHANICALS) ×4 IMPLANT
TROCAR XCEL BLUNT TIP 100MML (ENDOMECHANICALS) IMPLANT
TROCAR XCEL NON-BLD 11X100MML (ENDOMECHANICALS) IMPLANT
YANKAUER SUCT BULB TIP NO VENT (SUCTIONS) IMPLANT

## 2018-02-15 NOTE — Anesthesia Procedure Notes (Signed)
Procedure Name: Intubation Date/Time: 02/15/2018 10:10 AM Performed by: Genelle Bal, CRNA Pre-anesthesia Checklist: Patient identified, Emergency Drugs available, Suction available and Patient being monitored Patient Re-evaluated:Patient Re-evaluated prior to induction Oxygen Delivery Method: Circle system utilized Preoxygenation: Pre-oxygenation with 100% oxygen Induction Type: IV induction, Rapid sequence and Cricoid Pressure applied Laryngoscope Size: Miller and 2 Grade View: Grade II Tube type: Oral Tube size: 7.0 mm Number of attempts: 1 Airway Equipment and Method: Stylet Placement Confirmation: ETT inserted through vocal cords under direct vision,  positive ETCO2 and breath sounds checked- equal and bilateral Secured at: 21 cm Tube secured with: Tape Dental Injury: Teeth and Oropharynx as per pre-operative assessment

## 2018-02-15 NOTE — Anesthesia Preprocedure Evaluation (Signed)
Anesthesia Evaluation  Patient identified by MRN, date of birth, ID band Patient awake    Reviewed: Allergy & Precautions, NPO status , Patient's Chart, lab work & pertinent test results  Airway Mallampati: I  TM Distance: >3 FB Neck ROM: Full    Dental   Pulmonary    Pulmonary exam normal        Cardiovascular hypertension, Pt. on medications Normal cardiovascular exam     Neuro/Psych    GI/Hepatic GERD  Medicated and Controlled,  Endo/Other    Renal/GU      Musculoskeletal   Abdominal   Peds  Hematology   Anesthesia Other Findings   Reproductive/Obstetrics                             Anesthesia Physical Anesthesia Plan  ASA: III  Anesthesia Plan: General   Post-op Pain Management:    Induction: Intravenous, Rapid sequence and Cricoid pressure planned  PONV Risk Score and Plan: 3 and Ondansetron and Midazolam  Airway Management Planned: Oral ETT  Additional Equipment:   Intra-op Plan:   Post-operative Plan: Extubation in OR  Informed Consent: I have reviewed the patients History and Physical, chart, labs and discussed the procedure including the risks, benefits and alternatives for the proposed anesthesia with the patient or authorized representative who has indicated his/her understanding and acceptance.     Plan Discussed with: CRNA and Surgeon  Anesthesia Plan Comments:         Anesthesia Quick Evaluation

## 2018-02-15 NOTE — Progress Notes (Signed)
[facility] Progress Note     Subjective: CC- SBO Patient reports no change. Continues to complain of abdominal bloating. No flatus. Small loose BM yesterday. NG tube with >2L out again over the last 24 hours.  CT scan shows persistent partial SBO with transition point in the RLQ.  Objective: Vital signs in last 24 hours: Temp:  [98 F (36.7 C)-98.4 F (36.9 C)] 98.4 F (36.9 C) (06/06 0527) Pulse Rate:  [73-101] 85 (06/06 0527) Resp:  [16-18] 18 (06/06 0527) BP: (118-148)/(67-87) 139/87 (06/06 0527) SpO2:  [94 %-98 %] 97 % (06/06 0527) Weight:  [155 lb 10.3 oz (70.6 kg)] 155 lb 10.3 oz (70.6 kg) (06/06 0347) Last BM Date: 02/14/18(very small per pt)  Intake/Output from previous day: 06/05 0701 - 06/06 0700 In: 2450 [I.V.:1200; NG/GT:1100; IV Piggyback:150] Out: 3400 [Urine:1550; Emesis/NG output:1850] Intake/Output this shift: No intake/output data recorded.  PE: Gen: Alert, NAD, pleasant HEENT: EOM's intact, pupils equal and round Card: RRR, no M/G/R heard Pulm: CTAB, no W/R/R, effort normal Abd: Soft,mild distension, mild lower abdominal TTP without rebound or guarding,+BS LYY:TKPTWS soft and nontender Skin: no rashes noted, warm and dry  Lab Results:  Recent Labs    02/14/18 0441 02/15/18 0439  WBC 15.8* 11.1*  HGB 12.4 13.4  HCT 36.4 38.8  PLT 320 318   BMET Recent Labs    02/14/18 0441 02/15/18 0439  NA 134* 136  K 4.4 3.9  CL 99* 99*  CO2 28 28  GLUCOSE 121* 104*  BUN 20 20  CREATININE 0.66 0.76  CALCIUM 9.7 9.9   PT/INR No results for input(s): LABPROT, INR in the last 72 hours. CMP     Component Value Date/Time   NA 136 02/15/2018 0439   NA 140 07/30/2015 0950   K 3.9 02/15/2018 0439   CL 99 (L) 02/15/2018 0439   CO2 28 02/15/2018 0439   GLUCOSE 104 (H) 02/15/2018 0439   BUN 20 02/15/2018 0439   BUN 13 07/30/2015 0950   CREATININE 0.76 02/15/2018 0439   CREATININE 0.66 02/06/2018 0904   CALCIUM 9.9 02/15/2018  0439   PROT 5.9 (L) 02/12/2018 0417   PROT 6.3 07/30/2015 0950   ALBUMIN 2.9 (L) 02/12/2018 0417   ALBUMIN 4.1 07/30/2015 0950   AST 55 (H) 02/12/2018 0417   ALT 41 02/12/2018 0417   ALKPHOS 61 02/12/2018 0417   BILITOT 0.8 02/12/2018 0417   BILITOT 0.5 07/30/2015 0950   GFRNONAA >60 02/15/2018 0439   GFRNONAA 84 02/06/2018 0904   GFRAA >60 02/15/2018 0439   GFRAA 97 02/06/2018 0904   Lipase     Component Value Date/Time   LIPASE 22 02/08/2018 0500       Studies/Results: Dg Abd 1 View  Result Date: 02/14/2018 CLINICAL DATA:  Bowel obstruction EXAM: ABDOMEN - 1 VIEW COMPARISON:  February 13, 2018 FINDINGS: Nasogastric tube tip and side port are in the duodenum, stable. There remain loops of dilated bowel, slightly less than on 1 day prior. No free air. Surgical clips noted in right upper quadrant. Lung bases are clear. IMPRESSION: Nasogastric tube position unchanged. Less bowel dilatation compared to 1 day prior. No air-fluid levels evident. No free air. Lung bases clear. Electronically Signed   By: Lowella Grip III M.D.   On: 02/14/2018 07:34   Dg Abd 1 View  Result Date: 02/13/2018 CLINICAL DATA:  Small bowel obstruction EXAM: ABDOMEN - 1 VIEW COMPARISON:  February 12, 2018 FINDINGS: Nasogastric tube tip and side port  are in the proximal duodenum. There remain loops of dilated small bowel in a pattern felt to represent persistent obstruction. No free air. There are surgical clips in the gallbladder fossa region. IMPRESSION: Evidence of a degree of bowel obstruction, similar to 1 day prior. Stable nasogastric tube positioning. No evident free air. Electronically Signed   By: Lowella Grip III M.D.   On: 02/13/2018 10:44   Ct Abdomen Pelvis W Contrast  Result Date: 02/14/2018 CLINICAL DATA:  Abdominal pain and bloating. Follow-up small bowel obstruction. EXAM: CT ABDOMEN AND PELVIS WITH CONTRAST TECHNIQUE: Multidetector CT imaging of the abdomen and pelvis was performed using the  standard protocol following bolus administration of intravenous contrast. CONTRAST:  18m ISOVUE-300 IOPAMIDOL (ISOVUE-300) INJECTION 61% COMPARISON:  02/08/2018 FINDINGS: Lower chest: New small bilateral pleural effusions and mild right lower lobe atelectasis. Hepatobiliary: 2.7 cm benign hemangioma is seen in segment 4B adjacent to the porta hepatis. A 1 cm flash-filling hemangioma is seen in segment 7. These are stable since previous study. No new or enlarging liver lesions are identified. Prior cholecystectomy. No evidence of biliary obstruction. Pancreas:  No mass or inflammatory changes. Spleen:  Within normal limits in size and appearance. Adrenals/Urinary Tract: No masses identified. No evidence of hydronephrosis. Stomach/Bowel: A large hiatal hernia is again seen. A nasogastric tube is now seen with tip in the proximal duodenum. There is persistent moderate dilatation of small bowel loops, with transition point in the anterior right lower quadrant. No mass or inflammatory process seen in this region. This is suspicious for adhesion. Distal small bowel and colon are nondistended. Vascular/Lymphatic: No pathologically enlarged lymph nodes identified. No abdominal aortic aneurysm. Aortic atherosclerosis. Reproductive: Small postmenopausal uterus is again seen, with diffuse thickening of the endometrium measuring approximately 15 mm. No evidence of extra uterine extension. No adnexal mass identified. Tiny amount of free fluid seen in the dependent pelvis. Other:  None. Musculoskeletal:  No suspicious bone lesions identified. IMPRESSION: Persistent partial small bowel obstruction, with transition point in the anterior right lower quadrant, likely due to adhesion. New small bilateral pleural effusions and minimal ascites in pelvis. Large hiatal hernia.  Nasogastric tube tip in proximal duodenum. Persistent abnormal endometrial thickening. Endometrial carcinoma cannot be excluded in a postmenopausal female.  Recommend GYN consultation for endometrial sampling. Small benign hepatic hemangiomas. Electronically Signed   By: JEarle GellM.D.   On: 02/14/2018 13:05    Anti-infectives: Anti-infectives (From admission, onward)   Start     Dose/Rate Route Frequency Ordered Stop   02/09/18 1600  piperacillin-tazobactam (ZOSYN) IVPB 3.375 g     3.375 g 12.5 mL/hr over 240 Minutes Intravenous Every 8 hours 02/09/18 1145     02/08/18 1200  Ampicillin-Sulbactam (UNASYN) 3 g in sodium chloride 0.9 % 100 mL IVPB  Status:  Discontinued     3 g 200 mL/hr over 30 Minutes Intravenous Every 6 hours 02/08/18 1127 02/09/18 1130       Assessment/Plan HTN HLD GERD Chronic diastolic dysfunction  Partial SBO with terminal ileal thickening -no improvement with abx or 3 day trial of steroids -repeat CT scan 6/5 shows persistent partial SBO with transition point in the RLQ -NG tube with high output  ID -unasyn 5/30>>5/31, zosyn 5/31>> FEN -IVF, NPO/NGT VTE -SCDs, lovenox Foley - none  Plan - Patient with persistent SBO symptoms despite several days of conservative management. Discussed with MD, will plan for diagnostic laparoscopy possible ex lap today. Continue NPO/NG tube.   LOS: 7 days  Wellington Hampshire , [facility] 02/15/2018, 8:09 AM Pager: [phone] Consults: [phone] Mon 7:00 am -11:30 AM Tues-Fri 7:00 am-4:30 pm Sat-Sun 7:00 am-11:30 am

## 2018-02-15 NOTE — Transfer of Care (Signed)
Immediate Anesthesia Transfer of Care Note  Patient: Brooke Cox  Procedure(s) Performed: LAPAROSCOPY DIAGNOSTIC, LYSIS OF ADHESION (N/A Abdomen)  Patient Location: PACU  Anesthesia Type:General  Level of Consciousness: awake, alert  and oriented  Airway & Oxygen Therapy: Patient Spontanous Breathing and Patient connected to face mask oxygen  Post-op Assessment: Report given to RN and Post -op Vital signs reviewed and stable  Post vital signs: Reviewed and stable  Last Vitals:  Vitals Value Taken Time  BP 119/73 02/15/2018 11:15 AM  Temp    Pulse 86 02/15/2018 11:17 AM  Resp 13 02/15/2018 11:17 AM  SpO2 99 % 02/15/2018 11:17 AM  Vitals shown include unvalidated device data.  Last Pain:  Vitals:   02/15/18 0949  TempSrc:   PainSc: 0-No pain      Patients Stated Pain Goal: 2 (50/15/86 8257)  Complications: No apparent anesthesia complications

## 2018-02-15 NOTE — Progress Notes (Signed)
Patient underwent diagnostic laparoscopy with adhesiolysis today for small bowel obstruction. GI will sign off,further management as per surgical team. Please recall if needed.

## 2018-02-15 NOTE — Progress Notes (Signed)
Patient ID: Brooke Cox, female   DOB: 1940-12-05, 77 y.o.   MRN: [id number]  PROGRESS NOTE    Brooke Cox  TDS:[id number] DOB: 04/24/1941 DOA: 02/08/2018 PCP: [name], NP   Brief Narrative: 77 year old female with history of hypertension, CHF, diverticulosis and GERD presented on 02/08/2018 with complaints of severe abdominal pain with nausea and vomiting.  CT of the abdomen and pelvis showed small bowel thickening and inflammatory change in the right lower quadrant and element of partial small bowel obstruction and dilated proximal small bowel loops, ? infectious versus inflammatory versus ischemia.  General surgery and GI were consulted.   Assessment & Plan:   Principal Problem:   Ileitis, terminal ([place]) Active Problems:   Benign hypertensive heart disease without heart failure   Hyperlipidemia with target LDL less than 130   Sigmoid diverticulosis   Obesity (BMI 30.0-34.9)   Partial small bowel obstruction (HCC)   Partial small bowel obstruction in the setting of infectious versus inflammatory process -General surgery and GI following.  -Still has persistent NG tube drainage.  Symptoms are not improving despite several days of conservative management.  Probable surgical intervention today by general surgery.  Continue n.p.o. and NG tube. -Currently on empiric trial of antibiotics/Zosyn.  She was also empirically treated with Solu-Medrol for 3 days for question of inflammatory bowel disease and had no response and was discontinued.  Leukocytosis -Improving.  Repeat a.m. labs   chronic diastolic dysfunction -No signs of fluid overload -Strict input output.  Daily weights  Essential hypertension -Monitor blood pressure.  Continue IV metoprolol while n.p.o.  Hepatic steatosis -Incidental finding on CT abdomen/pelvis, also 2 lesions likely consistent with hemangioma, recommend six-month follow-up with MRI as outpatient  Endometrial thickening -Incidental finding  on CT abdomen and pelvis, recommend outpatient ultrasound and gynecology evaluation as an outpatient.    DVT prophylaxis: Lovenox Code Status: DNR Family Communication: None at bedside Disposition Plan: Depends on clinical outcome  Consultants: General surgery/GI  Procedures: None  Antimicrobials: Unasyn on 01/22/2018-02/09/2018   Zosyn from 02/09/2018 onwards   Subjective: Patient seen and examined at bedside.  She denies any overnight worsening abdominal pain, nausea, vomiting or fever. Objective: Vitals:   02/14/18 1343 02/14/18 2026 02/15/18 0347 02/15/18 0527  BP: 136/67 126/74 118/71 139/87  Pulse: 87 (!) 101 (!) 101 85  Resp: 16 18  18   Temp: 98 F (36.7 C) 98.2 F (36.8 C)  98.4 F (36.9 C)  TempSrc: Oral Oral  Oral  SpO2: 97% 94%  97%  Weight:   70.6 kg (155 lb 10.3 oz)   Height:        Intake/Output Summary (Last 24 hours) at 02/15/2018 0824 Last data filed at 02/15/2018 0600 Gross per 24 hour  Intake 2450 ml  Output 3400 ml  Net -950 ml   Filed Weights   02/13/18 0508 02/14/18 0749 02/15/18 0347  Weight: 73.2 kg (161 lb 6 oz) 70.7 kg (155 lb 13.8 oz) 70.6 kg (155 lb 10.3 oz)    Examination:  General exam: Appears calm and comfortable.  NG tube in place. no distress Respiratory system: Bilateral decreased breath sound at bases with no tachypnea Cardiovascular system: Rate controlled, S1-S2 positive gastrointestinal system: Abdomen is nondistended, soft and nontender.  Bowel sounds still sluggish Extremities: No cyanosis; trace edema      Data Reviewed: I have personally reviewed following labs and imaging studies  CBC: Recent Labs  Lab 02/10/18 0451 02/11/18 0402 02/12/18 0417 02/13/18 0419 02/14/18 0441 02/15/18  0439  WBC 14.2* 12.9* 10.8* 13.5* 15.8* 11.1*  NEUTROABS 12.2*  --  10.0* 12.3* 14.5* 8.7*  HGB 12.4 13.2 12.3 12.5 12.4 13.4  HCT 37.9 38.8 36.5 36.1 36.4 38.8  MCV 93.1 91.3 91.5 89.1 90.8 90.0  PLT 287 292 297 243 320 161    Basic Metabolic Panel: Recent Labs  Lab 02/11/18 0402 02/12/18 0417 02/13/18 0419 02/14/18 0441 02/15/18 0439  NA 139 137 135 134* 136  K 3.1* 3.2* 4.4 4.4 3.9  CL 103 103 103 99* 99*  CO2 26 25 23 28 28   GLUCOSE 112* 141* 141* 121* 104*  BUN 24* 22* 18 20 20   CREATININE 0.68 0.60 0.61 0.66 0.76  CALCIUM 9.5 9.2 9.4 9.7 9.9  MG 2.0 2.0 2.0 2.2 2.3   GFR: Estimated Creatinine Clearance: 48.8 mL/min (by C-G formula based on SCr of 0.76 mg/dL). Liver Function Tests: Recent Labs  Lab 02/12/18 0417  AST 55*  ALT 41  ALKPHOS 61  BILITOT 0.8  PROT 5.9*  ALBUMIN 2.9*   No results for input(s): LIPASE, AMYLASE in the last 168 hours. No results for input(s): AMMONIA in the last 168 hours. Coagulation Profile: No results for input(s): INR, PROTIME in the last 168 hours. Cardiac Enzymes: No results for input(s): CKTOTAL, CKMB, CKMBINDEX, TROPONINI in the last 168 hours. BNP (last 3 results) No results for input(s): PROBNP in the last 8760 hours. HbA1C: No results for input(s): HGBA1C in the last 72 hours. CBG: No results for input(s): GLUCAP in the last 168 hours. Lipid Profile: No results for input(s): CHOL, HDL, LDLCALC, TRIG, CHOLHDL, LDLDIRECT in the last 72 hours. Thyroid Function Tests: No results for input(s): TSH, T4TOTAL, FREET4, T3FREE, THYROIDAB in the last 72 hours. Anemia Panel: No results for input(s): VITAMINB12, FOLATE, FERRITIN, TIBC, IRON, RETICCTPCT in the last 72 hours. Sepsis Labs: No results for input(s): PROCALCITON, LATICACIDVEN in the last 168 hours.  Recent Results (from the past 240 hour(s))  Surgical pcr screen     Status: None   Collection Time: 02/14/18  5:28 PM  Result Value Ref Range Status   MRSA, PCR NEGATIVE NEGATIVE Final   Staphylococcus aureus NEGATIVE NEGATIVE Final    Comment: (NOTE) The Xpert SA Assay (FDA approved for NASAL specimens in patients 59 years of age and older), is one component of a comprehensive surveillance  program. It is not intended to diagnose infection nor to guide or monitor treatment. Performed at [facility], [place] [address] [place], [place] [address]          Radiology Studies: Dg Abd 1 View  Result Date: 02/14/2018 CLINICAL DATA:  Bowel obstruction EXAM: ABDOMEN - 1 VIEW COMPARISON:  February 13, 2018 FINDINGS: Nasogastric tube tip and side port are in the duodenum, stable. There remain loops of dilated bowel, slightly less than on 1 day prior. No free air. Surgical clips noted in right upper quadrant. Lung bases are clear. IMPRESSION: Nasogastric tube position unchanged. Less bowel dilatation compared to 1 day prior. No air-fluid levels evident. No free air. Lung bases clear. Electronically Signed   By: Lowella Grip III M.D.   On: 02/14/2018 07:34   Dg Abd 1 View  Result Date: 02/13/2018 CLINICAL DATA:  Small bowel obstruction EXAM: ABDOMEN - 1 VIEW COMPARISON:  February 12, 2018 FINDINGS: Nasogastric tube tip and side port are in the proximal duodenum. There remain loops of dilated small bowel in a pattern felt to represent persistent obstruction. No free air. There  are surgical clips in the gallbladder fossa region. IMPRESSION: Evidence of a degree of bowel obstruction, similar to 1 day prior. Stable nasogastric tube positioning. No evident free air. Electronically Signed   By: Lowella Grip III M.D.   On: 02/13/2018 10:44   Ct Abdomen Pelvis W Contrast  Result Date: 02/14/2018 CLINICAL DATA:  Abdominal pain and bloating. Follow-up small bowel obstruction. EXAM: CT ABDOMEN AND PELVIS WITH CONTRAST TECHNIQUE: Multidetector CT imaging of the abdomen and pelvis was performed using the standard protocol following bolus administration of intravenous contrast. CONTRAST:  182m ISOVUE-300 IOPAMIDOL (ISOVUE-300) INJECTION 61% COMPARISON:  02/08/2018 FINDINGS: Lower chest: New small bilateral pleural effusions and mild right lower lobe atelectasis. Hepatobiliary: 2.7 cm benign  hemangioma is seen in segment 4B adjacent to the porta hepatis. A 1 cm flash-filling hemangioma is seen in segment 7. These are stable since previous study. No new or enlarging liver lesions are identified. Prior cholecystectomy. No evidence of biliary obstruction. Pancreas:  No mass or inflammatory changes. Spleen:  Within normal limits in size and appearance. Adrenals/Urinary Tract: No masses identified. No evidence of hydronephrosis. Stomach/Bowel: A large hiatal hernia is again seen. A nasogastric tube is now seen with tip in the proximal duodenum. There is persistent moderate dilatation of small bowel loops, with transition point in the anterior right lower quadrant. No mass or inflammatory process seen in this region. This is suspicious for adhesion. Distal small bowel and colon are nondistended. Vascular/Lymphatic: No pathologically enlarged lymph nodes identified. No abdominal aortic aneurysm. Aortic atherosclerosis. Reproductive: Small postmenopausal uterus is again seen, with diffuse thickening of the endometrium measuring approximately 15 mm. No evidence of extra uterine extension. No adnexal mass identified. Tiny amount of free fluid seen in the dependent pelvis. Other:  None. Musculoskeletal:  No suspicious bone lesions identified. IMPRESSION: Persistent partial small bowel obstruction, with transition point in the anterior right lower quadrant, likely due to adhesion. New small bilateral pleural effusions and minimal ascites in pelvis. Large hiatal hernia.  Nasogastric tube tip in proximal duodenum. Persistent abnormal endometrial thickening. Endometrial carcinoma cannot be excluded in a postmenopausal female. Recommend GYN consultation for endometrial sampling. Small benign hepatic hemangiomas. Electronically Signed   By: JEarle GellM.D.   On: 02/14/2018 13:05   Dg Abd Portable 1v  Result Date: 02/15/2018 CLINICAL DATA:  Small-bowel obstruction, follow-up, persistent abdominal discomfort and  distension EXAM: PORTABLE ABDOMEN - 1 VIEW COMPARISON:  Portable exam 0717 hours compared to 02/14/2018 FINDINGS: Question small pleural effusion atelectasis at RIGHT lung base. Tip of nasogastric tube projects over RIGHT mid abdomen at the distal second portion of the duodenum. Persistent small bowel dilatation. No bowel wall thickening. Paucity of colonic gas. Excreted contrast within bladder. Surgical clips RIGHT upper quadrant from cholecystectomy. Bones demineralized with degenerative disc and facet disease changes of lower lumbar spine. IMPRESSION: Persistent small bowel dilatation with paucity of colonic gas consistent with small bowel obstruction. Electronically Signed   By: MLavonia DanaM.D.   On: 02/15/2018 08:08        Scheduled Meds: . bisacodyl  10 mg Rectal Daily  . enoxaparin (LOVENOX) injection  40 mg Subcutaneous Q24H  . lip balm  1 application Topical BID  . metoprolol tartrate  10 mg Intravenous Q6H   Continuous Infusions: . dextrose 5 % and 0.45 % NaCl with KCl 40 mEq/L 1,000 mL (02/15/18 0349)  . methocarbamol (ROBAXIN)  IV    . ondansetron (ZOFRAN) IV    . piperacillin-tazobactam (ZOSYN)  IV  3.375 g (02/15/18 0750)     LOS: 7 days        Aline August, MD [facility] Pager [phone]  If 7PM-7AM, please contact night-coverage [email or web address] Password [facility] 02/15/2018, 8:24 AM

## 2018-02-15 NOTE — Op Note (Signed)
02/15/2018  10:56 AM  PATIENT:  Brooke Cox  77 y.o. female  PRE-OPERATIVE DIAGNOSIS:  SMALL BOWEL OBSTRUCTION  POST-OPERATIVE DIAGNOSIS:  SMALL BOWEL OBSTRUCTION due to adhesion  PROCEDURE:  Procedure(s): LAPAROSCOPY DIAGNOSTIC & ADHESIOLYSIS(N/A)  SURGEON:  Surgeon(s) and Role:    Ralene Ok, MD - Primary   ASSISTANTS: Melina Modena, PA-C   ANESTHESIA:   local and general  EBL:  minimal   BLOOD ADMINISTERED:none  DRAINS: none   LOCAL MEDICATIONS USED:  BUPIVICAINE   SPECIMEN:  No Specimen  DISPOSITION OF SPECIMEN:  N/A  COUNTS:  YES  TOURNIQUET:  * No tourniquets in log *  DICTATION: .Dragon Dictation Indication for procedure: Patient is a 77 year old female with history of small bowel obstruction on CT scan.  Patient was thought to have some thickening of her terminal ileum.  Patient was placed on steroids to see if this was can help resolve the issue however it did not.  Patient appeared to have continued small bowel obstruction that was unresolving.  She was taken back to the operating for diagnostic laparoscopy.  Findings: Patient had a one adhesive band that was congested in the mid ileum.  This was lysed.  There was some congestion of the small bowel however there is no overt ischemia/necrosis.  There was one area of the small bowel in this area that appeared to have some punctate discoloration.  A 3-0 silk was used to imbricate this in a Lembert fashion.  Details of procedure: After the patient was consented she was taken back to the operating room and placed in the supine position with bilateral SCDs in place.  All facts were verified and a time out was called and all facts verified.  A Veress needle technique was used to insufflate the abdomen to 15 mmHg in the left subcostal margin.  Subsequent to this a 5 mm trocar and camera were then placed intra-abdominally.  There was no injury to intra-abdominal organs.  The patient the patient in the headdown  position.  2 more 5 mm trochars were then placed in the left lower quadrant in the epigastrium under direct visualization.  At this time the area of the dilated small bowel transition into the collapsed small bowel could easily be seen.  There is an area of omentum that was adhered to the small bowel.  This was lysed sharply.  There was one thickened band of scar tissue that was overlying the small bowel.  This was cut.  This is the area of this transition point.  The small bowel that was being compressed dilated to approximately normal small bowel diameter.  The area just distally this appeared to be congested.  There is no area of overt ischemia.  There was one punctate area of discoloration.  A 3-0 silk was used to imbricate this in a Lembert fashion.  I then proceeded to run the small bowel distally to the terminal ileum.  There is no thickening of the normal small bowel was seen in this area.  At this time the insufflation was evacuated.  All trochars were removed.  The skin was reapproximated using 4-0 Monocryl subcuticular fashion.  The skin was dressed with Dermabond.  The patient taught the procedure well was taken to the recovery in stable condition.  PLAN OF CARE: Admit to inpatient   PATIENT DISPOSITION:  PACU - hemodynamically stable.   Delay start of Pharmacological VTE agent (>24hrs) due to surgical blood loss or risk of bleeding: no

## 2018-02-15 NOTE — Plan of Care (Signed)
Plan of care discussed with patient and family

## 2018-02-15 NOTE — Anesthesia Postprocedure Evaluation (Signed)
Anesthesia Post Note  Patient: Brooke Cox  Procedure(s) Performed: LAPAROSCOPY DIAGNOSTIC, LYSIS OF ADHESION (N/A Abdomen)     Patient location during evaluation: PACU Anesthesia Type: General Level of consciousness: awake and alert Pain management: pain level controlled Vital Signs Assessment: post-procedure vital signs reviewed and stable Respiratory status: spontaneous breathing, nonlabored ventilation, respiratory function stable and patient connected to nasal cannula oxygen Cardiovascular status: blood pressure returned to baseline and stable Postop Assessment: no apparent nausea or vomiting Anesthetic complications: no    Last Vitals:  Vitals:   02/15/18 0527 02/15/18 0900  BP: 139/87 130/76  Pulse: 85 92  Resp: 18   Temp: 36.9 C   SpO2: 97% 96%    Last Pain:  Vitals:   02/15/18 0949  TempSrc:   PainSc: 0-No pain                 Jazlynne Milliner DAVID

## 2018-02-16 ENCOUNTER — Encounter (HOSPITAL_COMMUNITY): Payer: Self-pay | Admitting: General Surgery

## 2018-02-16 LAB — BASIC METABOLIC PANEL
ANION GAP: 7 (ref 5–15)
BUN: 19 mg/dL (ref 6–20)
CALCIUM: 9.6 mg/dL (ref 8.9–10.3)
CO2: 28 mmol/L (ref 22–32)
CREATININE: 0.81 mg/dL (ref 0.44–1.00)
Chloride: 102 mmol/L (ref 101–111)
GFR calc Af Amer: >60 mL/min (ref >60)
Glucose, Bld: 112 mg/dL — ABNORMAL HIGH (ref 65–99)
Potassium: 3.8 mmol/L (ref 3.5–5.1)
Sodium: 137 mmol/L (ref 135–145)

## 2018-02-16 LAB — CBC WITH DIFFERENTIAL/PLATELET
BASOS ABS: 0 10*3/uL (ref 0.0–0.1)
BASOS PCT: 0 %
EOS PCT: 0 %
Eosinophils Absolute: 0 10*3/uL (ref 0.0–0.7)
HEMATOCRIT: 36.7 % (ref 36.0–46.0)
Hemoglobin: 12.2 g/dL (ref 12.0–15.0)
Lymphocytes Relative: 8 %
Lymphs Abs: 1.1 10*3/uL (ref 0.7–4.0)
MCH: 30.7 pg (ref 26.0–34.0)
MCHC: 33.2 g/dL (ref 30.0–36.0)
MCV: 92.4 fL (ref 78.0–100.0)
MONO ABS: 1.1 10*3/uL — AB (ref 0.1–1.0)
MONOS PCT: 8 %
NEUTROS ABS: 11 10*3/uL — AB (ref 1.7–7.7)
Neutrophils Relative %: 84 %
PLATELETS: 317 10*3/uL (ref 150–400)
RBC: 3.97 MIL/uL (ref 3.87–5.11)
RDW: 13.1 % (ref 11.5–15.5)
WBC: 13.2 10*3/uL — ABNORMAL HIGH (ref 4.0–10.5)

## 2018-02-16 LAB — MAGNESIUM: Magnesium: 2.2 mg/dL (ref 1.7–2.4)

## 2018-02-16 NOTE — Progress Notes (Signed)
Patient ID: Brooke Cox, female   DOB: [birth year], 77 y.o.   MRN: [id number]  PROGRESS NOTE    Breezie Micucci  MOL:[id number] DOB: 1941-02-17 DOA: 02/08/2018 PCP: [name], NP   Brief Narrative: 77 year old female with history of hypertension, CHF, diverticulosis and GERD presented on 02/08/2018 with complaints of severe abdominal pain with nausea and vomiting.  CT of the abdomen and pelvis showed small bowel thickening and inflammatory change in the right lower quadrant and element of partial small bowel obstruction and dilated proximal small bowel loops, ? infectious versus inflammatory versus ischemia.  General surgery and GI were consulted.   Assessment & Plan:   Principal Problem:   Ileitis, terminal ([place]) Active Problems:   Benign hypertensive heart disease without heart failure   Hyperlipidemia with target LDL less than 130   Sigmoid diverticulosis   Obesity (BMI 30.0-34.9)   Partial small bowel obstruction (HCC)   Partial small bowel obstruction in the setting of infectious versus inflammatory process -Patient was empirically treated with Solu-Medrol for 3 days for question of intermittent bowel disease and had no response and was discontinued. -Currently still on Zosyn.  Will follow-up with general surgery regarding the need for further antibiotics -Status post lap scopic diagnostic and adhesiolysis on 02/15/2018 by general surgery.  Patient still has NG tube.  Follow further general surgery recommendations.  Continue n.p.o. for now   Leukocytosis -Probably reactive.  Repeat a.m. labs   chronic diastolic dysfunction -No signs of fluid overload -Strict input output.  Daily weights  Essential hypertension -Monitor blood pressure.  Continue IV metoprolol while n.p.o.  Hepatic steatosis -Incidental finding on CT abdomen/pelvis, also 2 lesions likely consistent with hemangioma, recommend six-month follow-up with MRI as outpatient  Endometrial  thickening -Incidental finding on CT abdomen and pelvis, recommend outpatient ultrasound and gynecology evaluation as an outpatient.    DVT prophylaxis: Lovenox Code Status: DNR Family Communication: None at bedside Disposition Plan: Depends on clinical outcome  Consultants: General surgery/GI  Procedures: None  Antimicrobials: Unasyn on 01/22/2018-02/09/2018   Zosyn from 02/09/2018 onwards   Subjective: Patient seen and examined at bedside.  She is passing gas.  No overnight fever, nausea, vomiting.  No worsening shortness of breath.   Objective: Vitals:   02/15/18 2031 02/16/18 0149 02/16/18 0500 02/16/18 0557  BP: 136/72 138/78  (!) 149/83  Pulse: 85 95  83  Resp: 18 16  18   Temp: 99.2 F (37.3 C) 98.5 F (36.9 C)  97.6 F (36.4 C)  TempSrc: Oral Oral  Oral  SpO2: 97% 97%  100%  Weight:   68.2 kg (150 lb 5.7 oz)   Height:        Intake/Output Summary (Last 24 hours) at 02/16/2018 0822 Last data filed at 02/16/2018 0631 Gross per 24 hour  Intake 1110 ml  Output 2465 ml  Net -1355 ml   Filed Weights   02/14/18 0749 02/15/18 0347 02/16/18 0500  Weight: 70.7 kg (155 lb 13.8 oz) 70.6 kg (155 lb 10.3 oz) 68.2 kg (150 lb 5.7 oz)    Examination:  General exam: No acute distress.  NG tube in place. Respiratory system: Bilateral decreased breath sound at bases with no tachypnea no crackles Cardiovascular system: S1-S2 positive, rate controlled gastrointestinal system: Abdomen is nondistended, soft and nontender.  Bowel sounds still sluggish Extremities: No cyanosis; trace edema      Data Reviewed: I have personally reviewed following labs and imaging studies  CBC: Recent Labs  Lab 02/12/18 0417 02/13/18 0419  02/14/18 0441 02/15/18 0439 02/16/18 0422  WBC 10.8* 13.5* 15.8* 11.1* 13.2*  NEUTROABS 10.0* 12.3* 14.5* 8.7* 11.0*  HGB 12.3 12.5 12.4 13.4 12.2  HCT 36.5 36.1 36.4 38.8 36.7  MCV 91.5 89.1 90.8 90.0 92.4  PLT 297 243 320 318 182   Basic Metabolic  Panel: Recent Labs  Lab 02/12/18 0417 02/13/18 0419 02/14/18 0441 02/15/18 0439 02/16/18 0422  NA 137 135 134* 136 137  K 3.2* 4.4 4.4 3.9 3.8  CL 103 103 99* 99* 102  CO2 25 23 28 28 28   GLUCOSE 141* 141* 121* 104* 112*  BUN 22* 18 20 20 19   CREATININE 0.60 0.61 0.66 0.76 0.81  CALCIUM 9.2 9.4 9.7 9.9 9.6  MG 2.0 2.0 2.2 2.3 2.2   GFR: Estimated Creatinine Clearance: 47.3 mL/min (by C-G formula based on SCr of 0.81 mg/dL). Liver Function Tests: Recent Labs  Lab 02/12/18 0417  AST 55*  ALT 41  ALKPHOS 61  BILITOT 0.8  PROT 5.9*  ALBUMIN 2.9*   No results for input(s): LIPASE, AMYLASE in the last 168 hours. No results for input(s): AMMONIA in the last 168 hours. Coagulation Profile: No results for input(s): INR, PROTIME in the last 168 hours. Cardiac Enzymes: No results for input(s): CKTOTAL, CKMB, CKMBINDEX, TROPONINI in the last 168 hours. BNP (last 3 results) No results for input(s): PROBNP in the last 8760 hours. HbA1C: No results for input(s): HGBA1C in the last 72 hours. CBG: No results for input(s): GLUCAP in the last 168 hours. Lipid Profile: No results for input(s): CHOL, HDL, LDLCALC, TRIG, CHOLHDL, LDLDIRECT in the last 72 hours. Thyroid Function Tests: No results for input(s): TSH, T4TOTAL, FREET4, T3FREE, THYROIDAB in the last 72 hours. Anemia Panel: No results for input(s): VITAMINB12, FOLATE, FERRITIN, TIBC, IRON, RETICCTPCT in the last 72 hours. Sepsis Labs: No results for input(s): PROCALCITON, LATICACIDVEN in the last 168 hours.  Recent Results (from the past 240 hour(s))  Surgical pcr screen     Status: None   Collection Time: 02/14/18  5:28 PM  Result Value Ref Range Status   MRSA, PCR NEGATIVE NEGATIVE Final   Staphylococcus aureus NEGATIVE NEGATIVE Final    Comment: (NOTE) The Xpert SA Assay (FDA approved for NASAL specimens in patients 75 years of age and older), is one component of a comprehensive surveillance program. It is not  intended to diagnose infection nor to guide or monitor treatment. Performed at [facility], [place] [address], [place], [place] [address]          Radiology Studies: Ct Abdomen Pelvis W Contrast  Result Date: 02/14/2018 CLINICAL DATA:  Abdominal pain and bloating. Follow-up small bowel obstruction. EXAM: CT ABDOMEN AND PELVIS WITH CONTRAST TECHNIQUE: Multidetector CT imaging of the abdomen and pelvis was performed using the standard protocol following bolus administration of intravenous contrast. CONTRAST:  182m ISOVUE-300 IOPAMIDOL (ISOVUE-300) INJECTION 61% COMPARISON:  02/08/2018 FINDINGS: Lower chest: New small bilateral pleural effusions and mild right lower lobe atelectasis. Hepatobiliary: 2.7 cm benign hemangioma is seen in segment 4B adjacent to the porta hepatis. A 1 cm flash-filling hemangioma is seen in segment 7. These are stable since previous study. No new or enlarging liver lesions are identified. Prior cholecystectomy. No evidence of biliary obstruction. Pancreas:  No mass or inflammatory changes. Spleen:  Within normal limits in size and appearance. Adrenals/Urinary Tract: No masses identified. No evidence of hydronephrosis. Stomach/Bowel: A large hiatal hernia is again seen. A nasogastric tube is now seen with tip  in the proximal duodenum. There is persistent moderate dilatation of small bowel loops, with transition point in the anterior right lower quadrant. No mass or inflammatory process seen in this region. This is suspicious for adhesion. Distal small bowel and colon are nondistended. Vascular/Lymphatic: No pathologically enlarged lymph nodes identified. No abdominal aortic aneurysm. Aortic atherosclerosis. Reproductive: Small postmenopausal uterus is again seen, with diffuse thickening of the endometrium measuring approximately 15 mm. No evidence of extra uterine extension. No adnexal mass identified. Tiny amount of free fluid seen in the dependent pelvis.  Other:  None. Musculoskeletal:  No suspicious bone lesions identified. IMPRESSION: Persistent partial small bowel obstruction, with transition point in the anterior right lower quadrant, likely due to adhesion. New small bilateral pleural effusions and minimal ascites in pelvis. Large hiatal hernia.  Nasogastric tube tip in proximal duodenum. Persistent abnormal endometrial thickening. Endometrial carcinoma cannot be excluded in a postmenopausal female. Recommend GYN consultation for endometrial sampling. Small benign hepatic hemangiomas. Electronically Signed   By: Earle Gell M.D.   On: 02/14/2018 13:05   Dg Abd Portable 1v  Result Date: 02/15/2018 CLINICAL DATA:  Small-bowel obstruction, follow-up, persistent abdominal discomfort and distension EXAM: PORTABLE ABDOMEN - 1 VIEW COMPARISON:  Portable exam 0717 hours compared to 02/14/2018 FINDINGS: Question small pleural effusion atelectasis at RIGHT lung base. Tip of nasogastric tube projects over RIGHT mid abdomen at the distal second portion of the duodenum. Persistent small bowel dilatation. No bowel wall thickening. Paucity of colonic gas. Excreted contrast within bladder. Surgical clips RIGHT upper quadrant from cholecystectomy. Bones demineralized with degenerative disc and facet disease changes of lower lumbar spine. IMPRESSION: Persistent small bowel dilatation with paucity of colonic gas consistent with small bowel obstruction. Electronically Signed   By: Lavonia Dana M.D.   On: 02/15/2018 08:08        Scheduled Meds: . bisacodyl  10 mg Rectal Daily  . enoxaparin (LOVENOX) injection  40 mg Subcutaneous Q24H  . lip balm  1 application Topical BID  . metoprolol tartrate  10 mg Intravenous Q6H   Continuous Infusions: . dextrose 5 % and 0.45 % NaCl with KCl 40 mEq/L 50 mL/hr at 02/15/18 2241  . methocarbamol (ROBAXIN)  IV    . ondansetron (ZOFRAN) IV    . piperacillin-tazobactam (ZOSYN)  IV 3.375 g (02/16/18 0752)     LOS: 8 days         Aline August, MD [facility] Pager [phone]  If 7PM-7AM, please contact night-coverage [email or web address] Password [email or web address] 02/16/2018, 8:22 AM

## 2018-02-16 NOTE — Progress Notes (Signed)
[facility] Progress Note  1 Day Post-Op  Subjective: CC-  Patient states that her abdomen is a little sore but she denies any true pain. Denies n/v. Passing a lot of flatus. No BM. She has already been up ambulating in the halls twice this morning.  Objective: Vital signs in last 24 hours: Temp:  [97.6 F (36.4 C)-99.2 F (37.3 C)] 97.6 F (36.4 C) (06/07 0557) Pulse Rate:  [74-95] 83 (06/07 0557) Resp:  [12-22] 18 (06/07 0557) BP: (116-149)/(64-83) 149/83 (06/07 0557) SpO2:  [96 %-100 %] 100 % (06/07 0557) Weight:  [150 lb 5.7 oz (68.2 kg)] 150 lb 5.7 oz (68.2 kg) (06/07 0500) Last BM Date: 02/14/18  Intake/Output from previous day: 06/06 0701 - 06/07 0700 In: 1110 [P.O.:60; I.V.:1000; IV Piggyback:50] Out: 2815 [Urine:1250; Emesis/NG output:1550; Blood:15] Intake/Output this shift: No intake/output data recorded.  PE: Gen: Alert, NAD, pleasant HEENT: EOM's intact, pupils equal and round Card: RRR, no M/G/R heard Pulm: CTAB, no W/R/R, effort normal Abd: Soft,mild distension, nontender,+BS in all 4 quadrants DXA:JOINOM soft and nontender Skin: no rashes noted, warm and dry  Lab Results:  Recent Labs    02/15/18 0439 02/16/18 0422  WBC 11.1* 13.2*  HGB 13.4 12.2  HCT 38.8 36.7  PLT 318 317   BMET Recent Labs    02/15/18 0439 02/16/18 0422  NA 136 137  K 3.9 3.8  CL 99* 102  CO2 28 28  GLUCOSE 104* 112*  BUN 20 19  CREATININE 0.76 0.81  CALCIUM 9.9 9.6   PT/INR No results for input(s): LABPROT, INR in the last 72 hours. CMP     Component Value Date/Time   NA 137 02/16/2018 0422   NA 140 07/30/2015 0950   K 3.8 02/16/2018 0422   CL 102 02/16/2018 0422   CO2 28 02/16/2018 0422   GLUCOSE 112 (H) 02/16/2018 0422   BUN 19 02/16/2018 0422   BUN 13 07/30/2015 0950   CREATININE 0.81 02/16/2018 0422   CREATININE 0.66 02/06/2018 0904   CALCIUM 9.6 02/16/2018 0422   PROT 5.9 (L) 02/12/2018 0417   PROT 6.3 07/30/2015 0950    ALBUMIN 2.9 (L) 02/12/2018 0417   ALBUMIN 4.1 07/30/2015 0950   AST 55 (H) 02/12/2018 0417   ALT 41 02/12/2018 0417   ALKPHOS 61 02/12/2018 0417   BILITOT 0.8 02/12/2018 0417   BILITOT 0.5 07/30/2015 0950   GFRNONAA >60 02/16/2018 0422   GFRNONAA 84 02/06/2018 0904   GFRAA >60 02/16/2018 0422   GFRAA 97 02/06/2018 0904   Lipase     Component Value Date/Time   LIPASE 22 02/08/2018 0500       Studies/Results: Ct Abdomen Pelvis W Contrast  Result Date: 02/14/2018 CLINICAL DATA:  Abdominal pain and bloating. Follow-up small bowel obstruction. EXAM: CT ABDOMEN AND PELVIS WITH CONTRAST TECHNIQUE: Multidetector CT imaging of the abdomen and pelvis was performed using the standard protocol following bolus administration of intravenous contrast. CONTRAST:  183m ISOVUE-300 IOPAMIDOL (ISOVUE-300) INJECTION 61% COMPARISON:  02/08/2018 FINDINGS: Lower chest: New small bilateral pleural effusions and mild right lower lobe atelectasis. Hepatobiliary: 2.7 cm benign hemangioma is seen in segment 4B adjacent to the porta hepatis. A 1 cm flash-filling hemangioma is seen in segment 7. These are stable since previous study. No new or enlarging liver lesions are identified. Prior cholecystectomy. No evidence of biliary obstruction. Pancreas:  No mass or inflammatory changes. Spleen:  Within normal limits in size and appearance. Adrenals/Urinary Tract: No masses identified. No evidence  of hydronephrosis. Stomach/Bowel: A large hiatal hernia is again seen. A nasogastric tube is now seen with tip in the proximal duodenum. There is persistent moderate dilatation of small bowel loops, with transition point in the anterior right lower quadrant. No mass or inflammatory process seen in this region. This is suspicious for adhesion. Distal small bowel and colon are nondistended. Vascular/Lymphatic: No pathologically enlarged lymph nodes identified. No abdominal aortic aneurysm. Aortic atherosclerosis. Reproductive: Small  postmenopausal uterus is again seen, with diffuse thickening of the endometrium measuring approximately 15 mm. No evidence of extra uterine extension. No adnexal mass identified. Tiny amount of free fluid seen in the dependent pelvis. Other:  None. Musculoskeletal:  No suspicious bone lesions identified. IMPRESSION: Persistent partial small bowel obstruction, with transition point in the anterior right lower quadrant, likely due to adhesion. New small bilateral pleural effusions and minimal ascites in pelvis. Large hiatal hernia.  Nasogastric tube tip in proximal duodenum. Persistent abnormal endometrial thickening. Endometrial carcinoma cannot be excluded in a postmenopausal female. Recommend GYN consultation for endometrial sampling. Small benign hepatic hemangiomas. Electronically Signed   By: Earle Gell M.D.   On: 02/14/2018 13:05   Dg Abd Portable 1v  Result Date: 02/15/2018 CLINICAL DATA:  Small-bowel obstruction, follow-up, persistent abdominal discomfort and distension EXAM: PORTABLE ABDOMEN - 1 VIEW COMPARISON:  Portable exam 0717 hours compared to 02/14/2018 FINDINGS: Question small pleural effusion atelectasis at RIGHT lung base. Tip of nasogastric tube projects over RIGHT mid abdomen at the distal second portion of the duodenum. Persistent small bowel dilatation. No bowel wall thickening. Paucity of colonic gas. Excreted contrast within bladder. Surgical clips RIGHT upper quadrant from cholecystectomy. Bones demineralized with degenerative disc and facet disease changes of lower lumbar spine. IMPRESSION: Persistent small bowel dilatation with paucity of colonic gas consistent with small bowel obstruction. Electronically Signed   By: Lavonia Dana M.D.   On: 02/15/2018 08:08    Anti-infectives: Anti-infectives (From admission, onward)   Start     Dose/Rate Route Frequency Ordered Stop   02/15/18 0934  sodium chloride 0.9 % with cefoTEtan (CEFOTAN) ADS Med    Note to Pharmacy:  Kyra Leyland    : cabinet override      02/15/18 0934 02/15/18 2144   02/15/18 0915  cefoTEtan (CEFOTAN) 2 g in sodium chloride 0.9 % 100 mL IVPB  Status:  Discontinued     2 g 200 mL/hr over 30 Minutes Intravenous On call to O.R. 02/15/18 0910 02/15/18 1230   02/09/18 1600  piperacillin-tazobactam (ZOSYN) IVPB 3.375 g     3.375 g 12.5 mL/hr over 240 Minutes Intravenous Every 8 hours 02/09/18 1145     02/08/18 1200  Ampicillin-Sulbactam (UNASYN) 3 g in sodium chloride 0.9 % 100 mL IVPB  Status:  Discontinued     3 g 200 mL/hr over 30 Minutes Intravenous Every 6 hours 02/08/18 1127 02/09/18 1130       Assessment/Plan HTN HLD GERD Chronic diastolic dysfunction  Partial SBO S/p LAPAROSCOPY DIAGNOSTIC & ADHESIOLYSIS 6/6 Dr. Rosendo Gros - POD 1 - NG tube with 1550cc output last 24 hours, passing a lot of flatus, no BM  ID -unasyn 5/30>>5/31, zosyn 5/31>>6/7 FEN -IVF, NPO/NGT VTE -SCDs, lovenox Foley - none  Plan - Continue NPO/NG tube and await return in bowel function. Continue ambulating. Hopefully will be able to d/c NG tube tomorrow. Labs in AM.   LOS: 8 days    Wellington Hampshire , [facility] 02/16/2018, 8:21 AM Pager: [phone] Consults:  [phone] Mon 7:00 am -11:30 AM Tues-Fri 7:00 am-4:30 pm Sat-Sun 7:00 am-11:30 am

## 2018-02-17 LAB — CBC
HCT: 36.1 % (ref 36.0–46.0)
Hemoglobin: 12.1 g/dL (ref 12.0–15.0)
MCH: 30.7 pg (ref 26.0–34.0)
MCHC: 33.5 g/dL (ref 30.0–36.0)
MCV: 91.6 fL (ref 78.0–100.0)
PLATELETS: 295 10*3/uL (ref 150–400)
RBC: 3.94 MIL/uL (ref 3.87–5.11)
RDW: 13.4 % (ref 11.5–15.5)
WBC: 16.6 10*3/uL — ABNORMAL HIGH (ref 4.0–10.5)

## 2018-02-17 LAB — MAGNESIUM: MAGNESIUM: 2.2 mg/dL (ref 1.7–2.4)

## 2018-02-17 LAB — BASIC METABOLIC PANEL
Anion gap: 7 (ref 5–15)
BUN: 19 mg/dL (ref 6–20)
CALCIUM: 9.5 mg/dL (ref 8.9–10.3)
CO2: 27 mmol/L (ref 22–32)
CREATININE: 0.64 mg/dL (ref 0.44–1.00)
Chloride: 103 mmol/L (ref 101–111)
Glucose, Bld: 110 mg/dL — ABNORMAL HIGH (ref 65–99)
Potassium: 4.1 mmol/L (ref 3.5–5.1)
SODIUM: 137 mmol/L (ref 135–145)

## 2018-02-17 MED ORDER — KETOROLAC TROMETHAMINE 15 MG/ML IJ SOLN: 15.0000 mg | Freq: Four times a day (QID) | INTRAMUSCULAR | Status: DC | PRN

## 2018-02-17 MED ORDER — HYDROCORTISONE 1 % EX CREA: 1.0000 "application " | TOPICAL_CREAM | Freq: Three times a day (TID) | CUTANEOUS | Status: DC | PRN

## 2018-02-17 MED ORDER — ACETAMINOPHEN 650 MG RE SUPP: 650.0000 mg | Freq: Four times a day (QID) | RECTAL | Status: DC | PRN

## 2018-02-17 MED ORDER — ACETAMINOPHEN 325 MG PO TABS: 650.0000 mg | ORAL_TABLET | Freq: Four times a day (QID) | ORAL | Status: DC | PRN

## 2018-02-17 NOTE — Progress Notes (Signed)
Patient ID: Brooke Cox, female   DOB: 02-Oct-1940, 77 y.o.   MRN: [id number]  PROGRESS NOTE    Brooke Cox  TKW:[id number] DOB: April 26, 1941 DOA: 02/08/2018 PCP: [name], NP   Brief Narrative: 77 year old female with history of hypertension, CHF, diverticulosis and GERD presented on 02/08/2018 with complaints of severe abdominal pain with nausea and vomiting.  CT of the abdomen and pelvis showed small bowel thickening and inflammatory change in the right lower quadrant and element of partial small bowel obstruction and dilated proximal small bowel loops, ? infectious versus inflammatory versus ischemia.  General surgery and GI were consulted.   Assessment & Plan:   Principal Problem:   Ileitis, terminal ([place]) Active Problems:   Benign hypertensive heart disease without heart failure   Hyperlipidemia with target LDL less than 130   Sigmoid diverticulosis   Obesity (BMI 30.0-34.9)   Partial small bowel obstruction (HCC)   Partial small bowel obstruction in the setting of infectious versus inflammatory process -Patient was empirically treated with Solu-Medrol for 3 days for question of intermittent bowel disease and had no response and was discontinued. -Patient was also on Zosyn which was discontinued yesterday  -Status post lap scopic diagnostic and adhesiolysis on 02/15/2018 by general surgery.  NG tube has been removed today general surgery.  Diet as per general surgery recommendations. -Continue ambulation and incentive spirometry  Leukocytosis -Probably reactive.  Repeat a.m. labs   chronic diastolic dysfunction -No signs of fluid overload -Strict input output.  Daily weights  Essential hypertension -Monitor blood pressure.  Will restart oral and hypertensive once starts taking oral   hepatic steatosis -Incidental finding on CT abdomen/pelvis, also 2 lesions likely consistent with hemangioma, recommend six-month follow-up with MRI as outpatient  Endometrial  thickening -Incidental finding on CT abdomen and pelvis, recommend outpatient ultrasound and gynecology evaluation as an outpatient.    DVT prophylaxis: Lovenox Code Status: DNR Family Communication: None at bedside Disposition Plan: Depends on clinical outcome  Consultants: General surgery/GI  Procedures: None  Antimicrobials: Unasyn on 01/22/2018-02/09/2018   Zosyn from 02/09/2018 -02/16/2018  Subjective: Patient seen and examined at bedside.  She feels better.  No current abdominal pain, nausea, vomiting.  Passing gas and bowel.   Objective: Vitals:   02/16/18 2003 02/17/18 0254 02/17/18 0535 02/17/18 0549  BP: 139/70 126/67 137/74   Pulse: 89 (!) 104 91   Resp:   17   Temp: (!) 97.5 F (36.4 C)  98.5 F (36.9 C)   TempSrc: Oral  Oral   SpO2: 97% 94% 96%   Weight:    70 kg (154 lb 5.2 oz)  Height:        Intake/Output Summary (Last 24 hours) at 02/17/2018 0845 Last data filed at 02/17/2018 0700 Gross per 24 hour  Intake 1645.01 ml  Output 1378 ml  Net 267.01 ml   Filed Weights   02/15/18 0347 02/16/18 0500 02/17/18 0549  Weight: 70.6 kg (155 lb 10.3 oz) 68.2 kg (150 lb 5.7 oz) 70 kg (154 lb 5.2 oz)    Examination:  General exam: No acute distress.  NG tube has been removed. Respiratory system: Bilateral decreased breath sound at bases  Cardiovascular system: S1-S2 positive, rate controlled gastrointestinal system: Abdomen is nondistended, soft and nontender.  Extremities: No cyanosis; trace edema      Data Reviewed: I have personally reviewed following labs and imaging studies  CBC: Recent Labs  Lab 02/12/18 0417 02/13/18 0419 02/14/18 0441 02/15/18 0439 02/16/18 0422 02/17/18 0459  WBC 10.8* 13.5* 15.8* 11.1* 13.2* 16.6*  NEUTROABS 10.0* 12.3* 14.5* 8.7* 11.0*  --   HGB 12.3 12.5 12.4 13.4 12.2 12.1  HCT 36.5 36.1 36.4 38.8 36.7 36.1  MCV 91.5 89.1 90.8 90.0 92.4 91.6  PLT 297 243 320 318 317 240   Basic Metabolic Panel: Recent Labs  Lab  02/13/18 0419 02/14/18 0441 02/15/18 0439 02/16/18 0422 02/17/18 0459  NA 135 134* 136 137 137  K 4.4 4.4 3.9 3.8 4.1  CL 103 99* 99* 102 103  CO2 23 28 28 28 27   GLUCOSE 141* 121* 104* 112* 110*  BUN 18 20 20 19 19   CREATININE 0.61 0.66 0.76 0.81 0.64  CALCIUM 9.4 9.7 9.9 9.6 9.5  MG 2.0 2.2 2.3 2.2 2.2   GFR: Estimated Creatinine Clearance: 48.5 mL/min (by C-G formula based on SCr of 0.64 mg/dL). Liver Function Tests: Recent Labs  Lab 02/12/18 0417  AST 55*  ALT 41  ALKPHOS 61  BILITOT 0.8  PROT 5.9*  ALBUMIN 2.9*   No results for input(s): LIPASE, AMYLASE in the last 168 hours. No results for input(s): AMMONIA in the last 168 hours. Coagulation Profile: No results for input(s): INR, PROTIME in the last 168 hours. Cardiac Enzymes: No results for input(s): CKTOTAL, CKMB, CKMBINDEX, TROPONINI in the last 168 hours. BNP (last 3 results) No results for input(s): PROBNP in the last 8760 hours. HbA1C: No results for input(s): HGBA1C in the last 72 hours. CBG: No results for input(s): GLUCAP in the last 168 hours. Lipid Profile: No results for input(s): CHOL, HDL, LDLCALC, TRIG, CHOLHDL, LDLDIRECT in the last 72 hours. Thyroid Function Tests: No results for input(s): TSH, T4TOTAL, FREET4, T3FREE, THYROIDAB in the last 72 hours. Anemia Panel: No results for input(s): VITAMINB12, FOLATE, FERRITIN, TIBC, IRON, RETICCTPCT in the last 72 hours. Sepsis Labs: No results for input(s): PROCALCITON, LATICACIDVEN in the last 168 hours.  Recent Results (from the past 240 hour(s))  Surgical pcr screen     Status: None   Collection Time: 02/14/18  5:28 PM  Result Value Ref Range Status   MRSA, PCR NEGATIVE NEGATIVE Final   Staphylococcus aureus NEGATIVE NEGATIVE Final    Comment: (NOTE) The Xpert SA Assay (FDA approved for NASAL specimens in patients 71 years of age and older), is one component of a comprehensive surveillance program. It is not intended to diagnose infection  nor to guide or monitor treatment. Performed at [facility], [place] [address] [place], [place] [address]          Radiology Studies: No results found.      Scheduled Meds: . bisacodyl  10 mg Rectal Daily  . enoxaparin (LOVENOX) injection  40 mg Subcutaneous Q24H  . lip balm  1 application Topical BID  . metoprolol tartrate  10 mg Intravenous Q6H   Continuous Infusions: . dextrose 5 % and 0.45 % NaCl with KCl 40 mEq/L 1,000 mL (02/16/18 2120)  . methocarbamol (ROBAXIN)  IV    . ondansetron (ZOFRAN) IV       LOS: 9 days        Aline August, MD [facility] Pager [phone]  If 7PM-7AM, please contact night-coverage [email or web address] Password [email or web address] 02/17/2018, 8:45 AM

## 2018-02-17 NOTE — Evaluation (Signed)
Physical Therapy Evaluation Patient Details Name: Brooke Cox MRN: [id number] DOB: 06/13/1941 Today's Date: 02/17/2018   History of Present Illness  Pt is a 77 year old female with history of hypertension, CHF, diverticulosis and GERD. She presented on 02/08/2018 with complaints of severe abdominal pain with nausea and vomiting. CT revealed SBO.     Clinical Impression  PT eval complete. Pt is independent with all functional mobility. See below for further details. She reports she has been ambulating in the hallway since NG tube was removed. She has no questions or concerns regarding mobility at home. No further PT intervention indicated. PT signing off.    Follow Up Recommendations No PT follow up    Equipment Recommendations  None recommended by PT    Recommendations for Other Services       Precautions / Restrictions Precautions Precautions: None      Mobility  Bed Mobility Overal bed mobility: Independent                Transfers Overall transfer level: Independent Equipment used: None             General transfer comment: safe technique  Ambulation/Gait Ambulation/Gait assistance: Independent Ambulation Distance (Feet): 350 Feet Assistive device: None Gait Pattern/deviations: Step-through pattern;Decreased stride length Gait velocity: mildly decreased Gait velocity interpretation: 1.31 - 2.62 ft/sec, indicative of limited community ambulator General Gait Details: steady Development worker, international aid    Modified Rankin (Stroke Patients Only)       Balance Overall balance assessment: No apparent balance deficits (not formally assessed)                                           Pertinent Vitals/Pain Pain Assessment: No/denies pain    Home Living Family/patient expects to be discharged to:: Private residence Living Arrangements: Spouse/significant other Available Help at Discharge: Family;Available 24  hours/day Type of Home: House Home Access: Level entry     Home Layout: One level Home Equipment: None      Prior Function Level of Independence: Independent               Hand Dominance        Extremity/Trunk Assessment   Upper Extremity Assessment Upper Extremity Assessment: Overall WFL for tasks assessed    Lower Extremity Assessment Lower Extremity Assessment: Overall WFL for tasks assessed    Cervical / Trunk Assessment Cervical / Trunk Assessment: Normal  Communication   Communication: No difficulties  Cognition Arousal/Alertness: Awake/alert Behavior During Therapy: WFL for tasks assessed/performed Overall Cognitive Status: Within Functional Limits for tasks assessed                                        General Comments      Exercises     Assessment/Plan    PT Assessment Patent does not need any further PT services  PT Problem List         PT Treatment Interventions      PT Goals (Current goals can be found in the Care Plan section)  Acute Rehab PT Goals Patient Stated Goal: home PT Goal Formulation: All assessment and education complete, DC therapy    Frequency     Barriers to discharge  Co-evaluation               AM-PAC PT "6 Clicks" Daily Activity  Outcome Measure Difficulty turning over in bed (including adjusting bedclothes, sheets and blankets)?: None Difficulty moving from lying on back to sitting on the side of the bed? : None Difficulty sitting down on and standing up from a chair with arms (e.g., wheelchair, bedside commode, etc,.)?: A Little Help needed moving to and from a bed to chair (including a wheelchair)?: None Help needed walking in hospital room?: None Help needed climbing 3-5 steps with a railing? : None 6 Click Score: 23    End of Session Equipment Utilized During Treatment: Gait belt Activity Tolerance: Patient tolerated treatment well Patient left: in bed;with call  bell/phone within reach Nurse Communication: Mobility status PT Visit Diagnosis: Difficulty in walking, not elsewhere classified (R26.2)    Time: 1431-1450 PT Time Calculation (min) (ACUTE ONLY): 19 min   Charges:   PT Evaluation $PT Eval Low Complexity: 1 Low     PT G Codes:        Lorrin Goodell, PT  Office # [phone] Pager [phone]   Brooke Cox 02/17/2018, 3:23 PM

## 2018-02-17 NOTE — Progress Notes (Signed)
2 Days Post-Op   Subjective/Chief Complaint: Passing a lot of flatus, several loose bms, pain controlled, has been oob   Objective: Vital signs in last 24 hours: Temp:  [97.5 F (36.4 C)-98.6 F (37 C)] 98.5 F (36.9 C) (06/08 0535) Pulse Rate:  [89-104] 91 (06/08 0535) Resp:  [17-18] 17 (06/08 0535) BP: (118-139)/(67-85) 137/74 (06/08 0535) SpO2:  [94 %-99 %] 96 % (06/08 0535) Weight:  [70 kg (154 lb 5.2 oz)] 70 kg (154 lb 5.2 oz) (06/08 0549) Last BM Date: 02/16/18  Intake/Output from previous day: 06/07 0701 - 06/08 0700 In: 1425.8 [P.O.:30; I.V.:1035.8; NG/GT:360] Out: 1278 [Urine:126; Emesis/NG output:1150; Stool:2] Intake/Output this shift: No intake/output data recorded.  Resp: clear to auscultation bilaterally Cardio: regular rate and rhythm GI: soft bs present approp tender incisions clean  Lab Results:  Recent Labs    02/16/18 0422 02/17/18 0459  WBC 13.2* 16.6*  HGB 12.2 12.1  HCT 36.7 36.1  PLT 317 295   BMET Recent Labs    02/16/18 0422 02/17/18 0459  NA 137 137  K 3.8 4.1  CL 102 103  CO2 28 27  GLUCOSE 112* 110*  BUN 19 19  CREATININE 0.81 0.64  CALCIUM 9.6 9.5   PT/INR No results for input(s): LABPROT, INR in the last 72 hours. ABG No results for input(s): PHART, HCO3 in the last 72 hours.  Invalid input(s): PCO2, PO2  Studies/Results: Dg Abd Portable 1v  Result Date: 02/15/2018 CLINICAL DATA:  Small-bowel obstruction, follow-up, persistent abdominal discomfort and distension EXAM: PORTABLE ABDOMEN - 1 VIEW COMPARISON:  Portable exam 0717 hours compared to 02/14/2018 FINDINGS: Question small pleural effusion atelectasis at RIGHT lung base. Tip of nasogastric tube projects over RIGHT mid abdomen at the distal second portion of the duodenum. Persistent small bowel dilatation. No bowel wall thickening. Paucity of colonic gas. Excreted contrast within bladder. Surgical clips RIGHT upper quadrant from cholecystectomy. Bones demineralized with  degenerative disc and facet disease changes of lower lumbar spine. IMPRESSION: Persistent small bowel dilatation with paucity of colonic gas consistent with small bowel obstruction. Electronically Signed   By: Lavonia Dana M.D.   On: 02/15/2018 08:08    Anti-infectives: Anti-infectives (From admission, onward)   Start     Dose/Rate Route Frequency Ordered Stop   02/15/18 0934  sodium chloride 0.9 % with cefoTEtan (CEFOTAN) ADS Med    Note to Pharmacy:  Kyra Leyland   : cabinet override      02/15/18 0934 02/15/18 2144   02/15/18 0915  cefoTEtan (CEFOTAN) 2 g in sodium chloride 0.9 % 100 mL IVPB  Status:  Discontinued     2 g 200 mL/hr over 30 Minutes Intravenous On call to O.R. 02/15/18 0910 02/15/18 1230   02/09/18 1600  piperacillin-tazobactam (ZOSYN) IVPB 3.375 g  Status:  Discontinued     3.375 g 12.5 mL/hr over 240 Minutes Intravenous Every 8 hours 02/09/18 1145 02/16/18 0830   02/08/18 1200  Ampicillin-Sulbactam (UNASYN) 3 g in sodium chloride 0.9 % 100 mL IVPB  Status:  Discontinued     3 g 200 mL/hr over 30 Minutes Intravenous Every 6 hours 02/08/18 1127 02/09/18 1130      Assessment/Plan: POD 2 laparoscopic LOA for adhesive SBO- Rosendo Gros -has return of bowel function with normal exam -dc ng tube -clear liquids today VTE- scds, lovenox Dispo- likely home Monday if continues to do well Rolm Bookbinder 02/17/2018

## 2018-02-18 LAB — BASIC METABOLIC PANEL
Anion gap: 5 (ref 5–15)
BUN: 14 mg/dL (ref 6–20)
CALCIUM: 9.2 mg/dL (ref 8.9–10.3)
CHLORIDE: 105 mmol/L (ref 101–111)
CO2: 24 mmol/L (ref 22–32)
CREATININE: 0.61 mg/dL (ref 0.44–1.00)
GFR calc Af Amer: >60 mL/min (ref >60)
Glucose, Bld: 109 mg/dL — ABNORMAL HIGH (ref 65–99)
Potassium: 4.2 mmol/L (ref 3.5–5.1)
SODIUM: 134 mmol/L — AB (ref 135–145)

## 2018-02-18 LAB — CBC WITH DIFFERENTIAL/PLATELET
Basophils Absolute: 0 10*3/uL (ref 0.0–0.1)
Basophils Relative: 0 %
Eosinophils Absolute: 0.2 10*3/uL (ref 0.0–0.7)
Eosinophils Relative: 2 %
HCT: 34.9 % — ABNORMAL LOW (ref 36.0–46.0)
Hemoglobin: 11.6 g/dL — ABNORMAL LOW (ref 12.0–15.0)
Lymphocytes Relative: 15 %
Lymphs Abs: 2 10*3/uL (ref 0.7–4.0)
MCH: 30.8 pg (ref 26.0–34.0)
MCHC: 33.2 g/dL (ref 30.0–36.0)
MCV: 92.6 fL (ref 78.0–100.0)
Monocytes Absolute: 1.1 10*3/uL — ABNORMAL HIGH (ref 0.1–1.0)
Monocytes Relative: 8 %
Neutro Abs: 10 10*3/uL — ABNORMAL HIGH (ref 1.7–7.7)
Neutrophils Relative %: 75 %
Platelets: 283 10*3/uL (ref 150–400)
RBC: 3.77 MIL/uL — ABNORMAL LOW (ref 3.87–5.11)
RDW: 13.4 % (ref 11.5–15.5)
WBC: 13.3 10*3/uL — ABNORMAL HIGH (ref 4.0–10.5)

## 2018-02-18 LAB — MAGNESIUM: MAGNESIUM: 2 mg/dL (ref 1.7–2.4)

## 2018-02-18 NOTE — Progress Notes (Signed)
3 Days Post-Op   Subjective/Chief Complaint: Still passing flatus and stool, no emesis, tol clears, walking a lot, seen by PT   Objective: Vital signs in last 24 hours: Temp:  [98.1 F (36.7 C)-98.7 F (37.1 C)] 98.7 F (37.1 C) (06/09 0429) Pulse Rate:  [102-124] 119 (06/09 0429) Resp:  [18-22] 18 (06/09 0429) BP: (131-143)/(72-84) 136/84 (06/09 0429) SpO2:  [98 %-100 %] 100 % (06/09 0429) Weight:  [67.7 kg (149 lb 3.2 oz)] 67.7 kg (149 lb 3.2 oz) (06/09 0429) Last BM Date: 02/17/18  Intake/Output from previous day: 06/08 0701 - 06/09 0700 In: 1175 [I.V.:1175] Out: -  Intake/Output this shift: No intake/output data recorded.  GI: soft approp tender nondistended bs present incisions clean  Lab Results:  Recent Labs    02/17/18 0459 02/18/18 0344  WBC 16.6* 13.3*  HGB 12.1 11.6*  HCT 36.1 34.9*  PLT 295 283   BMET Recent Labs    02/17/18 0459 02/18/18 0344  NA 137 134*  K 4.1 4.2  CL 103 105  CO2 27 24  GLUCOSE 110* 109*  BUN 19 14  CREATININE 0.64 0.61  CALCIUM 9.5 9.2   PT/INR No results for input(s): LABPROT, INR in the last 72 hours. ABG No results for input(s): PHART, HCO3 in the last 72 hours.  Invalid input(s): PCO2, PO2  Studies/Results: No results found.  Anti-infectives: Anti-infectives (From admission, onward)   Start     Dose/Rate Route Frequency Ordered Stop   02/15/18 0934  sodium chloride 0.9 % with cefoTEtan (CEFOTAN) ADS Med    Note to Pharmacy:  Brooke Cox   : cabinet override      02/15/18 0934 02/15/18 2144   02/15/18 0915  cefoTEtan (CEFOTAN) 2 g in sodium chloride 0.9 % 100 mL IVPB  Status:  Discontinued     2 g 200 mL/hr over 30 Minutes Intravenous On call to O.R. 02/15/18 0910 02/15/18 1230   02/09/18 1600  piperacillin-tazobactam (ZOSYN) IVPB 3.375 g  Status:  Discontinued     3.375 g 12.5 mL/hr over 240 Minutes Intravenous Every 8 hours 02/09/18 1145 02/16/18 0830   02/08/18 1200  Ampicillin-Sulbactam (UNASYN) 3  g in sodium chloride 0.9 % 100 mL IVPB  Status:  Discontinued     3 g 200 mL/hr over 30 Minutes Intravenous Every 6 hours 02/08/18 1127 02/09/18 1130      Assessment/Plan: POD 3 laparoscopic LOA for adhesive SBO- Brooke Cox -has return of bowel function with normal exam -fulls, adat VTE- scds, lovenox Dispo- likely home Monday if continues to do well    Brooke Cox 02/18/2018

## 2018-02-18 NOTE — Progress Notes (Signed)
Patient ID: Brooke Cox, female   DOB: Jan 18, 1941, 77 y.o.   MRN: [id number]  PROGRESS NOTE    Brooke Cox  ION:[id number] DOB: 11-Jan-1941 DOA: 02/08/2018 PCP: Brooke Chandler, NP   Brief Narrative: 77 year old female with history of hypertension, CHF, diverticulosis and GERD presented on 02/08/2018 with complaints of severe abdominal pain with nausea and vomiting.  CT of the abdomen and pelvis showed small bowel thickening and inflammatory change in the right lower quadrant and element of partial small bowel obstruction and dilated proximal small bowel loops, ? infectious versus inflammatory versus ischemia.  General surgery and GI were consulted.   Assessment & Plan:   Principal Problem:   Ileitis, terminal ([place]) Active Problems:   Benign hypertensive heart disease without heart failure   Hyperlipidemia with target LDL less than 130   Sigmoid diverticulosis   Obesity (BMI 30.0-34.9)   Partial small bowel obstruction (HCC)   Partial small bowel obstruction in the setting of infectious versus inflammatory process -Patient was empirically treated with Solu-Medrol for 3 days for question of intermittent bowel disease and had no response and was discontinued. -Patient was also on Zosyn which was discontinued on 02/16/2018 -Status post lap scopic diagnostic and adhesiolysis on 02/15/2018 by general surgery.  NG tube has already been removed.  Patient is tolerating diet which is being advanced as per general surgery -Continue ambulation and incentive spirometry  Leukocytosis -Probably reactive.  Improving  chronic diastolic dysfunction -No signs of fluid overload -Strict input output.  Daily weights  Essential hypertension -Monitor blood pressure.  Will resume home antihypertensive on discharge  hepatic steatosis -Incidental finding on CT abdomen/pelvis, also 2 lesions likely consistent with hemangioma, recommend six-month follow-up with MRI as outpatient  Endometrial  thickening -Incidental finding on CT abdomen and pelvis, recommend outpatient ultrasound and gynecology evaluation as an outpatient.    DVT prophylaxis: Lovenox Code Status: DNR Family Communication: None at bedside Disposition Plan: Probable discharge in a.m. if clinically stable  Consultants: General surgery/GI  Procedures: None  Antimicrobials: Unasyn on 01/22/2018-02/09/2018   Zosyn from 02/09/2018 -02/16/2018  Subjective: Patient seen and examined at bedside.  Denies any overnight fever, nausea or vomiting.  Having bowel movement.   Objective: Vitals:   02/17/18 1403 02/17/18 2105 02/18/18 0429 02/18/18 0900  BP: 131/78 (!) 143/72 136/84 136/86  Pulse: (!) 124 (!) 102 (!) 119   Resp: (!) 22 18 18    Temp: 98.1 F (36.7 C) 98.3 F (36.8 C) 98.7 F (37.1 C)   TempSrc:  Oral Oral   SpO2: 98% 98% 100%   Weight:   67.7 kg (149 lb 3.2 oz)   Height:        Intake/Output Summary (Last 24 hours) at 02/18/2018 1058 Last data filed at 02/18/2018 0938 Gross per 24 hour  Intake 1100 ml  Output 0 ml  Net 1100 ml   Filed Weights   02/16/18 0500 02/17/18 0549 02/18/18 0429  Weight: 68.2 kg (150 lb 5.7 oz) 70 kg (154 lb 5.2 oz) 67.7 kg (149 lb 3.2 oz)    Examination:  General exam: No acute distress.   Respiratory system: Bilateral decreased breath sound at bases  Cardiovascular system: S1-S2 positive, rate controlled      Data Reviewed: I have personally reviewed following labs and imaging studies  CBC: Recent Labs  Lab 02/13/18 0419 02/14/18 0441 02/15/18 0439 02/16/18 0422 02/17/18 0459 02/18/18 0344  WBC 13.5* 15.8* 11.1* 13.2* 16.6* 13.3*  NEUTROABS 12.3* 14.5* 8.7* 11.0*  --  10.0*  HGB 12.5 12.4 13.4 12.2 12.1 11.6*  HCT 36.1 36.4 38.8 36.7 36.1 34.9*  MCV 89.1 90.8 90.0 92.4 91.6 92.6  PLT 243 320 318 317 295 941   Basic Metabolic Panel: Recent Labs  Lab 02/14/18 0441 02/15/18 0439 02/16/18 0422 02/17/18 0459 02/18/18 0344  NA 134* 136 137 137  134*  K 4.4 3.9 3.8 4.1 4.2  CL 99* 99* 102 103 105  CO2 28 28 28 27 24   GLUCOSE 121* 104* 112* 110* 109*  BUN 20 20 19 19 14   CREATININE 0.66 0.76 0.81 0.64 0.61  CALCIUM 9.7 9.9 9.6 9.5 9.2  MG 2.2 2.3 2.2 2.2 2.0   GFR: Estimated Creatinine Clearance: 47.7 mL/min (by C-G formula based on SCr of 0.61 mg/dL). Liver Function Tests: Recent Labs  Lab 02/12/18 0417  AST 55*  ALT 41  ALKPHOS 61  BILITOT 0.8  PROT 5.9*  ALBUMIN 2.9*   No results for input(s): LIPASE, AMYLASE in the last 168 hours. No results for input(s): AMMONIA in the last 168 hours. Coagulation Profile: No results for input(s): INR, PROTIME in the last 168 hours. Cardiac Enzymes: No results for input(s): CKTOTAL, CKMB, CKMBINDEX, TROPONINI in the last 168 hours. BNP (last 3 results) No results for input(s): PROBNP in the last 8760 hours. HbA1C: No results for input(s): HGBA1C in the last 72 hours. CBG: No results for input(s): GLUCAP in the last 168 hours. Lipid Profile: No results for input(s): CHOL, HDL, LDLCALC, TRIG, CHOLHDL, LDLDIRECT in the last 72 hours. Thyroid Function Tests: No results for input(s): TSH, T4TOTAL, FREET4, T3FREE, THYROIDAB in the last 72 hours. Anemia Panel: No results for input(s): VITAMINB12, FOLATE, FERRITIN, TIBC, IRON, RETICCTPCT in the last 72 hours. Sepsis Labs: No results for input(s): PROCALCITON, LATICACIDVEN in the last 168 hours.  Recent Results (from the past 240 hour(s))  Surgical pcr screen     Status: None   Collection Time: 02/14/18  5:28 PM  Result Value Ref Range Status   MRSA, PCR NEGATIVE NEGATIVE Final   Staphylococcus aureus NEGATIVE NEGATIVE Final    Comment: (NOTE) The Xpert SA Assay (FDA approved for NASAL specimens in patients 64 years of age and older), is one component of a comprehensive surveillance program. It is not intended to diagnose infection nor to guide or monitor treatment. Performed at [facility], [place]  [address], [place], [place] [address]          Radiology Studies: No results found.      Scheduled Meds: . bisacodyl  10 mg Rectal Daily  . enoxaparin (LOVENOX) injection  40 mg Subcutaneous Q24H  . lip balm  1 application Topical BID  . metoprolol tartrate  10 mg Intravenous Q6H   Continuous Infusions: . dextrose 5 % and 0.45 % NaCl with KCl 40 mEq/L 50 mL/hr at 02/17/18 1805  . methocarbamol (ROBAXIN)  IV    . ondansetron (ZOFRAN) IV       LOS: 10 days        Aline August, MD [facility] Pager [phone]  If 7PM-7AM, please contact night-coverage [email or web address] Password [email or web address] 02/18/2018, 10:58 AM

## 2018-02-19 DIAGNOSIS — K56609 Unspecified intestinal obstruction, unspecified as to partial versus complete obstruction: Secondary | ICD-10-CM

## 2018-02-19 MED ORDER — ATENOLOL 25 MG PO TABS
25.0000 mg | ORAL_TABLET | Freq: Every day | ORAL | Status: DC
Start: 2018-02-19 — End: 2018-02-19

## 2018-02-19 MED ORDER — TRAMADOL HCL 50 MG PO TABS: 50.0000 mg | ORAL_TABLET | Freq: Four times a day (QID) | ORAL | 0 refills | Status: DC | PRN

## 2018-02-19 MED ORDER — PANTOPRAZOLE SODIUM 40 MG PO TBEC
40.0000 mg | DELAYED_RELEASE_TABLET | Freq: Every day | ORAL | Status: DC
  Administered 2018-02-19: 40 mg via ORAL
  Filled 2018-02-19: qty 1

## 2018-02-19 MED ORDER — ONDANSETRON HCL 4 MG/2ML IJ SOLN: 4.0000 mg | Freq: Four times a day (QID) | INTRAMUSCULAR | 0 refills | Status: DC | PRN

## 2018-02-19 NOTE — Discharge Instructions (Signed)
Please arrive at least 30 min before your appointment to complete your check in paperwork.  If you are unable to arrive 30 min prior to your appointment time we may have to cancel or reschedule you.  LAPAROSCOPIC SURGERY: POST OP INSTRUCTIONS  1. DIET: Follow a light bland diet the first 24 hours after arrival home, such as soup, liquids, crackers, etc. Be sure to include lots of fluids daily. Avoid fast food or heavy meals as your are more likely to get nauseated. Eat a low fat the next few days after surgery.  2. Take your usually prescribed home medications unless otherwise directed. 3. PAIN CONTROL:  1. Pain is best controlled by a usual combination of three different methods TOGETHER:  1. Ice/Heat 2. Over the counter pain medication 3. Prescription pain medication 2. Most patients will experience some swelling and bruising around the incisions. Ice packs or heating pads (30-60 minutes up to 6 times a day) will help. Use ice for the first few days to help decrease swelling and bruising, then switch to heat to help relax tight/sore spots and speed recovery. Some people prefer to use ice alone, heat alone, alternating between ice & heat. Experiment to what works for you. Swelling and bruising can take several weeks to resolve.  3. It is helpful to take an over-the-counter pain medication regularly for the first few weeks. Choose one of the following that works best for you:  1. Naproxen (Aleve, etc) Two 235m tabs twice a day 2. Ibuprofen (Advil, etc) Three 2048mtabs four times a day (every meal & bedtime) 3. Acetaminophen (Tylenol, etc) 500-6505mour times a day (every meal & bedtime) 4. A prescription for pain medication (such as oxycodone, hydrocodone, etc) should be given to you upon discharge. Take your pain medication as prescribed.  1. If you are having problems/concerns with the prescription medicine (does not control pain, nausea, vomiting, rash, itching, etc), please call us [place])  [phone] to see if we need to switch you to a different pain medicine that will work better for you and/or control your side effect better. 2. If you need a refill on your pain medication, please contact your pharmacy. They will contact our office to request authorization. Prescriptions will not be filled after 5 pm or on week-ends. 4. Avoid getting constipated. Between the surgery and the pain medications, it is common to experience some constipation. Increasing fluid intake and taking a fiber supplement (such as Metamucil, Citrucel, FiberCon, MiraLax, etc) 1-2 times a day regularly will usually help prevent this problem from occurring. A mild laxative (prune juice, Milk of Magnesia, MiraLax, etc) should be taken according to package directions if there are no bowel movements after 48 hours.  5. Watch out for diarrhea. If you have many loose bowel movements, simplify your diet to bland foods & liquids for a few days. Stop any stool softeners and decrease your fiber supplement. Switching to mild anti-diarrheal medications (Kayopectate, Pepto Bismol) can help. If this worsens or does not improve, please call us.[email or web address]. Wash / shower every day. You may shower over the dressings as they are waterproof. Continue to shower over incision(s) after the dressing is off. If there is glue over the incisions try not to pick it off, let it fall off naturally. 7. Remove your waterproof bandages 2 days after surgery. You may leave the incision open to air. You may replace a dressing/Band-Aid to cover the incision for comfort if you wish.  8. ACTIVITIES as tolerated:  1. You may resume regular (light) daily activities beginning the next day--such as daily self-care, walking, climbing stairs--gradually increasing activities as tolerated. If you can walk 30 minutes without difficulty, it is safe to try more intense activity such as jogging, treadmill, bicycling, low-impact aerobics, swimming, etc. 2. Save the most intensive and  strenuous activity for last such as sit-ups, heavy lifting, contact sports, etc Refrain from any heavy lifting or straining until you are off narcotics for pain control. For the first 2-3 weeks do not lift over 10-15lb.  3. DO NOT PUSH THROUGH PAIN. Let pain be your guide: If it hurts to do something, don't do it. Pain is your body warning you to avoid that activity for another week until the pain goes down. 4. You may drive when you are no longer taking prescription pain medication, you can comfortably wear a seatbelt, and you can safely maneuver your car and apply brakes. 5. You may have sexual intercourse when it is comfortable.  9. FOLLOW UP in our office  1. Please call [facility] at [phone] to set up an appointment to see your surgeon in the office for a follow-up appointment approximately 2-3 weeks after your surgery. 2. Make sure that you call for this appointment the day you arrive home to insure a convenient appointment time.      10. IF YOU HAVE DISABILITY OR FAMILY LEAVE FORMS, BRING THEM TO THE               OFFICE FOR PROCESSING.   WHEN TO CALL us [phone]:  1. Poor pain control 2. Reactions / problems with new medications (rash/itching, nausea, etc)  3. Fever over 101.5 F (38.5 C) 4. Inability to urinate 5. Nausea and/or vomiting 6. Worsening swelling or bruising 7. Continued bleeding from incision. 8. Increased pain, redness, or drainage from the incision  The clinic staff is available to answer your questions during regular business hours (8:30am-5pm). Please dont hesitate to call and ask to speak to one of our nurses for clinical concerns.  If you have a medical emergency, go to the nearest emergency room or call 911.  A surgeon from [facility] is always on call at the [facility], [place], [place], [place], [place] [address] ?  MAIN: [phone] ? TOLL FREE: [phone] ?  FAX [phone]    [email or web address]

## 2018-02-19 NOTE — Progress Notes (Signed)
[facility] Progress Note  4 Days Post-Op  Subjective: CC- SBO Sitting up in bed, states that she did well over the weekend. Abdomen a little sore but overall she has little pain. Not taking any pain medication. Denies n/v. Tolerating diet. Passing flatus and had a BM this morning. BMs still loose but starting to bulk up. She is having some acid reflux. Takes prilosec at home which she has not gotten during this admission.  Objective: Vital signs in last 24 hours: Temp:  [98 F (36.7 C)-98.7 F (37.1 C)] 98.4 F (36.9 C) (06/10 0616) Pulse Rate:  [89-109] 89 (06/10 0616) Resp:  [12-14] 14 (06/10 0616) BP: (136-155)/(72-103) 144/103 (06/10 0616) SpO2:  [97 %-100 %] 99 % (06/10 0616) Weight:  [153 lb 7 oz (69.6 kg)] 153 lb 7 oz (69.6 kg) (06/10 0616) Last BM Date: 02/18/18  Intake/Output from previous day: 06/09 0701 - 06/10 0700 In: 1110.8 [P.O.:700; I.V.:410.8] Out: 0  Intake/Output this shift: No intake/output data recorded.  PE: Gen: Alert, NAD, pleasant HEENT: EOM's intact, pupils equal and round Card: RRR, no M/G/R heard Pulm: CTAB, no W/R/R, effort normal Abd: Soft,ND,nontender,+BS in all 4 quadrants DXA:JOINOM soft and nontender Skin: no rashes noted, warm and dry   Lab Results:  Recent Labs    02/17/18 0459 02/18/18 0344  WBC 16.6* 13.3*  HGB 12.1 11.6*  HCT 36.1 34.9*  PLT 295 283   BMET Recent Labs    02/17/18 0459 02/18/18 0344  NA 137 134*  K 4.1 4.2  CL 103 105  CO2 27 24  GLUCOSE 110* 109*  BUN 19 14  CREATININE 0.64 0.61  CALCIUM 9.5 9.2   PT/INR No results for input(s): LABPROT, INR in the last 72 hours. CMP     Component Value Date/Time   NA 134 (L) 02/18/2018 0344   NA 140 07/30/2015 0950   K 4.2 02/18/2018 0344   CL 105 02/18/2018 0344   CO2 24 02/18/2018 0344   GLUCOSE 109 (H) 02/18/2018 0344   BUN 14 02/18/2018 0344   BUN 13 07/30/2015 0950   CREATININE 0.61 02/18/2018 0344   CREATININE 0.66  02/06/2018 0904   CALCIUM 9.2 02/18/2018 0344   PROT 5.9 (L) 02/12/2018 0417   PROT 6.3 07/30/2015 0950   ALBUMIN 2.9 (L) 02/12/2018 0417   ALBUMIN 4.1 07/30/2015 0950   AST 55 (H) 02/12/2018 0417   ALT 41 02/12/2018 0417   ALKPHOS 61 02/12/2018 0417   BILITOT 0.8 02/12/2018 0417   BILITOT 0.5 07/30/2015 0950   GFRNONAA >60 02/18/2018 0344   GFRNONAA 84 02/06/2018 0904   GFRAA >60 02/18/2018 0344   GFRAA 97 02/06/2018 0904   Lipase     Component Value Date/Time   LIPASE 22 02/08/2018 0500       Studies/Results: No results found.  Anti-infectives: Anti-infectives (From admission, onward)   Start     Dose/Rate Route Frequency Ordered Stop   02/15/18 0934  sodium chloride 0.9 % with cefoTEtan (CEFOTAN) ADS Med    Note to Pharmacy:  Kyra Leyland   : cabinet override      02/15/18 0934 02/15/18 2144   02/15/18 0915  cefoTEtan (CEFOTAN) 2 g in sodium chloride 0.9 % 100 mL IVPB  Status:  Discontinued     2 g 200 mL/hr over 30 Minutes Intravenous On call to O.R. 02/15/18 0910 02/15/18 1230   02/09/18 1600  piperacillin-tazobactam (ZOSYN) IVPB 3.375 g  Status:  Discontinued     3.375  g 12.5 mL/hr over 240 Minutes Intravenous Every 8 hours 02/09/18 1145 02/16/18 0830   02/08/18 1200  Ampicillin-Sulbactam (UNASYN) 3 g in sodium chloride 0.9 % 100 mL IVPB  Status:  Discontinued     3 g 200 mL/hr over 30 Minutes Intravenous Every 6 hours 02/08/18 1127 02/09/18 1130       Assessment/Plan HTN HLD GERD Chronic diastolic dysfunction Hiatal hernia  Partial SBO S/p LAPAROSCOPY DIAGNOSTIC& ADHESIOLYSIS 6/6 Dr. Rosendo Gros - POD 4 - tolerating diet and having bowel function  ID -unasyn 5/30>>5/31, zosyn 5/31>>6/7 FEN - HH diet VTE -SCDs, lovenox Foley - none  Plan -Patient is stable from surgical standpoint. She does not need a rx for narcotic pain medications. Follow up info and discharge instructions on AVS.   LOS: 11 days    Wellington Hampshire , [facility] 02/19/2018, 8:10 AM Pager: [phone] Consults: [phone] Mon 7:00 am -11:30 AM Tues-Fri 7:00 am-4:30 pm Sat-Sun 7:00 am-11:30 am

## 2018-02-19 NOTE — Discharge Summary (Signed)
Physician Discharge Summary  Brooke Cox SL[facility]:[id number] DOB: 13-Aug-1941 DOA: 02/08/2018  PCP: [name], NP  Admit date: 02/08/2018 Discharge date: 02/19/2018  Admitted From: Home Disposition: Home  Recommendations for Outpatient Follow-up:  1. Follow up with PCP in 1 week 2. Follow-up with general surgery as an outpatient.     Home Health: No Equipment/Devices: None  Discharge Condition: Stable CODE STATUS: DNR Diet recommendation: Heart Healthy   Brief/Interim Summary: 77 year old female with history of hypertension, CHF, diverticulosis and GERD presented on 02/08/2018 with complaints of severe abdominal pain with nausea and vomiting.  CT of the abdomen and pelvis showed small bowel thickening and inflammatory change in the right lower quadrant and element of partial small bowel obstruction and dilated proximal small bowel loops, ? infectious versus inflammatory versus ischemia.  General surgery and GI were consulted.  Patient was initially treated empirically with intravenous Solu-Medrol and Zosyn.  Condition did not improve.  Patient underwent laparoscopic diagnostic and adhesiolysis on 02/15/2018 by general surgery.  Gradually her symptoms improved, bowel function returned.  She is tolerated advanced diet.  General surgery has cleared the patient for discharge.   Discharge Diagnoses:  Principal Problem:   SBO (small bowel obstruction) s/p lap adhesiolysis 02/15/2018 Active Problems:   Benign hypertensive heart disease without heart failure   Hyperlipidemia with target LDL less than 130   Sigmoid diverticulosis   Obesity (BMI 30.0-34.9)   Ileitis, terminal (HCC)  Partial small bowel obstruction in the setting of infectious versus inflammatory process -Patient was empirically treated with Solu-Medrol for 3 days for question of intermittent bowel disease and had no response and was discontinued. -Patient was also on Zosyn which was discontinued on 02/16/2018 -Status post  laparoscopic diagnostic and adhesiolysis on 02/15/2018 by general surgery.  NG tube has already been removed.  Patient is tolerating diet which is being advanced as per general surgery -Ambulating well and having bowel movements.  General surgery has cleared the patient for discharge.  Outpatient follow-up with general surgery. -Discharge patient home today.  Leukocytosis -Probably reactive.  Improving  chronic diastolic dysfunction -No signs of fluid overload -Outpatient follow-up  Essential hypertension -Monitor blood pressure.    Resume atenolol on discharge  hepatic steatosis -Incidental finding on CT abdomen/pelvis, also 2 lesions likely consistent with hemangioma, recommend six-month follow-up with MRI as outpatient  Endometrial thickening -Incidental finding on CT abdomen and pelvis, recommend outpatient ultrasound and gynecology evaluation as an outpatient.    Discharge Instructions  Discharge Instructions    Call MD for:  difficulty breathing, headache or visual disturbances   Complete by:  As directed    Call MD for:  extreme fatigue   Complete by:  As directed    Call MD for:  hives   Complete by:  As directed    Call MD for:  persistant dizziness or light-headedness   Complete by:  As directed    Call MD for:  persistant nausea and vomiting   Complete by:  As directed    Call MD for:  redness, tenderness, or signs of infection (pain, swelling, redness, odor or green/yellow discharge around incision site)   Complete by:  As directed    Call MD for:  severe uncontrolled pain   Complete by:  As directed    Call MD for:  temperature >100.4   Complete by:  As directed    Diet - low sodium heart healthy   Complete by:  As directed    Increase activity slowly  Complete by:  As directed      Allergies as of 02/19/2018      Reactions   Shellfish Allergy Nausea And Vomiting      Medication List    STOP taking these medications   aspirin 81 MG tablet      TAKE these medications   acetaminophen 325 MG tablet Commonly known as:  TYLENOL Take 325 mg by mouth every 6 (six) hours as needed for moderate pain.   atenolol 25 MG tablet Commonly known as:  TENORMIN Take 1 tablet (25 mg total) by mouth at bedtime.   omeprazole 20 MG capsule Commonly known as:  PRILOSEC Take 1 capsule (20 mg total) by mouth daily.   ondansetron 4 MG/2ML Soln injection Commonly known as:  ZOFRAN Inject 2 mLs (4 mg total) into the vein every 6 (six) hours as needed for nausea or vomiting (Use Zofran 1st).   simvastatin 40 MG tablet Commonly known as:  ZOCOR TAKE ONE TABLET BY MOUTH ONCE DAILY AT 6 PM.   traMADol 50 MG tablet Commonly known as:  ULTRAM Take 1 tablet (50 mg total) by mouth every 6 (six) hours as needed for severe pain.   Vitamin D 2000 units tablet Take 2,000 Units by mouth daily.      Follow-up Information    Ralene Ok, MD. Go on 03/06/2018.   Specialty:  General Surgery Why:  Your appointment is 03/06/2018 at 11:40 am. Please arrive 30 minutes prior to your appointment to check in and fill out paperwork. Bring photo ID and insurance information. Contact information: [address] [place] [address] [phone]        [name], NP. Schedule an appointment as soon as possible for a visit in 1 week(s).   Specialty:  Geriatric Medicine Contact information: [address] [place] [address] [phone]          Allergies  Allergen Reactions  . Shellfish Allergy Nausea And Vomiting    Consultations:  General surgery/GI   Procedures/Studies: Dg Chest 2 View  Result Date: 02/08/2018 CLINICAL DATA:  Short of breath.  Weakness.  Cough. EXAM: CHEST - 2 VIEW COMPARISON:  None. FINDINGS: Cardiac silhouette is top-normal in size. Small to moderate hiatal hernia. No mediastinal or hilar masses. No convincing adenopathy. Clear lungs.  No pleural effusion or pneumothorax. Skeletal structures  are intact. IMPRESSION: No active cardiopulmonary disease. Electronically Signed   By: Lajean Manes M.D.   On: 02/08/2018 17:15   Dg Abd 1 View  Result Date: 02/14/2018 CLINICAL DATA:  Bowel obstruction EXAM: ABDOMEN - 1 VIEW COMPARISON:  February 13, 2018 FINDINGS: Nasogastric tube tip and side port are in the duodenum, stable. There remain loops of dilated bowel, slightly less than on 1 day prior. No free air. Surgical clips noted in right upper quadrant. Lung bases are clear. IMPRESSION: Nasogastric tube position unchanged. Less bowel dilatation compared to 1 day prior. No air-fluid levels evident. No free air. Lung bases clear. Electronically Signed   By: Lowella Grip III M.D.   On: 02/14/2018 07:34   Dg Abd 1 View  Result Date: 02/13/2018 CLINICAL DATA:  Small bowel obstruction EXAM: ABDOMEN - 1 VIEW COMPARISON:  February 12, 2018 FINDINGS: Nasogastric tube tip and side port are in the proximal duodenum. There remain loops of dilated small bowel in a pattern felt to represent persistent obstruction. No free air. There are surgical clips in the gallbladder fossa region. IMPRESSION: Evidence of a  degree of bowel obstruction, similar to 1 day prior. Stable nasogastric tube positioning. No evident free air. Electronically Signed   By: Lowella Grip III M.D.   On: 02/13/2018 10:44   Ct Abdomen Pelvis W Contrast  Result Date: 02/14/2018 CLINICAL DATA:  Abdominal pain and bloating. Follow-up small bowel obstruction. EXAM: CT ABDOMEN AND PELVIS WITH CONTRAST TECHNIQUE: Multidetector CT imaging of the abdomen and pelvis was performed using the standard protocol following bolus administration of intravenous contrast. CONTRAST:  143m ISOVUE-300 IOPAMIDOL (ISOVUE-300) INJECTION 61% COMPARISON:  02/08/2018 FINDINGS: Lower chest: New small bilateral pleural effusions and mild right lower lobe atelectasis. Hepatobiliary: 2.7 cm benign hemangioma is seen in segment 4B adjacent to the porta hepatis. A 1 cm  flash-filling hemangioma is seen in segment 7. These are stable since previous study. No new or enlarging liver lesions are identified. Prior cholecystectomy. No evidence of biliary obstruction. Pancreas:  No mass or inflammatory changes. Spleen:  Within normal limits in size and appearance. Adrenals/Urinary Tract: No masses identified. No evidence of hydronephrosis. Stomach/Bowel: A large hiatal hernia is again seen. A nasogastric tube is now seen with tip in the proximal duodenum. There is persistent moderate dilatation of small bowel loops, with transition point in the anterior right lower quadrant. No mass or inflammatory process seen in this region. This is suspicious for adhesion. Distal small bowel and colon are nondistended. Vascular/Lymphatic: No pathologically enlarged lymph nodes identified. No abdominal aortic aneurysm. Aortic atherosclerosis. Reproductive: Small postmenopausal uterus is again seen, with diffuse thickening of the endometrium measuring approximately 15 mm. No evidence of extra uterine extension. No adnexal mass identified. Tiny amount of free fluid seen in the dependent pelvis. Other:  None. Musculoskeletal:  No suspicious bone lesions identified. IMPRESSION: Persistent partial small bowel obstruction, with transition point in the anterior right lower quadrant, likely due to adhesion. New small bilateral pleural effusions and minimal ascites in pelvis. Large hiatal hernia.  Nasogastric tube tip in proximal duodenum. Persistent abnormal endometrial thickening. Endometrial carcinoma cannot be excluded in a postmenopausal female. Recommend GYN consultation for endometrial sampling. Small benign hepatic hemangiomas. Electronically Signed   By: JEarle GellM.D.   On: 02/14/2018 13:05   Dg Abd 2 Views  Result Date: 02/10/2018 CLINICAL DATA:  Vomiting EXAM: ABDOMEN - 2 VIEW COMPARISON:  CT abdomen/pelvis from 02/08/2018 FINDINGS: Multiple dilated small bowel loops with air-fluid levels  throughout the abdomen, most prominent in the left abdomen measuring up to 4.3 cm diameter, which appears slightly worsened since the 02/08/2018 CT abdomen scout topogram. Mild colonic stool. No evidence of pneumatosis or pneumoperitoneum. Large hiatal hernia. Stable patchy bibasilar lung opacities. Cholecystectomy clips are seen in the right upper quadrant of the abdomen. IMPRESSION: Multiple dilated small bowel loops with air-fluid levels throughout the abdomen, appearing slightly worsened since 02/08/2018 CT abdomen scout topogram, suggesting worsening distal small bowel obstruction. No evidence of pneumatosis or pneumoperitoneum. Large hiatal hernia with bibasilar lung opacities, favor atelectasis. Electronically Signed   By: JIlona SorrelM.D.   On: 02/10/2018 19:48   Dg Abd Portable 1v  Result Date: 02/15/2018 CLINICAL DATA:  Small-bowel obstruction, follow-up, persistent abdominal discomfort and distension EXAM: PORTABLE ABDOMEN - 1 VIEW COMPARISON:  Portable exam 0717 hours compared to 02/14/2018 FINDINGS: Question small pleural effusion atelectasis at RIGHT lung base. Tip of nasogastric tube projects over RIGHT mid abdomen at the distal second portion of the duodenum. Persistent small bowel dilatation. No bowel wall thickening. Paucity of colonic gas. Excreted contrast within bladder. Surgical  clips RIGHT upper quadrant from cholecystectomy. Bones demineralized with degenerative disc and facet disease changes of lower lumbar spine. IMPRESSION: Persistent small bowel dilatation with paucity of colonic gas consistent with small bowel obstruction. Electronically Signed   By: Lavonia Dana M.D.   On: 02/15/2018 08:08   Dg Abd Portable 1v-small Bowel Obstruction Protocol-initial, 8 Hr Delay  Result Date: 02/13/2018 CLINICAL DATA:  Small bowel obstruction.  8 hour delayed film. EXAM: PORTABLE ABDOMEN - 1 VIEW COMPARISON:  Abdominal radiograph yesterday.  CT 02/08/2018 FINDINGS: Administered enteric contrast is  seen within dilated small bowel in the central abdomen. Enteric contrast has not yet reached the colon. Enteric tube tip in side-port below the diaphragm in the stomach in the right upper abdomen. IMPRESSION: Administered enteric contrast remains within dilated small bowel in the central lower abdomen and has not reached the colon. Recommend 24 hour delayed film. Electronically Signed   By: Jeb Levering M.D.   On: 02/13/2018 00:13   Dg Abd Portable 1v  Result Date: 02/11/2018 CLINICAL DATA:  Repositioning of the indwelling nasogastric tube at the patient's bedside. EXAM: PORTABLE ABDOMEN - 1 VIEW 1:15 p.m.: COMPARISON:  Portable abdomen x-ray earlier today at 12:16 p.m. Abdomen x-ray 02/10/2018. CT abdomen and pelvis 02/08/2018. FINDINGS: The nasogastric tube is looped in the patient's large hiatal hernia, still positioned above the diaphragm. Dilated loops of small bowel in the LEFT UPPER quadrant of the abdomen persist. Atelectasis in the lung bases is again noted. IMPRESSION: The nasogastric tube is looped in the patient's large hiatal hernia, still positioned in the intrathoracic portion of the stomach. Electronically Signed   By: Evangeline Dakin M.D.   On: 02/11/2018 13:45   Dg Abd Portable 1v-small Bowel Protocol-position Verification  Result Date: 02/11/2018 CLINICAL DATA:  Nasogastric tube placement. EXAM: PORTABLE ABDOMEN - 1 VIEW COMPARISON:  Abdominal plain film dated 02/10/2018. FINDINGS: NG tube tip is at the level of the gastroesophageal junction. Proximal side holes of the tube are at the level of the mid/lower esophagus. Mildly distended gas-filled loops of small bowel are partially imaged within the LEFT upper quadrant, likely stable compared to the earlier exam. IMPRESSION: Nasogastric tube tip is at the level of the gastroesophageal junction. Recommend advancing approximately 10 cm for optimal radiographic positioning Electronically Signed   By: Franki Cabot M.D.   On: 02/11/2018 12:45    Dg Intro Long Gi Tube  Result Date: 02/12/2018 CLINICAL DATA:  NG tube placement for small bowel obstruction. EXAM: INTRO LONG GI TUBE CONTRAST:  30 cc of Isovue-300 FLUOROSCOPY TIME:  Fluoroscopy Time:  2 minutes 45 seconds Radiation Exposure Index (if provided by the fluoroscopic device): 33.18 Number of Acquired Spot Images: 1 COMPARISON:  None. FINDINGS: Under fluoroscopic guidance the nasogastric tube was placed past the hiatal hernia and into the distal stomach. Contrast injection of the nasogastric tube confirms placement. IMPRESSION: Technically successful image guided placement of nasogastric tube with tip in the distal stomach. Electronically Signed   By: Kerby Moors M.D.   On: 02/12/2018 14:41   Ct Angio Abd/pel W And/or Wo Contrast  Result Date: 02/08/2018 EXAM: CTA ABDOMEN AND PELVIS wITHOUT AND WITH CONTRAST TECHNIQUE: Multidetector CT imaging of the abdomen and pelvis was performed using the standard protocol during bolus administration of intravenous contrast. Multiplanar reconstructed images and MIPs were obtained and reviewed to evaluate the vascular anatomy. CONTRAST:  187m ISOVUE-370 IOPAMIDOL (ISOVUE-370) INJECTION 76% COMPARISON:  None. FINDINGS: VASCULAR Aorta: Nonaneurysmal and patent with scattered atherosclerotic calcification.  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 Branches reconstitute via pancreatic collateral vessels. Branch vessels are patent. No obvious visceral artery aneurysm within the branches. SMA: Patent.  Replaced right hepatic artery anatomy. Renals: Single renal arteries are patent. IMA: Patent and diminutive. Inflow: Bilateral common iliac, external iliac, and internal iliac arteries are patent. Mild atherosclerotic calcifications are noted. Proximal Outflow: Grossly patent. Veins: No evidence of DVT. Review of the MIP images confirms the above findings. NON-VASCULAR Lower chest: Bibasilar dependent atelectasis  versus consolidation right greater than left. Hepatobiliary: Postcholecystectomy. Diffuse hepatic steatosis. There is a 3.1 cm low-density lesion within the medial segment of the left lobe of the liver centrally. It is characterized by peripheral discontinuous puddling on venous phase images. This is most consistent with a benign hemangioma. There is an avidly enhancing lesion in the right lobe of the liver measuring 11 mm on image 33 which is nonspecific but may represent a hemangioma. Pancreas: Unremarkable Spleen: Unremarkable Adrenals/Urinary Tract: Adrenal glands are within normal limits. Mild cortical atrophy of the kidneys. Decompressed bladder. Stomach/Bowel: Post appendectomy staples are noted. No obvious mass in the colon. There is a segment of distal small bowel in the right lower quadrant with wall thickening and inflammatory change in the adjacent fat. Mildly dilated small bowel loops with air-fluid levels are present proximal to this area. There is no pneumatosis or extraluminal bowel gas. There is a small amount of free fluid within the adjacent right paracolic gutter. Large hiatal hernia is present. Duodenum is unremarkable. Sigmoid diverticulosis. Lymphatic: No abnormal retroperitoneal adenopathy. Reproductive: The endometrial stripe in the fundus of the uterus is markedly thickened measuring 15 mm. Adnexa are unremarkable. Other: Small amount of free fluid layers in the pelvis. Musculoskeletal: No vertebral compression. Degenerative change in the lower lumbar spine. 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 Branches reconstitute via pancreatic collaterals from the SMA. SMA and IMA are patent. No acute vascular pathology. NON-VASCULAR There is a segment of small bowel wall thickening and inflammatory change in the right lower quadrant. This results in an element of partial small bowel obstruction and dilated proximal small bowel loops. Differential diagnosis includes infection,  inflammatory bowel disease, and ischemia. There is no obvious evidence of a internal hernia or torsion of small bowel. Large hiatal hernia. Thickening of the endometrial stripe of the uterus. Pathology cannot be excluded. Ultrasound is warranted. There are 2 lesions in the liver as described. They most likely represent benign entities such as hemangiomata. If there is a history of malignancy or high risk of malignancy, six-month follow-up MRI is recommended. Electronically Signed   By: Marybelle Killings M.D.   On: 02/08/2018 08:28    Laparoscopic diagnostic and adhesiolysis on 02/15/2018   Subjective: Patient seen and examined at bedside.  She feels better and wants to go home.  She is tolerating diet and having bowel movements.  Discharge Exam: Vitals:   02/18/18 2120 02/19/18 0616  BP: (!) 155/74 (!) 144/103  Pulse: (!) 109 89  Resp: 13 14  Temp: 98.7 F (37.1 C) 98.4 F (36.9 C)  SpO2: 100% 99%   Vitals:   02/18/18 0900 02/18/18 1330 02/18/18 2120 02/19/18 0616  BP: 136/86 (!) 141/72 (!) 155/74 (!) 144/103  Pulse:  97 (!) 109 89  Resp:  12 13 14   Temp:  98 F (36.7 C) 98.7 F (37.1 C) 98.4 F (36.9 C)  TempSrc:  Oral Oral Oral  SpO2:  97%  100% 99%  Weight:    69.6 kg (153 lb 7 oz)  Height:        General: Pt is alert, awake, not in acute distress Cardiovascular: Rate controlled, S1/S2 + Respiratory: Bilateral decreased breath sounds at bases Abdominal: Soft, NT, ND, bowel sounds + Extremities: no edema, no cyanosis    The results of significant diagnostics from this hospitalization (including imaging, microbiology, ancillary and laboratory) are listed below for reference.     Microbiology: Recent Results (from the past 240 hour(s))  Surgical pcr screen     Status: None   Collection Time: 02/14/18  5:28 PM  Result Value Ref Range Status   MRSA, PCR NEGATIVE NEGATIVE Final   Staphylococcus aureus NEGATIVE NEGATIVE Final    Comment: (NOTE) The Xpert SA Assay (FDA  approved for NASAL specimens in patients 65 years of age and older), is one component of a comprehensive surveillance program. It is not intended to diagnose infection nor to guide or monitor treatment. Performed at [facility], [place] [address], [place], [place] [address]      Labs: BNP (last 3 results) No results for input(s): BNP in the last 8760 hours. Basic Metabolic Panel: Recent Labs  Lab 02/14/18 0441 02/15/18 0439 02/16/18 0422 02/17/18 0459 02/18/18 0344  NA 134* 136 137 137 134*  K 4.4 3.9 3.8 4.1 4.2  CL 99* 99* 102 103 105  CO2 28 28 28 27 24   GLUCOSE 121* 104* 112* 110* 109*  BUN 20 20 19 19 14   CREATININE 0.66 0.76 0.81 0.64 0.61  CALCIUM 9.7 9.9 9.6 9.5 9.2  MG 2.2 2.3 2.2 2.2 2.0   Liver Function Tests: No results for input(s): AST, ALT, ALKPHOS, BILITOT, PROT, ALBUMIN in the last 168 hours. No results for input(s): LIPASE, AMYLASE in the last 168 hours. No results for input(s): AMMONIA in the last 168 hours. CBC: Recent Labs  Lab 02/13/18 0419 02/14/18 0441 02/15/18 0439 02/16/18 0422 02/17/18 0459 02/18/18 0344  WBC 13.5* 15.8* 11.1* 13.2* 16.6* 13.3*  NEUTROABS 12.3* 14.5* 8.7* 11.0*  --  10.0*  HGB 12.5 12.4 13.4 12.2 12.1 11.6*  HCT 36.1 36.4 38.8 36.7 36.1 34.9*  MCV 89.1 90.8 90.0 92.4 91.6 92.6  PLT 243 320 318 317 295 283   Cardiac Enzymes: No results for input(s): CKTOTAL, CKMB, CKMBINDEX, TROPONINI in the last 168 hours. BNP: Invalid input(s): POCBNP CBG: No results for input(s): GLUCAP in the last 168 hours. D-Dimer No results for input(s): DDIMER in the last 72 hours. Hgb A1c No results for input(s): HGBA1C in the last 72 hours. Lipid Profile No results for input(s): CHOL, HDL, LDLCALC, TRIG, CHOLHDL, LDLDIRECT in the last 72 hours. Thyroid function studies No results for input(s): TSH, T4TOTAL, T3FREE, THYROIDAB in the last 72 hours.  Invalid input(s): FREET3 Anemia work up No results for input(s):  VITAMINB12, FOLATE, FERRITIN, TIBC, IRON, RETICCTPCT in the last 72 hours. Urinalysis    Component Value Date/Time   COLORURINE YELLOW 02/08/2018 0610   APPEARANCEUR HAZY (A) 02/08/2018 0610   LABSPEC 1.017 02/08/2018 0610   PHURINE 7.0 02/08/2018 0610   GLUCOSEU 50 (A) 02/08/2018 0610   HGBUR NEGATIVE 02/08/2018 0610   BILIRUBINUR NEGATIVE 02/08/2018 0610   KETONESUR 20 (A) 02/08/2018 0610   PROTEINUR NEGATIVE 02/08/2018 0610   NITRITE NEGATIVE 02/08/2018 0610   LEUKOCYTESUR TRACE (A) 02/08/2018 0610   Sepsis Labs Invalid input(s): PROCALCITONIN,  WBC,  LACTICIDVEN Microbiology Recent Results (from the past 240 hour(s))  Surgical pcr screen     Status: None   Collection Time: 02/14/18  5:28 PM  Result Value Ref Range Status   MRSA, PCR NEGATIVE NEGATIVE Final   Staphylococcus aureus NEGATIVE NEGATIVE Final    Comment: (NOTE) The Xpert SA Assay (FDA approved for NASAL specimens in patients 26 years of age and older), is one component of a comprehensive surveillance program. It is not intended to diagnose infection nor to guide or monitor treatment. Performed at [facility], [place] [address] [place], [place] [address]      Time coordinating discharge: 35 minutes  SIGNED:   Aline August, MD  [facility] 02/19/2018, 9:07 AM Pager: [phone]  If 7PM-7AM, please contact night-coverage [email or web address] Password [email or web address]

## 2018-02-19 NOTE — Progress Notes (Signed)
Pt alert and oriented, tolerating diet.  D/C instructions were given.  All questions were answered. Pt was d/cd home with spouse.

## 2018-02-20 ENCOUNTER — Telehealth: Payer: Self-pay

## 2018-02-20 NOTE — Telephone Encounter (Signed)
Transition Care Management Follow-up Telephone Call   How have you been since you were released from the hospital? No more abdominal pain. No n/v. Surgical site is dry and clean.   Do you understand why you were in the hospital? yes  Do you have a copy of your discharge instructions Yes Do you understand the discharge instrcutions? yes  Where were you discharged to? Home  Do you have support at home? Yes    Items Reviewed:  Medications obtained Yes, has taken 1 tramadol for pain but states she will not need to take anymore  Medications reviewed: Yes  Dietary changes reviewed: yes  Home Health? No  DME ordered at discharge obtained? No  Medical supplies: NA,     Functional Questionnaire:   Activities of Daily Living (ADLs):   She states they are independent in the following: ambulation, bathing and hygiene, feeding, continence, grooming, toileting, dressing and medication management States they require assistance with the following: Driving  Any transportation issues/concerns?: no, husband can drive her for the next week while she is recovering from surgery  Any patient concerns? no  Confirmed importance and date/time of follow-up visits scheduled with PCP: yes, Sherrie Mustache, NP 02/26/2018 @ 8:30am  Confirm appointment scheduled with specialist? NA,  Confirmed with patient if condition begins to worsen call PCP or If it's emergency go to the ER.

## 2018-02-26 ENCOUNTER — Encounter: Payer: Self-pay | Admitting: Nurse Practitioner

## 2018-02-26 ENCOUNTER — Ambulatory Visit (INDEPENDENT_AMBULATORY_CARE_PROVIDER_SITE_OTHER): Payer: Medicare Other | Admitting: Nurse Practitioner

## 2018-02-26 VITALS — BP 124/72 | HR 67 | Temp 98.3°F | Ht 59.0 in | Wt 149.0 lb

## 2018-02-26 DIAGNOSIS — K56609 Unspecified intestinal obstruction, unspecified as to partial versus complete obstruction: Secondary | ICD-10-CM

## 2018-02-26 DIAGNOSIS — R9389 Abnormal findings on diagnostic imaging of other specified body structures: Secondary | ICD-10-CM

## 2018-02-26 DIAGNOSIS — K760 Fatty (change of) liver, not elsewhere classified: Secondary | ICD-10-CM

## 2018-02-26 LAB — CBC WITH DIFFERENTIAL/PLATELET
BASOS PCT: 0.3 %
Basophils Absolute: 38 cells/uL (ref 0–200)
EOS ABS: 166 {cells}/uL (ref 15–500)
EOS PCT: 1.3 %
HCT: 30.3 % — ABNORMAL LOW (ref 35.0–45.0)
HEMOGLOBIN: 10.6 g/dL — AB (ref 11.7–15.5)
Lymphs Abs: 1242 cells/uL (ref 850–3900)
MCH: 31.7 pg (ref 27.0–33.0)
MCHC: 35 g/dL (ref 32.0–36.0)
MCV: 90.7 fL (ref 80.0–100.0)
MONOS PCT: 5.9 %
MPV: 8.7 fL (ref 7.5–12.5)
NEUTROS ABS: 10598 {cells}/uL — AB (ref 1500–7800)
Neutrophils Relative %: 82.8 %
Platelets: 336 10*3/uL (ref 140–400)
RBC: 3.34 10*6/uL — AB (ref 3.80–5.10)
RDW: 12.9 % (ref 11.0–15.0)
Total Lymphocyte: 9.7 %
WBC mixed population: 755 cells/uL (ref 200–950)
WBC: 12.8 10*3/uL — ABNORMAL HIGH (ref 3.8–10.8)

## 2018-02-26 LAB — BASIC METABOLIC PANEL WITHOUT GFR
BUN: 7 mg/dL (ref 7–25)
CO2: 28 mmol/L (ref 20–32)
Calcium: 9.9 mg/dL (ref 8.6–10.4)
Chloride: 101 mmol/L (ref 98–110)
Creat: 0.62 mg/dL (ref 0.60–0.93)
GFR, Est African American: 99 mL/min/1.73m2 (ref >=60)
GFR, Est Non African American: 86 mL/min/1.73m2 (ref >=60)
Glucose, Bld: 81 mg/dL (ref 65–99)
Potassium: 3.9 mmol/L (ref 3.5–5.3)
Sodium: 137 mmol/L (ref 135–146)

## 2018-02-26 NOTE — Progress Notes (Signed)
Careteam: Patient Care Team: [name], NP as PCP - General (Geriatric Medicine)  Advanced Directive information Does Patient Have a Medical Advance Directive?: Yes, Type of Advance Directive: Out of facility DNR (pink MOST or yellow form), Pre-existing out of facility DNR order (yellow form or pink MOST form): Yellow form placed in chart (order not valid for inpatient use)  Allergies  Allergen Reactions  . Shellfish Allergy Nausea And Vomiting    Chief Complaint  Patient presents with  . Transitions Of Care    Pt is being seen due to recent admission to [facility] 5/30 to 6/10 for SBO. Pt reports that she still feels weak.      HPI: Patient is a 77 y.o. female seen in the office today for hospital follow up. history of hypertension, CHF, diverticulosis and GERD presented on 02/08/2018 to the ED with complaints of severe abdominal pain with nausea and vomiting. CT of the abdomen and pelvis showed small bowel thickening and inflammatory change in the right lower quadrant and element of partial small bowel obstruction and dilated proximal small bowel loops, ? infectious versus inflammatory versus ischemia. She was admitted and surgery and GI consulted. Patient underwent laparoscopic diagnostic and adhesiolysis on 02/15/2018 by general surgery.  Gradually her symptoms improved, bowel function returned.  Food still not tasting right but appetite slowly improving. No nausea or vomiting.  Generally been having a BM daily. No diarrhea.   Following up with General Surgery on 6/25.   Activity slowly improving. Getting back into her routine.    Review of Systems:  Review of Systems  Constitutional: Negative for chills, fever and malaise/fatigue.  Eyes: Negative for blurred vision.  Respiratory: Negative for cough and shortness of breath.   Cardiovascular: Negative for chest pain, palpitations and leg swelling.       Chronic venous insufficiency  Gastrointestinal: Negative for  abdominal pain, blood in stool, constipation and melena.  Genitourinary: Negative for dysuria, frequency and urgency.       OAB  Musculoskeletal: Negative for myalgias.  Skin: Negative for itching and rash.  Neurological: Positive for weakness (improving. ) and headaches (improved with tylenol). Negative for dizziness and loss of consciousness.  Endo/Heme/Allergies: Does not bruise/bleed easily.  Psychiatric/Behavioral: Negative for depression and memory loss.    Past Medical History:  Diagnosis Date  . Disorder of bone and cartilage, unspecified   . Disorders of bursae and tendons in shoulder region, unspecified   . Encounter for long-term (current) use of other medications   . Esophageal reflux   . Hyperosmolality and/or hypernatremia   . Osteoporosis, unspecified   . Other and unspecified hyperlipidemia   . Pain in joint, shoulder region   . Pain in limb   . Palpitations   . Pure hypercholesterolemia   . Reflux esophagitis   . Tachycardia, unspecified   . Unspecified essential hypertension   . Unspecified urinary incontinence    Past Surgical History:  Procedure Laterality Date  . APPENDECTOMY  1955  . Bone Density  08/14/2012  . CATARACT EXTRACTION Bilateral 2010  . CHOLECYSTECTOMY  2004  . COLONOSCOPY  03/31/2009   Diverticulosis, Dr.John Amedeo Plenty   . DIAGNOSTIC MAMMOGRAM    . LAPAROSCOPY N/A 02/15/2018   Procedure: LAPAROSCOPY DIAGNOSTIC, LYSIS OF ADHESION;  Surgeon: Ralene Ok, MD;  Location: [facility];  Service: General;  Laterality: N/A;   Social History:   reports that she has never smoked. She has never used smokeless tobacco. She reports that she does not  drink alcohol or use drugs.  Family History  Problem Relation Age of Onset  . Crohn's disease Neg Hx   . Inflammatory bowel disease Neg Hx     Medications: Patient's Medications  New Prescriptions   No medications on file  Previous Medications   ACETAMINOPHEN (TYLENOL) 325 MG TABLET    Take 325 mg by  mouth every 6 (six) hours as needed for moderate pain.   ATENOLOL (TENORMIN) 25 MG TABLET    Take 1 tablet (25 mg total) by mouth at bedtime.   CHOLECALCIFEROL (VITAMIN D) 2000 UNITS TABLET    Take 2,000 Units by mouth daily.    OMEPRAZOLE (PRILOSEC) 20 MG CAPSULE    Take 1 capsule (20 mg total) by mouth daily.   SIMVASTATIN (ZOCOR) 40 MG TABLET     TAKE ONE TABLET BY MOUTH ONCE DAILY AT 6 PM.   TRAMADOL (ULTRAM) 50 MG TABLET    Take 1 tablet (50 mg total) by mouth every 6 (six) hours as needed for severe pain.  Modified Medications   No medications on file  Discontinued Medications   ONDANSETRON (ZOFRAN) 4 MG/2ML SOLN INJECTION    Inject 2 mLs (4 mg total) into the vein every 6 (six) hours as needed for nausea or vomiting (Use Zofran 1st).     Physical Exam:  Vitals:   02/26/18 0829  BP: 124/72  Pulse: 67  Temp: 98.3 F (36.8 C)  TempSrc: Oral  SpO2: 98%  Weight: 149 lb (67.6 kg)  Height: 4' 11"  (1.499 m)   Body mass index is 30.09 kg/m.  Physical Exam  Constitutional: She is oriented to person, place, and time. She appears well-developed and well-nourished. No distress.  HENT:  Head: Normocephalic and atraumatic.  Eyes: Pupils are equal, round, and reactive to light.  Neck: Normal range of motion.  Cardiovascular: Normal rate, regular rhythm, normal heart sounds and intact distal pulses.  Pulmonary/Chest: Effort normal and breath sounds normal. No respiratory distress.  Abdominal: Soft. Bowel sounds are normal. She exhibits no distension. There is no tenderness.  3 lap sites with small amt of bruising, healing well.  Musculoskeletal: Normal range of motion.  Neurological: She is alert and oriented to person, place, and time. No cranial nerve deficit.  Skin: Skin is warm and dry.  Psychiatric: She has a normal mood and affect.    Labs reviewed: Basic Metabolic Panel: Recent Labs    02/16/18 0422 02/17/18 0459 02/18/18 0344  NA 137 137 134*  K 3.8 4.1 4.2  CL 102  103 105  CO2 28 27 24   GLUCOSE 112* 110* 109*  BUN 19 19 14   CREATININE 0.81 0.64 0.61  CALCIUM 9.6 9.5 9.2  MG 2.2 2.2 2.0   Liver Function Tests: Recent Labs    02/06/18 0904 02/08/18 0500 02/12/18 0417  AST 15 16 55*  ALT 9 13* 41  ALKPHOS  --  59 61  BILITOT 0.5 0.5 0.8  PROT 6.6 6.6 5.9*  ALBUMIN  --  3.8 2.9*   Recent Labs    02/08/18 0500  LIPASE 22   No results for input(s): AMMONIA in the last 8760 hours. CBC: Recent Labs    02/15/18 0439 02/16/18 0422 02/17/18 0459 02/18/18 0344  WBC 11.1* 13.2* 16.6* 13.3*  NEUTROABS 8.7* 11.0*  --  10.0*  HGB 13.4 12.2 12.1 11.6*  HCT 38.8 36.7 36.1 34.9*  MCV 90.0 92.4 91.6 92.6  PLT 318 317 295 283   Lipid Panel: Recent Labs  08/07/17 1131 02/06/18 0904  CHOL 202* 196  HDL 68 59  LDLCALC 110* 110*  TRIG 126 156*  CHOLHDL 3.0 3.3   TSH: No results for input(s): TSH in the last 8760 hours. A1C: No results found for: HGBA1C   Assessment/Plan 1. SBO (small bowel obstruction) s/p lap adhesiolysis 02/15/2018 -improving, no N/V or abdominal pain. Appetite improving and having regular BM. Follow up with surgery as scheduled.  - BASIC METABOLIC PANEL WITH GFR - CBC with Differential/Platelets  2. Thickened endometrium - Ambulatory referral to Gynecology for further evaluation at this time.   3. Hepatic steatosis 2 lesions in the liver noted from CT 02/08/18. They most likely represent benign entities such as hemangiomata but follow up suggested in 6 months. Future MRI ordered for follow up.  - MR Abdomen W Wo Contrast; Future  Next appt: 08/07/2018, sooner if needed Janett Billow K. [place], [facility] [phone]

## 2018-02-26 NOTE — Patient Instructions (Addendum)
Slowly increase activity and diet.  Notify/seek medical attention if nausea, vomiting, diarrhea or abdominal pain occur  Keep follow up as scheduled

## 2018-02-27 ENCOUNTER — Other Ambulatory Visit: Payer: Self-pay

## 2018-02-27 DIAGNOSIS — D72829 Elevated white blood cell count, unspecified: Secondary | ICD-10-CM

## 2018-02-27 DIAGNOSIS — D649 Anemia, unspecified: Secondary | ICD-10-CM

## 2018-02-28 ENCOUNTER — Other Ambulatory Visit: Payer: Self-pay | Admitting: Nurse Practitioner

## 2018-02-28 ENCOUNTER — Ambulatory Visit
Admission: RE | Admit: 2018-02-28 | Discharge: 2018-02-28 | Disposition: A | Discharge status: Home / Self Care | Payer: Medicare Other | Source: Ambulatory Visit | Attending: Nurse Practitioner | Admitting: Nurse Practitioner

## 2018-02-28 ENCOUNTER — Encounter: Payer: Self-pay | Admitting: Nurse Practitioner

## 2018-02-28 ENCOUNTER — Ambulatory Visit (INDEPENDENT_AMBULATORY_CARE_PROVIDER_SITE_OTHER): Payer: Medicare Other | Admitting: Nurse Practitioner

## 2018-02-28 VITALS — BP 118/74 | HR 84 | Temp 98.4°F | Ht 59.0 in | Wt 148.0 lb

## 2018-02-28 DIAGNOSIS — D649 Anemia, unspecified: Secondary | ICD-10-CM | POA: Diagnosis not present

## 2018-02-28 DIAGNOSIS — R1031 Right lower quadrant pain: Secondary | ICD-10-CM | POA: Diagnosis not present

## 2018-02-28 DIAGNOSIS — K449 Diaphragmatic hernia without obstruction or gangrene: Secondary | ICD-10-CM | POA: Diagnosis not present

## 2018-02-28 LAB — COMPREHENSIVE METABOLIC PANEL
AG Ratio: 1.4 (calc) (ref 1.0–2.5)
ALT: 10 U/L (ref 6–29)
AST: 11 U/L (ref 10–35)
Albumin: 3.4 g/dL — ABNORMAL LOW (ref 3.6–5.1)
Alkaline phosphatase (APISO): 84 U/L (ref 33–130)
BUN / CREAT RATIO: 17 (calc) (ref 6–22)
BUN: 10 mg/dL (ref 7–25)
CHLORIDE: 99 mmol/L (ref 98–110)
CO2: 25 mmol/L (ref 20–32)
CREATININE: 0.58 mg/dL — AB (ref 0.60–0.93)
Calcium: 9.8 mg/dL (ref 8.6–10.4)
GLUCOSE: 80 mg/dL (ref 65–99)
Globulin: 2.4 g/dL (calc) (ref 1.9–3.7)
Potassium: 3.8 mmol/L (ref 3.5–5.3)
Sodium: 135 mmol/L (ref 135–146)
Total Bilirubin: 0.6 mg/dL (ref 0.2–1.2)
Total Protein: 5.8 g/dL — ABNORMAL LOW (ref 6.1–8.1)

## 2018-02-28 LAB — CBC WITH DIFFERENTIAL/PLATELET
Basophils Absolute: 30 cells/uL (ref 0–200)
Basophils Relative: 0.2 %
EOS ABS: 59 {cells}/uL (ref 15–500)
Eosinophils Relative: 0.4 %
HCT: 30.8 % — ABNORMAL LOW (ref 35.0–45.0)
Hemoglobin: 11 g/dL — ABNORMAL LOW (ref 11.7–15.5)
Lymphs Abs: 1317 cells/uL (ref 850–3900)
MCH: 31.7 pg (ref 27.0–33.0)
MCHC: 35.7 g/dL (ref 32.0–36.0)
MCV: 88.8 fL (ref 80.0–100.0)
MONOS PCT: 6.9 %
MPV: 8.6 fL (ref 7.5–12.5)
Neutro Abs: 12373 cells/uL — ABNORMAL HIGH (ref 1500–7800)
Neutrophils Relative %: 83.6 %
PLATELETS: 346 10*3/uL (ref 140–400)
RBC: 3.47 10*6/uL — ABNORMAL LOW (ref 3.80–5.10)
RDW: 12.8 % (ref 11.0–15.0)
TOTAL LYMPHOCYTE: 8.9 %
WBC mixed population: 1021 cells/uL — ABNORMAL HIGH (ref 200–950)
WBC: 14.8 10*3/uL — AB (ref 3.8–10.8)

## 2018-02-28 LAB — LIPASE

## 2018-02-28 LAB — AMYLASE: Amylase: 16 U/L — ABNORMAL LOW (ref 21–101)

## 2018-02-28 MED ORDER — IOPAMIDOL (ISOVUE-300) INJECTION 61%
100.0000 mL | Freq: Once | INTRAVENOUS | Status: AC | PRN
  Administered 2018-02-28: 100 mL via INTRAVENOUS

## 2018-02-28 MED ORDER — MOXIFLOXACIN HCL 400 MG PO TABS: 400.0000 mg | ORAL_TABLET | Freq: Every day | ORAL | 0 refills | Status: DC

## 2018-02-28 NOTE — Progress Notes (Signed)
Careteam: Patient Care Team: [name], NP as PCP - General (Geriatric Medicine)  Advanced Directive information Does Patient Have a Medical Advance Directive?: Yes, Type of Advance Directive: Out of facility DNR (pink MOST or yellow form), Pre-existing out of facility DNR order (yellow form or pink MOST form): Yellow form placed in chart (order not valid for inpatient use)  Allergies  Allergen Reactions  . Shellfish Allergy Nausea And Vomiting    Chief Complaint  Patient presents with  . Acute Visit    Pt is being seen due to right side/abdominal pain that comes and goes for 1 day. Pt reports that she can also feel pain in her right shoulder when pain in side starts.      HPI: Patient is a 77 y.o. female seen in the office today due to worsening abdominal pain. Pt was seen on 02/26/18 to follow up hospitalization for SBO and was doing well. Noted to have leukocytosis and anemia on follow up blood work. Yesterday morning she stated she started to have right lower abdominal pain that radiates into right shoulder which is new. Previously she was hurting more generalized lower abdomen.  Reports pain is off and on, can not contribute anything to make it better or worse. Jabs of pain, sometimes spasms.  When the pain hits it is 8/10. No hx of diverticulitis.  Had loose BM yesterday thought that would improve pain but did not.  No blood or mucous in BM.  Overall feeling poorly.  Did not get much sleep last night due to pain.  No pain with urination.  No fevers   Review of Systems:  Review of Systems  Constitutional: Positive for malaise/fatigue. Negative for chills and fever.  Respiratory: Negative for shortness of breath.        Gets short of breath easily with activity  Cardiovascular: Negative for chest pain and palpitations.  Gastrointestinal: Positive for abdominal pain. Negative for blood in stool, constipation, diarrhea, heartburn, melena, nausea and vomiting.    Genitourinary: Negative for dysuria and urgency.  Musculoskeletal: Negative for myalgias.  Neurological: Positive for weakness. Negative for dizziness and headaches.    Past Medical History:  Diagnosis Date  . Disorder of bone and cartilage, unspecified   . Disorders of bursae and tendons in shoulder region, unspecified   . Encounter for long-term (current) use of other medications   . Esophageal reflux   . Hyperosmolality and/or hypernatremia   . Osteoporosis, unspecified   . Other and unspecified hyperlipidemia   . Pain in joint, shoulder region   . Pain in limb   . Palpitations   . Pure hypercholesterolemia   . Reflux esophagitis   . Tachycardia, unspecified   . Unspecified essential hypertension   . Unspecified urinary incontinence    Past Surgical History:  Procedure Laterality Date  . APPENDECTOMY  1955  . Bone Density  08/14/2012  . CATARACT EXTRACTION Bilateral 2010  . CHOLECYSTECTOMY  2004  . COLONOSCOPY  03/31/2009   Diverticulosis, Dr.John Amedeo Plenty   . DIAGNOSTIC MAMMOGRAM    . LAPAROSCOPY N/A 02/15/2018   Procedure: LAPAROSCOPY DIAGNOSTIC, LYSIS OF ADHESION;  Surgeon: Ralene Ok, MD;  Location: [facility];  Service: General;  Laterality: N/A;   Social History:   reports that she has never smoked. She has never used smokeless tobacco. She reports that she does not drink alcohol or use drugs.  Family History  Problem Relation Age of Onset  . Crohn's disease Neg Hx   .  Inflammatory bowel disease Neg Hx     Medications: Patient's Medications  New Prescriptions   No medications on file  Previous Medications   ACETAMINOPHEN (TYLENOL) 325 MG TABLET    Take 325 mg by mouth every 6 (six) hours as needed for moderate pain.   ATENOLOL (TENORMIN) 25 MG TABLET    Take 1 tablet (25 mg total) by mouth at bedtime.   CHOLECALCIFEROL (VITAMIN D) 2000 UNITS TABLET    Take 2,000 Units by mouth daily.    OMEPRAZOLE (PRILOSEC) 20 MG CAPSULE    Take 1 capsule (20 mg total) by  mouth daily.   SIMVASTATIN (ZOCOR) 40 MG TABLET     TAKE ONE TABLET BY MOUTH ONCE DAILY AT 6 PM.   TRAMADOL (ULTRAM) 50 MG TABLET    Take 1 tablet (50 mg total) by mouth every 6 (six) hours as needed for severe pain.  Modified Medications   No medications on file  Discontinued Medications   No medications on file     Physical Exam:  Vitals:   02/28/18 1025  BP: 118/74  Pulse: 84  Temp: 98.4 F (36.9 C)  TempSrc: Oral  SpO2: 96%  Weight: 148 lb (67.1 kg)  Height: 4' 11"  (1.499 m)   Body mass index is 29.89 kg/m.  Physical Exam  Constitutional: She is oriented to person, place, and time. She appears well-nourished. No distress.  Appears like she does not feel well, no acute distress  HENT:  Head: Normocephalic and atraumatic.  Neck: Normal range of motion.  Cardiovascular: Normal rate, regular rhythm and normal heart sounds.  Pulmonary/Chest: Effort normal and breath sounds normal.  Visibility short of breath with moving around in exam room and on exam table  Abdominal: Soft. Bowel sounds are normal. She exhibits no distension and no mass. There is tenderness (mild tenderness to left-mid quad). There is guarding (right lower and upper quad). There is no rebound.  Genitourinary:  Genitourinary Comments: No stool in rectal vault, unable to obtain guaiac   Musculoskeletal: She exhibits no edema or tenderness.  Neurological: She is alert and oriented to person, place, and time.  Skin: Skin is warm and dry. There is pallor.  Psychiatric: She has a normal mood and affect.    Labs reviewed: Basic Metabolic Panel: Recent Labs    02/16/18 0422 02/17/18 0459 02/18/18 0344 02/26/18 0859  NA 137 137 134* 137  K 3.8 4.1 4.2 3.9  CL 102 103 105 101  CO2 28 27 24 28   GLUCOSE 112* 110* 109* 81  BUN 19 19 14 7   CREATININE 0.81 0.64 0.61 0.62  CALCIUM 9.6 9.5 9.2 9.9  MG 2.2 2.2 2.0  --    Liver Function Tests: Recent Labs    02/06/18 0904 02/08/18 0500 02/12/18 0417    AST 15 16 55*  ALT 9 13* 41  ALKPHOS  --  59 61  BILITOT 0.5 0.5 0.8  PROT 6.6 6.6 5.9*  ALBUMIN  --  3.8 2.9*   Recent Labs    02/08/18 0500  LIPASE 22   No results for input(s): AMMONIA in the last 8760 hours. CBC: Recent Labs    02/16/18 0422 02/17/18 0459 02/18/18 0344 02/26/18 0859  WBC 13.2* 16.6* 13.3* 12.8*  NEUTROABS 11.0*  --  10.0* 10,598*  HGB 12.2 12.1 11.6* 10.6*  HCT 36.7 36.1 34.9* 30.3*  MCV 92.4 91.6 92.6 90.7  PLT 317 295 283 336   Lipid Panel: Recent Labs    08/07/17  1131 02/06/18 0904  CHOL 202* 196  HDL 68 59  LDLCALC 110* 110*  TRIG 126 156*  CHOLHDL 3.0 3.3   TSH: No results for input(s): TSH in the last 8760 hours. A1C: No results found for: HGBA1C   Assessment/Plan 1. Acute right lower quadrant pain New right lower quadrant pain with radiation to right shoulder. No fever or chill but elevated WBC on recent lab work, will follow up today.  Recent SBO, following with surgery next week for outpatient follow up.  - CBC with Differential/Platelets - CMP - Amylase - Lipase - CT Abdomen Pelvis W Contrast STAT rule out SBO, abscess, hematoma, etc.   2. Anemia, unspecified type -will follow up CBC today STAT - Fecal Globin By Immunochemistry; Future -CT abdomen and pelvis stat order, concerns for possible ileopsoas hematoma, GI bleed, infection.   Strict precautions to go to the ED if symptoms worsen, shortness of breath, chest pains, severe abdominal, fever etc [place] Harder K. [place], [facility] [phone]

## 2018-03-01 ENCOUNTER — Other Ambulatory Visit: Payer: Self-pay

## 2018-03-01 DIAGNOSIS — D72829 Elevated white blood cell count, unspecified: Secondary | ICD-10-CM

## 2018-03-05 ENCOUNTER — Other Ambulatory Visit: Payer: Medicare Other

## 2018-03-05 DIAGNOSIS — D72829 Elevated white blood cell count, unspecified: Secondary | ICD-10-CM | POA: Diagnosis not present

## 2018-03-05 LAB — CBC WITH DIFFERENTIAL/PLATELET
BASOS PCT: 0.3 %
Basophils Absolute: 21 cells/uL (ref 0–200)
EOS PCT: 3.8 %
Eosinophils Absolute: 262 cells/uL (ref 15–500)
HCT: 31.6 % — ABNORMAL LOW (ref 35.0–45.0)
Hemoglobin: 10.6 g/dL — ABNORMAL LOW (ref 11.7–15.5)
Lymphs Abs: 1242 cells/uL (ref 850–3900)
MCH: 30.3 pg (ref 27.0–33.0)
MCHC: 33.5 g/dL (ref 32.0–36.0)
MCV: 90.3 fL (ref 80.0–100.0)
MONOS PCT: 7.1 %
MPV: 8.4 fL (ref 7.5–12.5)
Neutro Abs: 4885 cells/uL (ref 1500–7800)
Neutrophils Relative %: 70.8 %
PLATELETS: 435 10*3/uL — AB (ref 140–400)
RBC: 3.5 10*6/uL — AB (ref 3.80–5.10)
RDW: 12.9 % (ref 11.0–15.0)
TOTAL LYMPHOCYTE: 18 %
WBC: 6.9 10*3/uL (ref 3.8–10.8)
WBCMIX: 490 {cells}/uL (ref 200–950)

## 2018-03-13 ENCOUNTER — Ambulatory Visit
Admission: RE | Admit: 2018-03-13 | Discharge: 2018-03-13 | Disposition: A | Discharge status: Home / Self Care | Payer: Medicare Other | Source: Ambulatory Visit | Attending: Nurse Practitioner | Admitting: Nurse Practitioner

## 2018-03-13 ENCOUNTER — Other Ambulatory Visit: Payer: Self-pay

## 2018-03-13 DIAGNOSIS — J189 Pneumonia, unspecified organism: Secondary | ICD-10-CM

## 2018-03-13 DIAGNOSIS — J90 Pleural effusion, not elsewhere classified: Secondary | ICD-10-CM | POA: Diagnosis not present

## 2018-03-13 DIAGNOSIS — J9811 Atelectasis: Secondary | ICD-10-CM | POA: Diagnosis not present

## 2018-03-13 DIAGNOSIS — J181 Lobar pneumonia, unspecified organism: Principal | ICD-10-CM

## 2018-03-14 ENCOUNTER — Ambulatory Visit (INDEPENDENT_AMBULATORY_CARE_PROVIDER_SITE_OTHER): Payer: Medicare Other | Admitting: Nurse Practitioner

## 2018-03-14 ENCOUNTER — Encounter: Payer: Self-pay | Admitting: Nurse Practitioner

## 2018-03-14 VITALS — BP 120/78 | HR 87 | Temp 98.0°F | Ht 59.0 in | Wt 144.0 lb

## 2018-03-14 DIAGNOSIS — J181 Lobar pneumonia, unspecified organism: Secondary | ICD-10-CM

## 2018-03-14 DIAGNOSIS — R9389 Abnormal findings on diagnostic imaging of other specified body structures: Secondary | ICD-10-CM

## 2018-03-14 DIAGNOSIS — R091 Pleurisy: Secondary | ICD-10-CM | POA: Diagnosis not present

## 2018-03-14 DIAGNOSIS — J189 Pneumonia, unspecified organism: Secondary | ICD-10-CM

## 2018-03-14 MED ORDER — TRAMADOL HCL 50 MG PO TABS: 50.0000 mg | ORAL_TABLET | Freq: Four times a day (QID) | ORAL | 0 refills | Status: DC | PRN

## 2018-03-14 NOTE — Patient Instructions (Addendum)
On your abdominal CT there was abnormal thickening of the endometrium therefore it is recommended that you follow up with gynecology please reschedule your appt    Follow up in 4 weeks to follow up pleurisy  May use tylenol 500 mg 2 tablets every 8 hours as needed for pain  OR  Tramadol 1 tablet by mouth every 6 hours as needed for pain.

## 2018-03-14 NOTE — Progress Notes (Signed)
Careteam: Patient Care Team: [name], NP as PCP - General (Geriatric Medicine)  Advanced Directive information Does Patient Have a Medical Advance Directive?: Yes, Type of Advance Directive: Out of facility DNR (pink MOST or yellow form), Pre-existing out of facility DNR order (yellow form or pink MOST form): Yellow form placed in chart (order not valid for inpatient use)  Allergies  Allergen Reactions  . Shellfish Allergy Nausea And Vomiting    Chief Complaint  Patient presents with  . Follow-up    Pt is being seen due to ongoing right side pain. pt states that it hurts badly when she had to hold her breath for chest xray.      HPI: Patient is a 77 y.o. female seen in the office today due to chest pain. Pt was seen in June due to abdominal pain and it was noted that she had right lower lobe pneumonia and she was found to have elevated WBC. She was treated with Avelox for 7 days and WBC returned to normal last week. She is now having symptoms on right sided pain. 5/10 today, stabbing pain. Yesterday was 10/10.  No fever No shortness of breath Gets tired.  No weakness.  No cough. Reports pain is below right breast.  When she takes a deep breath she notices the pain. It has improved today vs yesterday.  Only feels pain when she takes a deep breath otherwise is not bothered by the pain.  Has used tylenol which did not help.   Review of Systems:  Review of Systems  Constitutional: Negative for chills, diaphoresis, fever, malaise/fatigue and weight loss.  HENT: Negative for tinnitus.   Respiratory: Negative for cough, sputum production and shortness of breath.        Pain with deep breaths  Cardiovascular: Negative for chest pain, palpitations and leg swelling.  Gastrointestinal: Negative for abdominal pain, constipation, diarrhea and heartburn.  Genitourinary: Negative for dysuria.  Musculoskeletal: Negative for joint pain.  Skin: Negative.   Neurological:  Negative for weakness.    Past Medical History:  Diagnosis Date  . Disorder of bone and cartilage, unspecified   . Disorders of bursae and tendons in shoulder region, unspecified   . Encounter for long-term (current) use of other medications   . Esophageal reflux   . Hyperosmolality and/or hypernatremia   . Osteoporosis, unspecified   . Other and unspecified hyperlipidemia   . Pain in joint, shoulder region   . Pain in limb   . Palpitations   . Pure hypercholesterolemia   . Reflux esophagitis   . Tachycardia, unspecified   . Unspecified essential hypertension   . Unspecified urinary incontinence    Past Surgical History:  Procedure Laterality Date  . APPENDECTOMY  1955  . Bone Density  08/14/2012  . CATARACT EXTRACTION Bilateral 2010  . CHOLECYSTECTOMY  2004  . COLONOSCOPY  03/31/2009   Diverticulosis, Dr.John Amedeo Plenty   . DIAGNOSTIC MAMMOGRAM    . LAPAROSCOPY N/A 02/15/2018   Procedure: LAPAROSCOPY DIAGNOSTIC, LYSIS OF ADHESION;  Surgeon: Ralene Ok, MD;  Location: [facility];  Service: General;  Laterality: N/A;   Social History:   reports that she has never smoked. She has never used smokeless tobacco. She reports that she does not drink alcohol or use drugs.  Family History  Problem Relation Age of Onset  . Crohn's disease Neg Hx   . Inflammatory bowel disease Neg Hx     Medications: Patient's Medications  New Prescriptions   No medications  on file  Previous Medications   ACETAMINOPHEN (TYLENOL) 325 MG TABLET    Take 325 mg by mouth every 6 (six) hours as needed for moderate pain.   ATENOLOL (TENORMIN) 25 MG TABLET    Take 1 tablet (25 mg total) by mouth at bedtime.   CHOLECALCIFEROL (VITAMIN D) 2000 UNITS TABLET    Take 2,000 Units by mouth daily.    OMEPRAZOLE (PRILOSEC) 20 MG CAPSULE    Take 1 capsule (20 mg total) by mouth daily.   SIMVASTATIN (ZOCOR) 40 MG TABLET     TAKE ONE TABLET BY MOUTH ONCE DAILY AT 6 PM.   TRAMADOL (ULTRAM) 50 MG TABLET    Take 1 tablet  (50 mg total) by mouth every 6 (six) hours as needed for severe pain.  Modified Medications   No medications on file  Discontinued Medications   MOXIFLOXACIN (AVELOX) 400 MG TABLET    Take 1 tablet (400 mg total) by mouth daily.     Physical Exam:  Vitals:   03/14/18 1440  BP: 120/78  Pulse: 87  Temp: 98 F (36.7 C)  TempSrc: Oral  SpO2: 96%  Weight: 144 lb (65.3 kg)  Height: 4' 11"  (1.499 m)   Body mass index is 29.08 kg/m.  Physical Exam  Constitutional: She is oriented to person, place, and time. She appears well-developed and well-nourished. No distress.  HENT:  Head: Normocephalic and atraumatic.  Mouth/Throat: Oropharynx is clear and moist. No oropharyngeal exudate.  Eyes: Pupils are equal, round, and reactive to light. Conjunctivae are normal.  Neck: Normal range of motion. Neck supple.  Cardiovascular: Normal rate, regular rhythm and normal heart sounds.  Pulmonary/Chest: Effort normal and breath sounds normal. No stridor. No respiratory distress. She has no wheezes. She has no rales. She exhibits no tenderness.  No chest wall tenderness  Musculoskeletal: She exhibits no tenderness.  Neurological: She is alert and oriented to person, place, and time.  Skin: Skin is warm and dry. She is not diaphoretic.  Psychiatric: She has a normal mood and affect.   Labs reviewed: Basic Metabolic Panel: Recent Labs    02/16/18 0422 02/17/18 0459 02/18/18 0344 02/26/18 0859 02/28/18 1554  NA 137 137 134* 137 135  K 3.8 4.1 4.2 3.9 3.8  CL 102 103 105 101 99  CO2 28 27 24 28 25   GLUCOSE 112* 110* 109* 81 80  BUN 19 19 14 7 10   CREATININE 0.81 0.64 0.61 0.62 0.58*  CALCIUM 9.6 9.5 9.2 9.9 9.8  MG 2.2 2.2 2.0  --   --    Liver Function Tests: Recent Labs    02/08/18 0500 02/12/18 0417 02/28/18 1554  AST 16 55* 11  ALT 13* 41 10  ALKPHOS 59 61  --   BILITOT 0.5 0.8 0.6  PROT 6.6 5.9* 5.8*  ALBUMIN 3.8 2.9*  --    Recent Labs    02/08/18 0500 02/28/18 1554   LIPASE 22 <5*  AMYLASE  --  16*   No results for input(s): AMMONIA in the last 8760 hours. CBC: Recent Labs    02/26/18 0859 02/28/18 1554 03/05/18 1000  WBC 12.8* 14.8* 6.9  NEUTROABS 10,598* 12,373* 4,885  HGB 10.6* 11.0* 10.6*  HCT 30.3* 30.8* 31.6*  MCV 90.7 88.8 90.3  PLT 336 346 435*   Lipid Panel: Recent Labs    08/07/17 1131 02/06/18 0904  CHOL 202* 196  HDL 68 59  LDLCALC 110* 110*  TRIG 126 156*  CHOLHDL 3.0 3.3  TSH: No results for input(s): TSH in the last 8760 hours. A1C: No results found for: HGBA1C   Assessment/Plan 1. Thickened endometrium Cancelled GYN appt, she was unsure why she was supposed to follow up. Educated her of findings on abdominal CT and encouraged to reschedule.   2. Pneumonia of right lower lobe due to infectious organism [facility]) -has resolved, encouraged deep breathing to avoid recurrence  3. Pleurisy -after pneumonia. Pain improved today however could take several more weeks to resolve. Encouraged deep breathing. Can use tylenol 1000 mg by mouth every 8 hours as needed and if that is not effective to use tramadol PRN - traMADol (ULTRAM) 50 MG tablet; Take 1 tablet (50 mg total) by mouth every 6 (six) hours as needed for severe pain.  Dispense: 30 tablet; Refill: 0  Next appt: 4 weeks for follow up pleurisy  Denee Boeder K. [place], [facility] [phone]

## 2018-04-04 DIAGNOSIS — N8500 Endometrial hyperplasia, unspecified: Secondary | ICD-10-CM | POA: Diagnosis not present

## 2018-04-16 ENCOUNTER — Ambulatory Visit (INDEPENDENT_AMBULATORY_CARE_PROVIDER_SITE_OTHER): Payer: Medicare Other | Admitting: Internal Medicine

## 2018-04-16 ENCOUNTER — Encounter: Payer: Self-pay | Admitting: Internal Medicine

## 2018-04-16 VITALS — BP 124/76 | HR 68 | Temp 98.6°F | Ht 59.0 in | Wt 143.0 lb

## 2018-04-16 DIAGNOSIS — J181 Lobar pneumonia, unspecified organism: Secondary | ICD-10-CM | POA: Diagnosis not present

## 2018-04-16 DIAGNOSIS — R091 Pleurisy: Secondary | ICD-10-CM | POA: Diagnosis not present

## 2018-04-16 DIAGNOSIS — R9389 Abnormal findings on diagnostic imaging of other specified body structures: Secondary | ICD-10-CM | POA: Diagnosis not present

## 2018-04-16 DIAGNOSIS — E785 Hyperlipidemia, unspecified: Secondary | ICD-10-CM

## 2018-04-16 DIAGNOSIS — J189 Pneumonia, unspecified organism: Secondary | ICD-10-CM

## 2018-04-16 NOTE — Progress Notes (Signed)
Location:  [facility] clinic Provider:  Glenyce Randle L. Mariea Clonts, D.O., C.M.D.  Code Status: DNR Goals of Care:  Advanced Directives 03/14/2018  Does Patient Have a Medical Advance Directive? Yes  Type of Advance Directive Out of facility DNR (pink MOST or yellow form)  Does patient want to make changes to medical advance directive? -  Copy of [place] in Chart? -  Would patient like information on creating a medical advance directive? -  Pre-existing out of facility DNR order (yellow form or pink MOST form) Yellow form placed in chart (order not valid for inpatient use)     Chief Complaint  Patient presents with  . Follow-up    Pt is being seen for a 4 week follow up for pleurisy. Pt reports that she is feeling much better.   . Audit C Screening    score of 0    HPI: Patient is a 77 y.o. female seen today for medical management of chronic diseases.    Pleurisy resolved--had pain below the right breast.  Did not have a cough even with her pneumonia.  Feels much better overall.  No new concerns.  She did reschedule her gyn appt.  She goes Wednesday morning for an [place] with Dr. Deatra Ina.  He will use that to determine if she needs a biopsy.  She has not had any bleeding or signs of anything.    No other pains.  Indigestion well controlled. 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    BP at goal.  She has a lipid panel for November.    Past Medical History:  Diagnosis Date  . Disorder of bone and cartilage, unspecified   . Disorders of bursae and tendons in shoulder region, unspecified   . Encounter for long-term (current) use of other medications   . Esophageal reflux   . Hyperosmolality and/or hypernatremia   . Osteoporosis, unspecified   . Other and unspecified hyperlipidemia   . Pain in joint, shoulder region   . Pain in limb   . Palpitations   . Pure hypercholesterolemia   . Reflux esophagitis   . Tachycardia, unspecified   . Unspecified essential  hypertension   . Unspecified urinary incontinence     Past Surgical History:  Procedure Laterality Date  . APPENDECTOMY  1955  . Bone Density  08/14/2012  . CATARACT EXTRACTION Bilateral 2010  . CHOLECYSTECTOMY  2004  . COLONOSCOPY  03/31/2009   Diverticulosis, Dr.John Amedeo Plenty   . DIAGNOSTIC MAMMOGRAM    . LAPAROSCOPY N/A 02/15/2018   Procedure: LAPAROSCOPY DIAGNOSTIC, LYSIS OF ADHESION;  Surgeon: Ralene Ok, MD;  Location: [facility];  Service: General;  Laterality: N/A;    Allergies  Allergen Reactions  . Shellfish Allergy Nausea And Vomiting    Outpatient Encounter Medications as of 04/16/2018  Medication Sig  . acetaminophen (TYLENOL) 325 MG tablet Take 325 mg by mouth every 6 (six) hours as needed for moderate pain.  Marland Kitchen atenolol (TENORMIN) 25 MG tablet Take 1 tablet (25 mg total) by mouth at bedtime.  . Cholecalciferol (VITAMIN D) 2000 UNITS tablet Take 2,000 Units by mouth daily.   Marland Kitchen omeprazole (PRILOSEC) 20 MG capsule Take 1 capsule (20 mg total) by mouth daily.  . simvastatin (ZOCOR) 40 MG tablet  TAKE ONE TABLET BY MOUTH ONCE DAILY AT 6 PM.  . traMADol (ULTRAM) 50 MG tablet Take 1 tablet (50 mg total) by mouth every 6 (six) hours as needed for severe pain.   No facility-administered encounter  medications on file as of 04/16/2018.     Review of Systems:  Review of Systems  Constitutional: Negative for chills, fever and malaise/fatigue.  HENT: Negative for congestion and hearing loss.   Eyes: Negative for blurred vision.  Respiratory: Negative for cough, sputum production and shortness of breath.   Cardiovascular: Negative for chest pain, palpitations and leg swelling.  Gastrointestinal: Negative for abdominal pain, blood in stool, constipation, diarrhea and melena.  Genitourinary: Negative for dysuria.       No vaginal bleeding  Musculoskeletal: Negative for falls.  Skin: Negative for itching and rash.  Neurological: Negative for dizziness and loss of consciousness.    Endo/Heme/Allergies: Does not bruise/bleed easily.  Psychiatric/Behavioral: Negative for depression and memory loss. The patient is not nervous/anxious and does not have insomnia.     Health Maintenance  Topic Date Due  . MAMMOGRAM  08/25/2017  . INFLUENZA VACCINE  04/12/2018  . TETANUS/TDAP  07/04/2021  . DEXA SCAN  Completed  . PNA vac Low Risk Adult  Completed    Physical Exam: Vitals:   04/16/18 1132  BP: 124/76  Pulse: 68  Temp: 98.6 F (37 C)  TempSrc: Oral  SpO2: 97%  Weight: 143 lb (64.9 kg)  Height: 4' 11"  (1.499 m)   Body mass index is 28.88 kg/m. Physical Exam  Constitutional: She is oriented to person, place, and time. She appears well-developed and well-nourished. No distress.  Cardiovascular: Normal rate, regular rhythm, normal heart sounds and intact distal pulses.  Pulmonary/Chest: Effort normal and breath sounds normal. No respiratory distress.  Abdominal: Soft. Bowel sounds are normal.  Musculoskeletal: Normal range of motion. She exhibits no tenderness.  Neurological: She is alert and oriented to person, place, and time.  Skin: Skin is warm and dry.  Psychiatric: She has a normal mood and affect.    Labs reviewed: Basic Metabolic Panel: Recent Labs    02/16/18 0422 02/17/18 0459 02/18/18 0344 02/26/18 0859 02/28/18 1554  NA 137 137 134* 137 135  K 3.8 4.1 4.2 3.9 3.8  CL 102 103 105 101 99  CO2 28 27 24 28 25   GLUCOSE 112* 110* 109* 81 80  BUN 19 19 14 7 10   CREATININE 0.81 0.64 0.61 0.62 0.58*  CALCIUM 9.6 9.5 9.2 9.9 9.8  MG 2.2 2.2 2.0  --   --    Liver Function Tests: Recent Labs    02/08/18 0500 02/12/18 0417 02/28/18 1554  AST 16 55* 11  ALT 13* 41 10  ALKPHOS 59 61  --   BILITOT 0.5 0.8 0.6  PROT 6.6 5.9* 5.8*  ALBUMIN 3.8 2.9*  --    Recent Labs    02/08/18 0500 02/28/18 1554  LIPASE 22 <5*  AMYLASE  --  16*   No results for input(s): AMMONIA in the last 8760 hours. CBC: Recent Labs    02/26/18 0859  02/28/18 1554 03/05/18 1000  WBC 12.8* 14.8* 6.9  NEUTROABS 10,598* 12,373* 4,885  HGB 10.6* 11.0* 10.6*  HCT 30.3* 30.8* 31.6*  MCV 90.7 88.8 90.3  PLT 336 346 435*   Lipid Panel: Recent Labs    08/07/17 1131 02/06/18 0904  CHOL 202* 196  HDL 68 59  LDLCALC 110* 110*  TRIG 126 156*  CHOLHDL 3.0 3.3   Assessment/Plan 1. Pneumonia of right lower lobe due to infectious organism [facility]) -resolved  2. Pleurisy -resolved   3. Thickened endometrium -keep [place] appt--may need biopsy -has been asymptomatic  4. Hyperlipidemia with target LDL  less than 130 -cont statin therapy  Labs/tests ordered: No orders of the defined types were placed in this encounter.  Next appt:  08/07/2018--keep scheduled appt labs before   Greysen Devino L. Monic Engelmann, D.O. [facility] [address], [place] [address] Cell Phone (Mon-Fri 8am-5pm):  [phone] On Call:  [phone] & follow prompts after 5pm & weekends Office Phone:  [phone] Office Fax:  [phone]

## 2018-04-19 DIAGNOSIS — N8500 Endometrial hyperplasia, unspecified: Secondary | ICD-10-CM | POA: Diagnosis not present

## 2018-05-01 DIAGNOSIS — Z1231 Encounter for screening mammogram for malignant neoplasm of breast: Secondary | ICD-10-CM | POA: Diagnosis not present

## 2018-05-01 LAB — HM MAMMOGRAPHY

## 2018-05-25 DIAGNOSIS — H6123 Impacted cerumen, bilateral: Secondary | ICD-10-CM | POA: Diagnosis not present

## 2018-06-14 DIAGNOSIS — L578 Other skin changes due to chronic exposure to nonionizing radiation: Secondary | ICD-10-CM | POA: Diagnosis not present

## 2018-06-14 DIAGNOSIS — L821 Other seborrheic keratosis: Secondary | ICD-10-CM | POA: Diagnosis not present

## 2018-06-14 DIAGNOSIS — L658 Other specified nonscarring hair loss: Secondary | ICD-10-CM | POA: Diagnosis not present

## 2018-06-22 ENCOUNTER — Ambulatory Visit (INDEPENDENT_AMBULATORY_CARE_PROVIDER_SITE_OTHER): Payer: Medicare Other

## 2018-06-22 DIAGNOSIS — Z23 Encounter for immunization: Secondary | ICD-10-CM

## 2018-07-16 ENCOUNTER — Other Ambulatory Visit: Payer: Self-pay | Admitting: Nurse Practitioner

## 2018-08-07 ENCOUNTER — Other Ambulatory Visit: Payer: Medicare Other

## 2018-08-07 DIAGNOSIS — E785 Hyperlipidemia, unspecified: Secondary | ICD-10-CM | POA: Diagnosis not present

## 2018-08-07 DIAGNOSIS — L820 Inflamed seborrheic keratosis: Secondary | ICD-10-CM | POA: Diagnosis not present

## 2018-08-07 DIAGNOSIS — I119 Hypertensive heart disease without heart failure: Secondary | ICD-10-CM | POA: Diagnosis not present

## 2018-08-07 LAB — CBC WITH DIFFERENTIAL/PLATELET
BASOS ABS: 22 {cells}/uL (ref 0–200)
BASOS PCT: 0.4 %
EOS PCT: 2.2 %
Eosinophils Absolute: 121 cells/uL (ref 15–500)
HEMATOCRIT: 35.1 % (ref 35.0–45.0)
Hemoglobin: 11.8 g/dL (ref 11.7–15.5)
LYMPHS ABS: 1810 {cells}/uL (ref 850–3900)
MCH: 29.2 pg (ref 27.0–33.0)
MCHC: 33.6 g/dL (ref 32.0–36.0)
MCV: 86.9 fL (ref 80.0–100.0)
MONOS PCT: 7.1 %
MPV: 9.2 fL (ref 7.5–12.5)
NEUTROS ABS: 3157 {cells}/uL (ref 1500–7800)
Neutrophils Relative %: 57.4 %
Platelets: 313 10*3/uL (ref 140–400)
RBC: 4.04 10*6/uL (ref 3.80–5.10)
RDW: 13.7 % (ref 11.0–15.0)
Total Lymphocyte: 32.9 %
WBC mixed population: 391 cells/uL (ref 200–950)
WBC: 5.5 10*3/uL (ref 3.8–10.8)

## 2018-08-07 LAB — COMPLETE METABOLIC PANEL WITH GFR
AG Ratio: 2 (calc) (ref 1.0–2.5)
ALKALINE PHOSPHATASE (APISO): 77 U/L (ref 33–130)
ALT: 9 U/L (ref 6–29)
AST: 14 U/L (ref 10–35)
Albumin: 4.2 g/dL (ref 3.6–5.1)
BUN: 17 mg/dL (ref 7–25)
CALCIUM: 10.4 mg/dL (ref 8.6–10.4)
CO2: 29 mmol/L (ref 20–32)
CREATININE: 0.7 mg/dL (ref 0.60–0.93)
Chloride: 105 mmol/L (ref 98–110)
GFR, EST NON AFRICAN AMERICAN: 82 mL/min/{1.73_m2} (ref >=60)
GFR, Est African American: 96 mL/min/{1.73_m2} (ref >=60)
GLUCOSE: 79 mg/dL (ref 65–99)
Globulin: 2.1 g/dL (calc) (ref 1.9–3.7)
Potassium: 4.7 mmol/L (ref 3.5–5.3)
Sodium: 141 mmol/L (ref 135–146)
Total Bilirubin: 0.5 mg/dL (ref 0.2–1.2)
Total Protein: 6.3 g/dL (ref 6.1–8.1)

## 2018-08-07 LAB — LIPID PANEL
CHOL/HDL RATIO: 3.2 (calc) (ref <5.0)
CHOLESTEROL: 207 mg/dL — AB (ref <200)
HDL: 64 mg/dL (ref >50)
LDL CHOLESTEROL (CALC): 115 mg/dL — AB
NON-HDL CHOLESTEROL (CALC): 143 mg/dL — AB (ref <130)
Triglycerides: 160 mg/dL — ABNORMAL HIGH (ref <150)

## 2018-08-13 ENCOUNTER — Ambulatory Visit (INDEPENDENT_AMBULATORY_CARE_PROVIDER_SITE_OTHER): Payer: Medicare Other | Admitting: Nurse Practitioner

## 2018-08-13 ENCOUNTER — Encounter: Payer: Self-pay | Admitting: Nurse Practitioner

## 2018-08-13 VITALS — BP 118/74 | HR 70 | Temp 97.9°F | Ht <= 58 in | Wt 150.8 lb

## 2018-08-13 DIAGNOSIS — I119 Hypertensive heart disease without heart failure: Secondary | ICD-10-CM | POA: Diagnosis not present

## 2018-08-13 DIAGNOSIS — Z0000 Encounter for general adult medical examination without abnormal findings: Secondary | ICD-10-CM

## 2018-08-13 DIAGNOSIS — E785 Hyperlipidemia, unspecified: Secondary | ICD-10-CM

## 2018-08-13 MED ORDER — ZOSTER VAC RECOMB ADJUVANTED 50 MCG/0.5ML IM SUSR: 0.5000 mL | Freq: Once | INTRAMUSCULAR | 1 refills | Status: AC

## 2018-08-13 NOTE — Patient Instructions (Signed)
Heart-Healthy Eating Plan Many factors influence your heart health, including eating and exercise habits. Heart (coronary) risk increases with abnormal blood fat (lipid) levels. Heart-healthy meal planning includes limiting unhealthy fats, increasing healthy fats, and making other small dietary changes. This includes maintaining a healthy body weight to help keep lipid levels within a normal range. What types of fat should I choose?  Choose healthy fats more often. Choose monounsaturated and polyunsaturated fats, such as olive oil and canola oil, flaxseeds, walnuts, almonds, and seeds.  Eat more omega-3 fats. Good choices include salmon, mackerel, sardines, tuna, flaxseed oil, and ground flaxseeds. Aim to eat fish at least two times each week.  Limit saturated fats. Saturated fats are primarily found in animal products, such as meats, butter, and cream. Plant sources of saturated fats include palm oil, palm kernel oil, and coconut oil.  Avoid foods with partially hydrogenated oils in them. These contain trans fats. Examples of foods that contain trans fats are stick margarine, some tub margarines, cookies, crackers, and other baked goods. What general guidelines do I need to follow?  Check food labels carefully to identify foods with trans fats or high amounts of saturated fat.  Fill one half of your plate with vegetables and green salads. Eat 4-5 servings of vegetables per day. A serving of vegetables equals 1 cup of raw leafy vegetables,  cup of raw or cooked cut-up vegetables, or  cup of vegetable juice.  Fill one fourth of your plate with whole grains. Look for the word "whole" as the first word in the ingredient list.  Fill one fourth of your plate with lean protein foods.  Eat 4-5 servings of fruit per day. A serving of fruit equals one medium whole fruit,  cup of dried fruit,  cup of fresh, frozen, or canned fruit, or  cup of 100% fruit juice.  Eat more foods that contain  soluble fiber. Examples of foods that contain this type of fiber are apples, broccoli, carrots, beans, peas, and barley. Aim to get 20-30 g of fiber per day.  Eat more home-cooked food and less restaurant, buffet, and fast food.  Limit or avoid alcohol.  Limit foods that are high in starch and sugar.  Avoid fried foods.  Cook foods by using methods other than frying. Baking, boiling, grilling, and broiling are all great options. Other fat-reducing suggestions include: ? Removing the skin from poultry. ? Removing all visible fats from meats. ? Skimming the fat off of stews, soups, and gravies before serving them. ? Steaming vegetables in water or broth.  Lose weight if you are overweight. Losing just 5-10% of your initial body weight can help your overall health and prevent diseases such as diabetes and heart disease.  Increase your consumption of nuts, legumes, and seeds to 4-5 servings per week. One serving of dried beans or legumes equals  cup after being cooked, one serving of nuts equals 1 ounces, and one serving of seeds equals  ounce or 1 tablespoon.  You may need to monitor your salt (sodium) intake, especially if you have high blood pressure. Talk with your health care provider or dietitian to get more information about reducing sodium. What foods can I eat? Grains  Breads, including [place], white, pita, wheat, raisin, rye, oatmeal, and [place]. Tortillas that are neither fried nor made with lard or trans fat. Low-fat rolls, including hotdog and hamburger buns and English muffins. Biscuits. Muffins. Waffles. Pancakes. Light popcorn. Whole-grain cereals. Flatbread. Melba toast. Pretzels. Breadsticks. Rusks. Low-fat  snacks and crackers, including oyster, saltine, matzo, graham, animal, and rye. Rice and pasta, including brown rice and those that are made with whole wheat. Vegetables All vegetables. Fruits All fruits, but limit coconut. Meats and Other Protein Sources Lean,  well-trimmed beef, veal, pork, and lamb. Chicken and [place] without skin. All fish and shellfish. Wild duck, rabbit, pheasant, and venison. Egg whites or low-cholesterol egg substitutes. Dried beans, peas, lentils, and tofu.Seeds and most nuts. Dairy Low-fat or nonfat cheeses, including ricotta, string, and mozzarella. Skim or 1% milk that is liquid, powdered, or evaporated. Buttermilk that is made with low-fat milk. Nonfat or low-fat yogurt. Beverages Mineral water. Diet carbonated beverages. Sweets and Desserts Sherbets and fruit ices. Honey, jam, marmalade, jelly, and syrups. Meringues and gelatins. Pure sugar candy, such as hard candy, jelly beans, gumdrops, mints, marshmallows, and small amounts of dark chocolate. [employer] Eat all sweets and desserts in moderation. Fats and Oils Nonhydrogenated (trans-free) margarines. Vegetable oils, including soybean, sesame, sunflower, olive, peanut, safflower, corn, canola, and cottonseed. Salad dressings or mayonnaise that are made with a vegetable oil. Limit added fats and oils that you use for cooking, baking, salads, and as spreads. Other Cocoa powder. Coffee and tea. All seasonings and condiments. The items listed above may not be a complete list of recommended foods or beverages. Contact your dietitian for more options. What foods are not recommended? Grains Breads that are made with saturated or trans fats, oils, or whole milk. Croissants. Butter rolls. Cheese breads. Sweet rolls. Donuts. Buttered popcorn. Chow mein noodles. High-fat crackers, such as cheese or butter crackers. Meats and Other Protein Sources Fatty meats, such as hotdogs, short ribs, sausage, spareribs, bacon, ribeye roast or steak, and mutton. High-fat deli meats, such as salami and bologna. Caviar. Domestic duck and goose. Organ meats, such as kidney, liver, sweetbreads, brains, gizzard, chitterlings, and heart. Dairy Cream, sour cream, cream cheese, and creamed cottage  cheese. Whole milk cheeses, including blue (bleu), Monte[place] Jack, [place], [place], American, [place], Swiss, Ver[place], [place], and [place]. Whole or 2% milk that is liquid, evaporated, or condensed. Whole buttermilk. Cream sauce or high-fat cheese sauce. Yogurt that is made from whole milk. Beverages Regular sodas and drinks with added sugar. Sweets and Desserts Frosting. Pudding. Cookies. Cakes other than angel food cake. Candy that has milk chocolate or white chocolate, hydrogenated fat, butter, coconut, or unknown ingredients. Buttered syrups. Full-fat ice cream or ice cream drinks. Fats and Oils Gravy that has suet, meat fat, or shortening. Cocoa butter, hydrogenated oils, palm oil, coconut oil, palm kernel oil. These can often be found in baked products, candy, fried foods, nondairy creamers, and whipped toppings. Solid fats and shortenings, including bacon fat, salt pork, lard, and butter. Nondairy cream substitutes, such as coffee creamers and sour cream substitutes. Salad dressings that are made of unknown oils, cheese, or sour cream. The items listed above may not be a complete list of foods and beverages to avoid. Contact your dietitian for more information. This information is not intended to replace advice given to you by your health care provider. Make sure you discuss any questions you have with your health care provider. Document Released: 06/07/2008 Document Revised: 03/18/2016 Document Reviewed: 02/20/2014 Elsevier Interactive Patient Education  2018 [place] Maintenance, Female Adopting a healthy lifestyle and getting preventive care can go a long way to promote health and wellness. Talk with your health care provider about what schedule of regular examinations is right for you. This is a  good chance for you to check in with your provider about disease prevention and staying healthy. In between checkups, there are plenty of things you can do on your own. Experts have  done a lot of research about which lifestyle changes and preventive measures are most likely to keep you healthy. Ask your health care provider for more information. Weight and diet Eat a healthy diet  Be sure to include plenty of vegetables, fruits, low-fat dairy products, and lean protein.  Do not eat a lot of foods high in solid fats, added sugars, or salt.  Get regular exercise. This is one of the most important things you can do for your health. ? Most adults should exercise for at least 150 minutes each week. The exercise should increase your heart rate and make you sweat (moderate-intensity exercise). ? Most adults should also do strengthening exercises at least twice a week. This is in addition to the moderate-intensity exercise.  Maintain a healthy weight  Body mass index (BMI) is a measurement that can be used to identify possible weight problems. It estimates body fat based on height and weight. Your health care provider can help determine your BMI and help you achieve or maintain a healthy weight.  For females 96 years of age and older: ? A BMI below 18.5 is considered underweight. ? A BMI of 18.5 to 24.9 is normal. ? A BMI of 25 to 29.9 is considered overweight. ? A BMI of 30 and above is considered obese.  Watch levels of cholesterol and blood lipids  You should start having your blood tested for lipids and cholesterol at 79 years of age, then have this test every 5 years.  You may need to have your cholesterol levels checked more often if: ? Your lipid or cholesterol levels are high. ? You are older than 79 years of age. ? You are at high risk for heart disease.  Cancer screening Lung Cancer  Lung cancer screening is recommended for adults 52-15 years old who are at high risk for lung cancer because of a history of smoking.  A yearly low-dose CT scan of the lungs is recommended for people who: ? Currently smoke. ? Have quit within the past 15 years. ? Have at  least a 30-pack-year history of smoking. A pack year is smoking an average of one pack of cigarettes a day for 1 year.  Yearly screening should continue until it has been 15 years since you quit.  Yearly screening should stop if you develop a health problem that would prevent you from having lung cancer treatment.  Breast Cancer  Practice breast self-awareness. This means understanding how your breasts normally appear and feel.  It also means doing regular breast self-exams. Let your health care provider know about any changes, no matter how small.  If you are in your 20s or 30s, you should have a clinical breast exam (CBE) by a health care provider every 1-3 years as part of a regular health exam.  If you are 48 or older, have a CBE every year. Also consider having a breast X-ray (mammogram) every year.  If you have a family history of breast cancer, talk to your health care provider about genetic screening.  If you are at high risk for breast cancer, talk to your health care provider about having an MRI and a mammogram every year.  Breast cancer gene (BRCA) assessment is recommended for women who have family members with BRCA-related cancers. BRCA-related cancers include: ?  Breast. ? Ovarian. ? Tubal. ? Peritoneal cancers.  Results of the assessment will determine the need for genetic counseling and BRCA1 and BRCA2 testing.  Cervical Cancer Your health care provider may recommend that you be screened regularly for cancer of the pelvic organs (ovaries, uterus, and vagina). This screening involves a pelvic examination, including checking for microscopic changes to the surface of your cervix (Pap test). You may be encouraged to have this screening done every 3 years, beginning at age 46.  For women ages 41-65, health care providers may recommend pelvic exams and Pap testing every 3 years, or they may recommend the Pap and pelvic exam, combined with testing for human papilloma virus  (HPV), every 5 years. Some types of HPV increase your risk of cervical cancer. Testing for HPV may also be done on women of any age with unclear Pap test results.  Other health care providers may not recommend any screening for nonpregnant women who are considered low risk for pelvic cancer and who do not have symptoms. Ask your health care provider if a screening pelvic exam is right for you.  If you have had past treatment for cervical cancer or a condition that could lead to cancer, you need Pap tests and screening for cancer for at least 20 years after your treatment. If Pap tests have been discontinued, your risk factors (such as having a new sexual partner) need to be reassessed to determine if screening should resume. Some women have medical problems that increase the chance of getting cervical cancer. In these cases, your health care provider may recommend more frequent screening and Pap tests.  Colorectal Cancer  This type of cancer can be detected and often prevented.  Routine colorectal cancer screening usually begins at 79 years of age and continues through 79 years of age.  Your health care provider may recommend screening at an earlier age if you have risk factors for colon cancer.  Your health care provider may also recommend using home test kits to check for hidden blood in the stool.  A small camera at the end of a tube can be used to examine your colon directly (sigmoidoscopy or colonoscopy). This is done to check for the earliest forms of colorectal cancer.  Routine screening usually begins at age 19.  Direct examination of the colon should be repeated every 5-10 years through 79 years of age. However, you may need to be screened more often if early forms of precancerous polyps or small growths are found.  Skin Cancer  Check your skin from head to toe regularly.  Tell your health care provider about any new moles or changes in moles, especially if there is a change in a  mole's shape or color.  Also tell your health care provider if you have a mole that is larger than the size of a pencil eraser.  Always use sunscreen. Apply sunscreen liberally and repeatedly throughout the day.  Protect yourself by wearing long sleeves, pants, a wide-brimmed hat, and sunglasses whenever you are outside.  Heart disease, diabetes, and high blood pressure  High blood pressure causes heart disease and increases the risk of stroke. High blood pressure is more likely to develop in: ? People who have blood pressure in the high end of the normal range (130-139/85-89 mm Hg). ? People who are overweight or obese. ? People who are African American.  If you are 31-64 years of age, have your blood pressure checked every 3-5 years. If you are 40  years of age or older, have your blood pressure checked every year. You should have your blood pressure measured twice-once when you are at a hospital or clinic, and once when you are not at a hospital or clinic. Record the average of the two measurements. To check your blood pressure when you are not at a hospital or clinic, you can use: ? An automated blood pressure machine at a pharmacy. ? A home blood pressure monitor.  If you are between 1 years and 29 years old, ask your health care provider if you should take aspirin to prevent strokes.  Have regular diabetes screenings. This involves taking a blood sample to check your fasting blood sugar level. ? If you are at a normal weight and have a low risk for diabetes, have this test once every three years after 79 years of age. ? If you are overweight and have a high risk for diabetes, consider being tested at a younger age or more often. Preventing infection Hepatitis B  If you have a higher risk for hepatitis B, you should be screened for this virus. You are considered at high risk for hepatitis B if: ? You were born in a country where hepatitis B is common. Ask your health care provider  which countries are considered high risk. ? Your parents were born in a high-risk country, and you have not been immunized against hepatitis B (hepatitis B vaccine). ? You have HIV or AIDS. ? You use needles to inject street drugs. ? You live with someone who has hepatitis B. ? You have had sex with someone who has hepatitis B. ? You get hemodialysis treatment. ? You take certain medicines for conditions, including cancer, organ transplantation, and autoimmune conditions.  Hepatitis C  Blood testing is recommended for: ? Everyone born from [birth year] through 1967. ? Anyone with known risk factors for hepatitis C.  Sexually transmitted infections (STIs)  You should be screened for sexually transmitted infections (STIs) including gonorrhea and chlamydia if: ? You are sexually active and are younger than 79 years of age. ? You are older than 79 years of age and your health care provider tells you that you are at risk for this type of infection. ? Your sexual activity has changed since you were last screened and you are at an increased risk for chlamydia or gonorrhea. Ask your health care provider if you are at risk.  If you do not have HIV, but are at risk, it may be recommended that you take a prescription medicine daily to prevent HIV infection. This is called pre-exposure prophylaxis (PrEP). You are considered at risk if: ? You are sexually active and do not regularly use condoms or know the HIV status of your partner(s). ? You take drugs by injection. ? You are sexually active with a partner who has HIV.  Talk with your health care provider about whether you are at high risk of being infected with HIV. If you choose to begin PrEP, you should first be tested for HIV. You should then be tested every 3 months for as long as you are taking PrEP. Pregnancy  If you are premenopausal and you may become pregnant, ask your health care provider about preconception counseling.  If you may become  pregnant, take 400 to 800 micrograms (mcg) of folic acid every day.  If you want to prevent pregnancy, talk to your health care provider about birth control (contraception). Osteoporosis and menopause  Osteoporosis is a disease in which  the bones lose minerals and strength with aging. This can result in serious bone fractures. Your risk for osteoporosis can be identified using a bone density scan.  If you are 31 years of age or older, or if you are at risk for osteoporosis and fractures, ask your health care provider if you should be screened.  Ask your health care provider whether you should take a calcium or vitamin D supplement to lower your risk for osteoporosis.  Menopause may have certain physical symptoms and risks.  Hormone replacement therapy may reduce some of these symptoms and risks. Talk to your health care provider about whether hormone replacement therapy is right for you. Follow these instructions at home:  Schedule regular health, dental, and eye exams.  Stay current with your immunizations.  Do not use any tobacco products including cigarettes, chewing tobacco, or electronic cigarettes.  If you are pregnant, do not drink alcohol.  If you are breastfeeding, limit how much and how often you drink alcohol.  Limit alcohol intake to no more than 1 drink per day for nonpregnant women. One drink equals 12 ounces of beer, 5 ounces of wine, or 1 ounces of hard liquor.  Do not use street drugs.  Do not share needles.  Ask your health care provider for help if you need support or information about quitting drugs.  Tell your health care provider if you often feel depressed.  Tell your health care provider if you have ever been abused or do not feel safe at home. This information is not intended to replace advice given to you by your health care provider. Make sure you discuss any questions you have with your health care provider. Document Released: 03/14/2011 Document  Revised: 02/04/2016 Document Reviewed: 06/02/2015 Elsevier Interactive Patient Education  [employer].

## 2018-08-13 NOTE — Progress Notes (Signed)
Provider: [name], NP  Patient Care Team: [name], NP as PCP - General (Geriatric Medicine)  Extended Emergency Contact Information Primary Emergency Contact: Quackenbush,Dewey Address: [place] [address] [place] Phone: [phone] Relation: Spouse Allergies  Allergen Reactions  . Shellfish Allergy Nausea And Vomiting   Code Status: DNR  Goals of Care: Advanced Directive information Advanced Directives 08/13/2018  Does Patient Have a Medical Advance Directive? Yes  Type of Advance Directive Out of facility DNR (pink MOST or yellow form)  Does patient want to make changes to medical advance directive? No - Patient declined  Copy of R[place] in Chart? -  Would patient like information on creating a medical advance directive? -  Pre-existing out of facility DNR order (yellow form or pink MOST form) Yellow form placed in chart (order not valid for inpatient use)     Chief Complaint  Patient presents with  . Medical Management of Chronic Issues    6 month follow up,discuss Dexa scan to see if she need one this year    HPI: Patient is a 77 y.o. female seen in today for an annual wellness exam.   Major illnesses or hospitalization in the last year had hospitalization for SBO then noted to have pneumonia and pleurisy  following up with Dr Deatra Ina due to thickening of the endometrium.   Depression screen [facility] 2/9 08/07/2017 08/07/2017 01/30/2017 08/01/2016 07/30/2015  Decreased Interest 0 0 0 0 0  Down, Depressed, Hopeless 0 0 0 0 0  PHQ - 2 Score 0 0 0 0 0    Fall Risk  08/13/2018 04/16/2018 03/14/2018 02/28/2018 02/26/2018  Falls in the past year? 0 No No No No  Number falls in past yr: 0 - - - -  Injury with Fall? 0 - - - -   MMSE - Mini Mental State Exam 08/07/2017 08/01/2016 07/30/2015 07/23/2014  Orientation to time 5 5 5 5   Orientation to Place 5 5 5 5   Registration 3 3 3 3   Attention/  Calculation 5 5 5 5   Recall 2 3 3 3   Language- name 2 objects 2 2 2 2   Language- repeat 1 1 1 1   Language- follow 3 step command 3 3 3 3   Language- read & follow direction 1 1 1 1   Write a sentence 1 1 1 1   Copy design 1 1 1 1   Total score 29 30 30 30      Health Maintenance  Topic Date Due  . MAMMOGRAM  05/02/2019  . TETANUS/TDAP  07/04/2021  . INFLUENZA VACCINE  Completed  . DEXA SCAN  Completed  . PNA vac Low Risk Adult  Completed    Diet? No special diet.   Just came from low impact aerobics- 3 times a week 45 mins.   Dentition: every 6 months going to dentist.   Ophthalmology- every 2 years, had cataracts removed.   Pain:none  Past Medical History:  Diagnosis Date  . Disorder of bone and cartilage, unspecified   . Disorders of bursae and tendons in shoulder region, unspecified   . Encounter for long-term (current) use of other medications   . Esophageal reflux   . Hyperosmolality and/or hypernatremia   . Osteoporosis, unspecified   . Other and unspecified hyperlipidemia   . Pain in joint, shoulder region   . Pain in limb   . Palpitations   . Pure  hypercholesterolemia   . Reflux esophagitis   . Tachycardia, unspecified   . Unspecified essential hypertension   . Unspecified urinary incontinence     Past Surgical History:  Procedure Laterality Date  . APPENDECTOMY  1955  . Bone Density  08/14/2012  . CATARACT EXTRACTION Bilateral 2010  . CHOLECYSTECTOMY  2004  . COLONOSCOPY  03/31/2009   Diverticulosis, Dr.John Amedeo Plenty   . DIAGNOSTIC MAMMOGRAM    . LAPAROSCOPY N/A 02/15/2018   Procedure: LAPAROSCOPY DIAGNOSTIC, LYSIS OF ADHESION;  Surgeon: Ralene Ok, MD;  Location: [facility];  Service: General;  Laterality: N/A;    Social History   Socioeconomic History  . Marital status: Married    Spouse name: Not on file  . Number of children: Not on file  . Years of education: Not on file  . Highest education level: Not on file  Occupational History  . Not  on file  Social Needs  . Financial resource strain: Not hard at all  . Food insecurity:    Worry: Never true    Inability: Never true  . Transportation needs:    Medical: No    Non-medical: No  Tobacco Use  . Smoking status: Never Smoker  . Smokeless tobacco: Never Used  Substance and Sexual Activity  . Alcohol use: No  . Drug use: No  . Sexual activity: Never  Lifestyle  . Physical activity:    Days per week: 2 days    Minutes per session: 30 min  . Stress: Only a little  Relationships  . Social connections:    Talks on phone: More than three times a week    Gets together: More than three times a week    Attends religious service: 1 to 4 times per year    Active member of club or organization: Yes    Attends meetings of clubs or organizations: 1 to 4 times per year    Relationship status: Married  Other Topics Concern  . Not on file  Social History Narrative  . Not on file    Family History  Problem Relation Age of Onset  . Crohn's disease Neg Hx   . Inflammatory bowel disease Neg Hx     Review of Systems:  Review of Systems  Constitutional: Negative for activity change, appetite change, fatigue and unexpected weight change.  HENT: Negative for congestion and hearing loss.   Eyes: Negative.   Respiratory: Negative for cough and shortness of breath.   Cardiovascular: Negative for chest pain, palpitations and leg swelling.  Gastrointestinal: Negative for abdominal pain, constipation and diarrhea.  Genitourinary: Negative for difficulty urinating and dysuria.  Musculoskeletal: Negative for arthralgias and myalgias.  Skin: Negative for color change and wound.  Neurological: Negative for dizziness and weakness.  Psychiatric/Behavioral: Negative for agitation, behavioral problems and confusion.     Allergies as of 08/13/2018      Reactions   Shellfish Allergy Nausea And Vomiting      Medication List        Accurate as of 08/13/18 11:16 AM. Always use your  most recent med list.          acetaminophen 325 MG tablet Commonly known as:  TYLENOL Take 325 mg by mouth every 6 (six) hours as needed for moderate pain.   atenolol 25 MG tablet Commonly known as:  TENORMIN Take 1 tablet (25 mg total) by mouth at bedtime.   omeprazole 20 MG capsule Commonly known as:  PRILOSEC Take 1 capsule (20 mg total)  by mouth daily.   simvastatin 40 MG tablet Commonly known as:  ZOCOR TAKE ONE TABLET BY MOUTH ONCE DAILY AT 6 PM.   traMADol 50 MG tablet Commonly known as:  ULTRAM Take 1 tablet (50 mg total) by mouth every 6 (six) hours as needed for severe pain.   Vitamin D 50 MCG (2000 UT) tablet Take 2,000 Units by mouth daily.         Physical Exam: Vitals:   08/13/18 1034  BP: 118/74  Pulse: 70  Temp: 97.9 F (36.6 C)  TempSrc: Oral  SpO2: 100%  Weight: 150 lb 12.8 oz (68.4 kg)  Height: 4' 10"  (1.473 m)   Body mass index is 31.52 kg/m. Physical Exam  Constitutional: She is oriented to person, place, and time. She appears well-developed and well-nourished. No distress.  HENT:  Head: Normocephalic and atraumatic.  Right Ear: External ear normal.  Left Ear: External ear normal.  Nose: Nose normal.  Mouth/Throat: Oropharynx is clear and moist.  Eyes: Pupils are equal, round, and reactive to light. EOM are normal.  Neck: Normal range of motion. Neck supple.  Cardiovascular: Normal rate, regular rhythm and normal heart sounds.  Pulmonary/Chest: Effort normal and breath sounds normal. No respiratory distress.  Abdominal: Soft. Bowel sounds are normal.  Musculoskeletal: Normal range of motion. She exhibits no tenderness.  Neurological: She is alert and oriented to person, place, and time.  Skin: Skin is warm and dry.  Psychiatric: She has a normal mood and affect.    Labs reviewed: Basic Metabolic Panel: Recent Labs    02/16/18 0422 02/17/18 0459 02/18/18 0344 02/26/18 0859 02/28/18 1554 08/07/18 0801  NA 137 137 134* 137  135 141  K 3.8 4.1 4.2 3.9 3.8 4.7  CL 102 103 105 101 99 105  CO2 28 27 24 28 25 29   GLUCOSE 112* 110* 109* 81 80 79  BUN 19 19 14 7 10 17   CREATININE 0.81 0.64 0.61 0.62 0.58* 0.70  CALCIUM 9.6 9.5 9.2 9.9 9.8 10.4  MG 2.2 2.2 2.0  --   --   --    Liver Function Tests: Recent Labs    02/08/18 0500 02/12/18 0417 02/28/18 1554 08/07/18 0801  AST 16 55* 11 14  ALT 13* 41 10 9  ALKPHOS 59 61  --   --   BILITOT 0.5 0.8 0.6 0.5  PROT 6.6 5.9* 5.8* 6.3  ALBUMIN 3.8 2.9*  --   --    Recent Labs    02/08/18 0500 02/28/18 1554  LIPASE 22 <5*  AMYLASE  --  16*   No results for input(s): AMMONIA in the last 8760 hours. CBC: Recent Labs    02/28/18 1554 03/05/18 1000 08/07/18 0801  WBC 14.8* 6.9 5.5  NEUTROABS 12,373* 4,885 3,157  HGB 11.0* 10.6* 11.8  HCT 30.8* 31.6* 35.1  MCV 88.8 90.3 86.9  PLT 346 435* 313   Lipid Panel: Recent Labs    02/06/18 0904 08/07/18 0801  CHOL 196 207*  HDL 59 64  LDLCALC 110* 115*  TRIG 156* 160*  CHOLHDL 3.3 3.2   No results found for: HGBA1C  Procedures: No results found.  Assessment/Plan 1. Benign hypertensive heart disease without heart failure Controlled on atenolol 25 mg daily - EKG 12-Lead- left BBB consistent with previous, sinus rate 58   2. Wellness examination She is doing well, patient was educated regarding the appropriate use of alcohol, regular self-examination of the breasts on a monthly basis, prevention of  dental and periodontal disease, diet, regular sustained exercise for at least 30 minutes 5 times per week, routine screening interval for mammogram as recommended by the [place] and ACOG, importance of regular PAP smears, and recommended schedule for GI hemoccult testing, colonoscopy, cholesterol, thyroid and diabetes screening. - DNR (Do Not Resuscitate)  3. Hyperlipidemia with target LDL less than 130 -to continue to work on dietary modifications.  continues on zocor 40 mg daily  - CMP;  Future - Lipid Panel; Future   Next appt: 6 months, lab work prior to [employer]. [place], [facility] [phone]

## 2018-08-13 NOTE — Addendum Note (Signed)
Addended by: [name] on: 08/13/2018 04:06 PM   Modules accepted: Level of Service

## 2018-08-20 ENCOUNTER — Ambulatory Visit (INDEPENDENT_AMBULATORY_CARE_PROVIDER_SITE_OTHER): Payer: Medicare Other

## 2018-08-20 VITALS — BP 122/80 | HR 62 | Temp 97.6°F | Ht <= 58 in | Wt 150.8 lb

## 2018-08-20 DIAGNOSIS — Z0000 Encounter for general adult medical examination without abnormal findings: Secondary | ICD-10-CM

## 2018-08-20 NOTE — Patient Instructions (Signed)
Ms. Brooke Cox , Thank you for taking time to come for your Medicare Wellness Visit. I appreciate your ongoing commitment to your health goals. Please review the following plan we discussed and let me know if I can assist you in the future.   Screening recommendations/referrals: Colonoscopy: 09/2015, Next due 09/2025 Mammogram: Up to date, next due 04/2019 Bone Density: Up to date Recommended yearly ophthalmology/optometry visit for glaucoma screening and checkup Recommended yearly dental visit for hygiene and checkup  Vaccinations: Influenza vaccine: Up to date Pneumococcal vaccine: Up to date Tdap vaccine: Up to date, next due 06/2021 Shingles vaccine: Due, has prescription, will receive from the pharmacy this month  Advanced directives: Advance directive discussed with you today. I have provided a copy for you to complete at home and have notarized. Once this is complete please bring a copy in to our office so we can scan it into your chart.   Conditions/risks identified: Obese, recommend continuing your current exercise program at least 3 days a week for 30-45 minutes at a time as tolerated. Increase water intake to 4-5 glasses a day if posisble   Next appointment: 02/11/2018 at 8:30 With [place] 79 Years and Older, Female Preventive care refers to lifestyle choices and visits with your health care provider that can promote health and wellness. What does preventive care include?  A yearly physical exam. This is also called an annual well check.  Dental exams once or twice a year.  Routine eye exams. Ask your health care provider how often you should have your eyes checked.  Personal lifestyle choices, including:  Daily care of your teeth and gums.  Regular physical activity.  Eating a healthy diet.  Avoiding tobacco and drug use.  Limiting alcohol use.  Practicing safe sex.  Taking low-dose aspirin every day.  Taking vitamin and mineral  supplements as recommended by your health care provider. What happens during an annual well check? The services and screenings done by your health care provider during your annual well check will depend on your age, overall health, lifestyle risk factors, and family history of disease. Counseling  Your health care provider may ask you questions about your:  Alcohol use.  Tobacco use.  Drug use.  Emotional well-being.  Home and relationship well-being.  Sexual activity.  Eating habits.  History of falls.  Memory and ability to understand (cognition).  Work and work Statistician.  Reproductive health. Screening  You may have the following tests or measurements:  Height, weight, and BMI.  Blood pressure.  Lipid and cholesterol levels. These may be checked every 5 years, or more frequently if you are over 23 years old.  Skin check.  Lung cancer screening. You may have this screening every year starting at age 37 if you have a 30-pack-year history of smoking and currently smoke or have quit within the past 15 years.  Fecal occult blood test (FOBT) of the stool. You may have this test every year starting at age 79.  Flexible sigmoidoscopy or colonoscopy. You may have a sigmoidoscopy every 5 years or a colonoscopy every 10 years starting at age 54.  Hepatitis C blood test.  Hepatitis B blood test.  Sexually transmitted disease (STD) testing.  Diabetes screening. This is done by checking your blood sugar (glucose) after you have not eaten for a while (fasting). You may have this done every 1-3 years.  Bone density scan. This is done to screen for osteoporosis. You may have this done  starting at age 79.  Mammogram. This may be done every 1-2 years. Talk to your health care provider about how often you should have regular mammograms. Talk with your health care provider about your test results, treatment options, and if necessary, the need for more tests. Vaccines  Your  health care provider may recommend certain vaccines, such as:  Influenza vaccine. This is recommended every year.  Tetanus, diphtheria, and acellular pertussis (Tdap, Td) vaccine. You may need a Td booster every 10 years.  Zoster vaccine. You may need this after age 79.  Pneumococcal 13-valent conjugate (PCV13) vaccine. One dose is recommended after age 23.  Pneumococcal polysaccharide (PPSV23) vaccine. One dose is recommended after age 79. Talk to your health care provider about which screenings and vaccines you need and how often you need them. This information is not intended to replace advice given to you by your health care provider. Make sure you discuss any questions you have with your health care provider. Document Released: 09/25/2015 Document Revised: 05/18/2016 Document Reviewed: 06/30/2015 Elsevier Interactive Patient Education  2017 [place] Prevention in the Home Falls can cause injuries. They can happen to people of all ages. There are many things you can do to make your home safe and to help prevent falls. What can I do on the outside of my home?  Regularly fix the edges of walkways and driveways and fix any cracks.  Remove anything that might make you trip as you walk through a door, such as a raised step or threshold.  Trim any bushes or trees on the path to your home.  Use bright outdoor lighting.  Clear any walking paths of anything that might make someone trip, such as rocks or tools.  Regularly check to see if handrails are loose or broken. Make sure that both sides of any steps have handrails.  Any raised decks and porches should have guardrails on the edges.  Have any leaves, snow, or ice cleared regularly.  Use sand or salt on walking paths during winter.  Clean up any spills in your garage right away. This includes oil or grease spills. What can I do in the bathroom?  Use night lights.  Install grab bars by the toilet and in the tub and  shower. Do not use towel bars as grab bars.  Use non-skid mats or decals in the tub or shower.  If you need to sit down in the shower, use a plastic, non-slip stool.  Keep the floor dry. Clean up any water that spills on the floor as soon as it happens.  Remove soap buildup in the tub or shower regularly.  Attach bath mats securely with double-sided non-slip rug tape.  Do not have throw rugs and other things on the floor that can make you trip. What can I do in the bedroom?  Use night lights.  Make sure that you have a light by your bed that is easy to reach.  Do not use any sheets or blankets that are too big for your bed. They should not hang down onto the floor.  Have a firm chair that has side arms. You can use this for support while you get dressed.  Do not have throw rugs and other things on the floor that can make you trip. What can I do in the kitchen?  Clean up any spills right away.  Avoid walking on wet floors.  Keep items that you use a lot in easy-to-reach places.  If you need to reach something above you, use a strong step stool that has a grab bar.  Keep electrical cords out of the way.  Do not use floor polish or wax that makes floors slippery. If you must use wax, use non-skid floor wax.  Do not have throw rugs and other things on the floor that can make you trip. What can I do with my stairs?  Do not leave any items on the stairs.  Make sure that there are handrails on both sides of the stairs and use them. Fix handrails that are broken or loose. Make sure that handrails are as long as the stairways.  Check any carpeting to make sure that it is firmly attached to the stairs. Fix any carpet that is loose or worn.  Avoid having throw rugs at the top or bottom of the stairs. If you do have throw rugs, attach them to the floor with carpet tape.  Make sure that you have a light switch at the top of the stairs and the bottom of the stairs. If you do not  have them, ask someone to add them for you. What else can I do to help prevent falls?  Wear shoes that:  Do not have high heels.  Have rubber bottoms.  Are comfortable and fit you well.  Are closed at the toe. Do not wear sandals.  If you use a stepladder:  Make sure that it is fully opened. Do not climb a closed stepladder.  Make sure that both sides of the stepladder are locked into place.  Ask someone to hold it for you, if possible.  Clearly mark and make sure that you can see:  Any grab bars or handrails.  First and last steps.  Where the edge of each step is.  Use tools that help you move around (mobility aids) if they are needed. These include:  Canes.  Walkers.  Scooters.  Crutches.  Turn on the lights when you go into a dark area. Replace any light bulbs as soon as they burn out.  Set up your furniture so you have a clear path. Avoid moving your furniture around.  If any of your floors are uneven, fix them.  If there are any pets around you, be aware of where they are.  Review your medicines with your doctor. Some medicines can make you feel dizzy. This can increase your chance of falling. Ask your doctor what other things that you can do to help prevent falls. This information is not intended to replace advice given to you by your health care provider. Make sure you discuss any questions you have with your health care provider. Document Released: 06/25/2009 Document Revised: 02/04/2016 Document Reviewed: 10/03/2014 Elsevier Interactive Patient Education  2017 [employer].

## 2018-08-20 NOTE — Progress Notes (Addendum)
Subjective:   Brooke Cox is a 77 y.o. female who presents for Medicare Annual (Subsequent) preventive examination.  Review of Systems: N/A Cardiac Risk Factors include: advanced age (>70mn, >>85women);hypertension;obesity (BMI >30kg/m2)     Objective:     Vitals: BP 122/80   Pulse 62   Temp 97.6 F (36.4 C) (Oral)   Ht 4' 10"  (1.473 m)   Wt 150 lb 12.8 oz (68.4 kg)   SpO2 93%   BMI 31.52 kg/m   Body mass index is 31.52 kg/m.  Advanced Directives 08/20/2018 08/13/2018 03/14/2018 02/28/2018 02/26/2018 02/08/2018 02/06/2018  Does Patient Have a Medical Advance Directive? No Yes Yes Yes Yes Yes Yes  Type of Advance Directive - Out of facility DNR (pink MOST or yellow form) Out of facility DNR (pink MOST or yellow form) Out of facility DNR (pink MOST or yellow form) Out of facility DNR (pink MOST or yellow form) Out of facility DNR (pink MOST or yellow form) Out of facility DNR (pink MOST or yellow form)  Does patient want to make changes to medical advance directive? - No - Patient declined - - - No - Patient declined -  Copy of HFenwickin Chart? - - - - - - -  Would patient like information on creating a medical advance directive? Yes (MAU/Ambulatory/Procedural Areas - Information given) - - - - - -  Pre-existing out of facility DNR order (yellow form or pink MOST form) - Yellow form placed in chart (order not valid for inpatient use) Yellow form placed in chart (order not valid for inpatient use) Yellow form placed in chart (order not valid for inpatient use) Yellow form placed in chart (order not valid for inpatient use) Yellow form placed in chart (order not valid for inpatient use) Yellow form placed in chart (order not valid for inpatient use)    Tobacco Social History   Tobacco Use  Smoking Status Never Smoker  Smokeless Tobacco Never Used     Counseling given: Not Answered   Clinical Intake:  Pre-visit preparation completed: Yes  Pain : No/denies  pain     Nutritional Status: BMI > 30  Obese Nutritional Risks: None Diabetes: No  How often do you need to have someone help you when you read instructions, pamphlets, or other written materials from your doctor or pharmacy?: 1 - Never What is the last grade level you completed in school?: 12th grade  Interpreter Needed?: No  Information entered by :: KHarriso, LPN  Past Medical History:  Diagnosis Date  . Disorder of bone and cartilage, unspecified   . Disorders of bursae and tendons in shoulder region, unspecified   . Encounter for long-term (current) use of other medications   . Esophageal reflux   . Hyperosmolality and/or hypernatremia   . Osteoporosis, unspecified   . Other and unspecified hyperlipidemia   . Pain in joint, shoulder region   . Pain in limb   . Palpitations   . Pure hypercholesterolemia   . Reflux esophagitis   . Tachycardia, unspecified   . Unspecified essential hypertension   . Unspecified urinary incontinence    Past Surgical History:  Procedure Laterality Date  . APPENDECTOMY  1955  . Bone Density  08/14/2012  . CATARACT EXTRACTION Bilateral 2010  . CHOLECYSTECTOMY  2004  . COLONOSCOPY  03/31/2009   Diverticulosis, Dr.John HAmedeo Plenty  . DIAGNOSTIC MAMMOGRAM    . LAPAROSCOPY N/A 02/15/2018   Procedure: LAPAROSCOPY DIAGNOSTIC, LYSIS OF ADHESION;  Surgeon:  Ralene Ok, MD;  Location: [facility];  Service: General;  Laterality: N/A;   Family History  Problem Relation Age of Onset  . Hypertension Mother   . Diabetes Brother   . Crohn's disease Neg Hx   . Inflammatory bowel disease Neg Hx    Social History   Socioeconomic History  . Marital status: Married    Spouse name: Not on file  . Number of children: Not on file  . Years of education: Not on file  . Highest education level: Not on file  Occupational History  . Occupation: retired  Scientific laboratory technician  . Financial resource strain: Not hard at all  . Food insecurity:    Worry: Never true     Inability: Never true  . Transportation needs:    Medical: No    Non-medical: No  Tobacco Use  . Smoking status: Never Smoker  . Smokeless tobacco: Never Used  Substance and Sexual Activity  . Alcohol use: No  . Drug use: No  . Sexual activity: Not Currently  Lifestyle  . Physical activity:    Days per week: 3 days    Minutes per session: 40 min  . Stress: Only a little  Relationships  . Social connections:    Talks on phone: More than three times a week    Gets together: More than three times a week    Attends religious service: 1 to 4 times per year    Active member of club or organization: Yes    Attends meetings of clubs or organizations: 1 to 4 times per year    Relationship status: Married  Other Topics Concern  . Not on file  Social History Narrative  . Not on file    Outpatient Encounter Medications as of 08/20/2018  Medication Sig  . acetaminophen (TYLENOL) 325 MG tablet Take 325 mg by mouth every 6 (six) hours as needed for moderate pain.  Marland Kitchen atenolol (TENORMIN) 25 MG tablet Take 1 tablet (25 mg total) by mouth at bedtime.  . Cholecalciferol (VITAMIN D) 2000 UNITS tablet Take 2,000 Units by mouth daily.   Marland Kitchen omeprazole (PRILOSEC) 20 MG capsule Take 1 capsule (20 mg total) by mouth daily.  . simvastatin (ZOCOR) 40 MG tablet TAKE ONE TABLET BY MOUTH ONCE DAILY AT 6 PM.   No facility-administered encounter medications on file as of 08/20/2018.     Activities of Daily Living In your present state of health, do you have any difficulty performing the following activities: 08/20/2018 02/08/2018  Hearing? N N  Vision? N N  Difficulty concentrating or making decisions? Y N  Comment occasional forgetfulness -  Walking or climbing stairs? N N  Dressing or bathing? N N  Doing errands, shopping? N N  Preparing Food and eating ? N -  Using the Toilet? N -  In the past six months, have you accidently leaked urine? Y -  Comment occasionally -  Do you have problems with loss  of bowel control? N -  Managing your Medications? N -  Managing your Finances? N -  Housekeeping or managing your Housekeeping? N -  Some recent data might be hidden    Patient Care Team: [name], NP as PCP - General (Geriatric Medicine) Renard Hamper Marguerita Merles (Physician Assistant)    Assessment:   This is a routine wellness examination for Brooke Cox.  Exercise Activities and Dietary recommendations Current Exercise Habits: Structured exercise class, Type of exercise: Other - see comments(low impact Aerobics), Time (  Minutes): 45, Frequency (Times/Week): 3, Weekly Exercise (Minutes/Week): 135, Intensity: Moderate, Exercise limited by: None identified  Goals    . DIET - INCREASE WATER INTAKE     Increase water to 4-5 cups a day.    . Increase water intake     Starting 08/01/16, I will increase my water intake from 3-4 glasses per day to 8 glasses per day.        Fall Risk Fall Risk  08/20/2018 08/13/2018 04/16/2018 03/14/2018 02/28/2018  Falls in the past year? 0 0 No No No  Number falls in past yr: - 0 - - -  Injury with Fall? - 0 - - -   Is the patient's home free of loose throw rugs in walkways, pet beds, electrical cords, etc?   yes      Grab bars in the bathroom? no      Handrails on the stairs?   yes      Adequate lighting?   yes  Timed Get Up and Go performed: N/A  Depression Screen PHQ 2/9 Scores 08/20/2018 08/07/2017 08/07/2017 01/30/2017  PHQ - 2 Score 0 0 0 0     Cognitive Function MMSE - Mini Mental State Exam 08/20/2018 08/07/2017 08/01/2016 07/30/2015 07/23/2014  Orientation to time 5 5 5 5 5   Orientation to Place 5 5 5 5 5   Registration 3 3 3 3 3   Attention/ Calculation 5 5 5 5 5   Recall 3 2 3 3 3   Language- name 2 objects 2 2 2 2 2   Language- repeat 1 1 1 1 1   Language- follow 3 step command 3 3 3 3 3   Language- read & follow direction 1 1 1 1 1   Write a sentence 1 1 1 1 1   Copy design 1 1 1 1 1   Total score 30 29 30 30 30         Immunization  History  Administered Date(s) Administered  . Influenza, High Dose Seasonal PF 07/12/2017, 06/22/2018  . Influenza,inj,Quad PF,6+ Mos 07/03/2013, 07/30/2015, 08/01/2016  . Influenza-Unspecified 07/03/2012, 06/12/2014  . Pneumococcal Conjugate-13 07/23/2014  . Pneumococcal Polysaccharide-23 02/20/2008  . Tdap 07/05/2011  . Zoster 07/28/2011    Qualifies for Shingles Vaccine? Yes, Patient has prescription from last week and is planning to obtain sometime this month  Screening Tests Health Maintenance  Topic Date Due  . MAMMOGRAM  05/02/2019  . TETANUS/TDAP  07/04/2021  . INFLUENZA VACCINE  Completed  . DEXA SCAN  Completed  . PNA vac Low Risk Adult  Completed    Cancer Screenings: Lung: Low Dose CT Chest recommended if Age 37-80 years, 30 pack-year currently smoking OR have quit w/in 15years. Patient does not qualify. Breast:  Up to date on Mammogram? Yes   Up to date of Bone Density/Dexa? Yes Colorectal: Up to date  Additional Screenings: : Hepatitis C Screening: patient refused    Plan:      I have personally reviewed and noted the following in the patient's chart:   . Medical and social history . Use of alcohol, tobacco or illicit drugs  . Current medications and supplements . Functional ability and status . Nutritional status . Physical activity . Advanced directives . List of other physicians . Hospitalizations, surgeries, and ER visits in previous 12 months . Vitals . Screenings to include cognitive, depression, and falls . Referrals and appointments  In addition, I have reviewed and discussed with patient certain preventive protocols, quality metrics, and best practice recommendations. A written personalized care  plan for preventive services as well as general preventive health recommendations were provided to patient.     Stormy Fabian, LPN  [id number]

## 2018-08-21 ENCOUNTER — Other Ambulatory Visit: Payer: Medicare Other

## 2018-08-21 ENCOUNTER — Ambulatory Visit
Admission: RE | Admit: 2018-08-21 | Discharge: 2018-08-21 | Disposition: A | Discharge status: Home / Self Care | Payer: Medicare Other | Source: Ambulatory Visit | Attending: Nurse Practitioner | Admitting: Nurse Practitioner

## 2018-08-21 DIAGNOSIS — K760 Fatty (change of) liver, not elsewhere classified: Secondary | ICD-10-CM

## 2018-08-21 DIAGNOSIS — K449 Diaphragmatic hernia without obstruction or gangrene: Secondary | ICD-10-CM | POA: Diagnosis not present

## 2018-08-21 DIAGNOSIS — K769 Liver disease, unspecified: Secondary | ICD-10-CM | POA: Diagnosis not present

## 2018-08-21 MED ORDER — GADOBENATE DIMEGLUMINE 529 MG/ML IV SOLN
14.0000 mL | Freq: Once | INTRAVENOUS | Status: AC | PRN
  Administered 2018-08-21: 14 mL via INTRAVENOUS

## 2018-10-06 ENCOUNTER — Other Ambulatory Visit: Payer: Self-pay | Admitting: Nurse Practitioner

## 2018-12-30 ENCOUNTER — Other Ambulatory Visit: Payer: Self-pay | Admitting: Nurse Practitioner

## 2019-02-05 ENCOUNTER — Other Ambulatory Visit: Payer: Medicare Other

## 2019-02-05 ENCOUNTER — Other Ambulatory Visit: Payer: Self-pay

## 2019-02-05 DIAGNOSIS — E785 Hyperlipidemia, unspecified: Secondary | ICD-10-CM | POA: Diagnosis not present

## 2019-02-05 LAB — COMPREHENSIVE METABOLIC PANEL
AG Ratio: 1.6 (calc) (ref 1.0–2.5)
ALT: 9 U/L (ref 6–29)
AST: 14 U/L (ref 10–35)
Albumin: 4.1 g/dL (ref 3.6–5.1)
Alkaline phosphatase (APISO): 70 U/L (ref 37–153)
BUN: 19 mg/dL (ref 7–25)
CO2: 31 mmol/L (ref 20–32)
Calcium: 10.4 mg/dL (ref 8.6–10.4)
Chloride: 106 mmol/L (ref 98–110)
Creat: 0.68 mg/dL (ref 0.60–0.88)
Globulin: 2.6 g/dL (calc) (ref 1.9–3.7)
Glucose, Bld: 85 mg/dL (ref 65–99)
Potassium: 4.4 mmol/L (ref 3.5–5.3)
Sodium: 141 mmol/L (ref 135–146)
Total Bilirubin: 0.5 mg/dL (ref 0.2–1.2)
Total Protein: 6.7 g/dL (ref 6.1–8.1)

## 2019-02-05 LAB — LIPID PANEL
Cholesterol: 191 mg/dL (ref <200)
HDL: 62 mg/dL (ref >=50)
LDL Cholesterol (Calc): 108 mg/dL (calc) — ABNORMAL HIGH
Non-HDL Cholesterol (Calc): 129 mg/dL (calc) (ref <130)
Total CHOL/HDL Ratio: 3.1 (calc) (ref <5.0)
Triglycerides: 118 mg/dL (ref <150)

## 2019-02-12 ENCOUNTER — Other Ambulatory Visit: Payer: Self-pay

## 2019-02-12 ENCOUNTER — Ambulatory Visit (INDEPENDENT_AMBULATORY_CARE_PROVIDER_SITE_OTHER): Payer: Medicare Other | Admitting: Nurse Practitioner

## 2019-02-12 ENCOUNTER — Encounter: Payer: Self-pay | Admitting: Nurse Practitioner

## 2019-02-12 DIAGNOSIS — E785 Hyperlipidemia, unspecified: Secondary | ICD-10-CM

## 2019-02-12 DIAGNOSIS — K219 Gastro-esophageal reflux disease without esophagitis: Secondary | ICD-10-CM | POA: Diagnosis not present

## 2019-02-12 DIAGNOSIS — K50019 Crohn's disease of small intestine with unspecified complications: Secondary | ICD-10-CM | POA: Diagnosis not present

## 2019-02-12 DIAGNOSIS — I119 Hypertensive heart disease without heart failure: Secondary | ICD-10-CM | POA: Diagnosis not present

## 2019-02-12 DIAGNOSIS — M8580 Other specified disorders of bone density and structure, unspecified site: Secondary | ICD-10-CM

## 2019-02-12 DIAGNOSIS — I5032 Chronic diastolic (congestive) heart failure: Secondary | ICD-10-CM | POA: Insufficient documentation

## 2019-02-12 DIAGNOSIS — E2839 Other primary ovarian failure: Secondary | ICD-10-CM

## 2019-02-12 NOTE — Progress Notes (Signed)
This service is provided via telemedicine  No vital signs collected/recorded due to the encounter was a telemedicine visit.   Location of patient (ex: home, work): Home  Patient consents to a telephone visit:  Yes  Location of the provider (ex: office, home): [facility], Office   Name of any referring provider:  N/A  Names of all persons participating in the telemedicine service and their role in the encounter:  S.Chrae B/CMA, Sherrie Mustache, NP, and Patient   Time spent on call:  6 min with medical assistant      Careteam: Patient Care Team: [name], NP as PCP - General (Geriatric Medicine) Renard Hamper, Marguerita Merles (Physician Assistant) Clent Jacks, MD as Consulting Physician (Ophthalmology) Rozetta Nunnery, MD as Consulting Physician (Otolaryngology)  Advanced Directive information Does Patient Have a Medical Advance Directive?: Yes, Type of Advance Directive: Out of facility DNR (pink MOST or yellow form), Pre-existing out of facility DNR order (yellow form or pink MOST form): Yellow form placed in chart (order not valid for inpatient use), Does patient want to make changes to medical advance directive?: No - Patient declined  Allergies  Allergen Reactions  . Shellfish Allergy Nausea And Vomiting    Chief Complaint  Patient presents with  . Medical Management of Chronic Issues    6 month follow-up and discuss labs (copy printed and will be mailed with AVS). Telephone visit      HPI: Patient is a 78 y.o. female for routine follow up.  Doing well. Tired of staying at home during the [place] pandemic.  Trying to maintain walking every day 15-30 mins.   Hypertension- 125/70 this morning, HR 63, on atenolol daily  Hyperlipidemia- continues on simvastatin. Not following dietary modifications, LDL at 108, triglycerides improved to 118 and total cholesterol 191  GERD- controlled on omeprazole. Occasional will have symptoms.  Temperature  97.4  Osteopenia- continues on calcium and vit d and weight bearing activity.   Review of Systems:  Review of Systems  Constitutional: Negative for chills, fever and malaise/fatigue.  HENT: Negative for congestion and hearing loss.   Eyes: Negative for blurred vision.  Respiratory: Negative for cough, sputum production and shortness of breath.   Cardiovascular: Negative for chest pain, palpitations and leg swelling.  Gastrointestinal: Negative for abdominal pain, blood in stool, constipation, diarrhea and melena.  Genitourinary: Negative for dysuria.  Musculoskeletal: Negative for falls.  Skin: Negative for itching and rash.  Neurological: Negative for dizziness and loss of consciousness.  Endo/Heme/Allergies: Does not bruise/bleed easily.  Psychiatric/Behavioral: Negative for depression and memory loss. The patient is not nervous/anxious and does not have insomnia.     Past Medical History:  Diagnosis Date  . Disorder of bone and cartilage, unspecified   . Disorders of bursae and tendons in shoulder region, unspecified   . Encounter for long-term (current) use of other medications   . Esophageal reflux   . Hyperosmolality and/or hypernatremia   . Osteoporosis, unspecified   . Other and unspecified hyperlipidemia   . Pain in joint, shoulder region   . Pain in limb   . Palpitations   . Pure hypercholesterolemia   . Reflux esophagitis   . Tachycardia, unspecified   . Unspecified essential hypertension   . Unspecified urinary incontinence    Past Surgical History:  Procedure Laterality Date  . APPENDECTOMY  1955  . Bone Density  08/14/2012  . CATARACT EXTRACTION Bilateral 2010  . CHOLECYSTECTOMY  2004  . COLONOSCOPY  03/31/2009   Diverticulosis, Dr.John Amedeo Plenty   . DIAGNOSTIC MAMMOGRAM    . LAPAROSCOPY N/A 02/15/2018   Procedure: LAPAROSCOPY DIAGNOSTIC, LYSIS OF ADHESION;  Surgeon: Ralene Ok, MD;  Location: [facility];  Service: General;  Laterality: N/A;   Social  History:   reports that she has never smoked. She has never used smokeless tobacco. She reports that she does not drink alcohol or use drugs.  Family History  Problem Relation Age of Onset  . Hypertension Mother   . Diabetes Brother   . Crohn's disease Neg Hx   . Inflammatory bowel disease Neg Hx     Medications: Patient's Medications  New Prescriptions   No medications on file  Previous Medications   ACETAMINOPHEN (TYLENOL) 325 MG TABLET    Take 325 mg by mouth every 6 (six) hours as needed for moderate pain.   ATENOLOL (TENORMIN) 25 MG TABLET    TAKE 1 TABLET BY MOUTH EVERYDAY AT BEDTIME   BIOTIN 5000 MCG CAPS    Take by mouth daily.   CHOLECALCIFEROL (VITAMIN D) 2000 UNITS TABLET    Take 2,000 Units by mouth daily.    OMEPRAZOLE (PRILOSEC) 20 MG CAPSULE    Take 1 capsule (20 mg total) by mouth daily.   SIMVASTATIN (ZOCOR) 40 MG TABLET    TAKE 1 TABLET BY MOUTH DAILY AT 6:00PM  Modified Medications   No medications on file  Discontinued Medications   No medications on file    Physical Exam:  There were no vitals filed for this visit. There is no height or weight on file to calculate BMI. Wt Readings from Last 3 Encounters:  08/20/18 150 lb 12.8 oz (68.4 kg)  08/13/18 150 lb 12.8 oz (68.4 kg)  04/16/18 143 lb (64.9 kg)      Labs reviewed: Basic Metabolic Panel: Recent Labs    02/16/18 0422 02/17/18 0459 02/18/18 0344  02/28/18 1554 08/07/18 0801 02/05/19 0802  NA 137 137 134*   < > 135 141 141  K 3.8 4.1 4.2   < > 3.8 4.7 4.4  CL 102 103 105   < > 99 105 106  CO2 28 27 24    < > 25 29 31   GLUCOSE 112* 110* 109*   < > 80 79 85  BUN 19 19 14    < > 10 17 19   CREATININE 0.81 0.64 0.61   < > 0.58* 0.70 0.68  CALCIUM 9.6 9.5 9.2   < > 9.8 10.4 10.4  MG 2.2 2.2 2.0  --   --   --   --    < > = values in this interval not displayed.   Liver Function Tests: Recent Labs    02/28/18 1554 08/07/18 0801 02/05/19 0802  AST 11 14 14   ALT 10 9 9   BILITOT 0.6 0.5  0.5  PROT 5.8* 6.3 6.7   Recent Labs    02/28/18 1554  LIPASE <5*  AMYLASE 16*   No results for input(s): AMMONIA in the last 8760 hours. CBC: Recent Labs    02/28/18 1554 03/05/18 1000 08/07/18 0801  WBC 14.8* 6.9 5.5  NEUTROABS 12,373* 4,885 3,157  HGB 11.0* 10.6* 11.8  HCT 30.8* 31.6* 35.1  MCV 88.8 90.3 86.9  PLT 346 435* 313   Lipid Panel: Recent Labs    08/07/18 0801 02/05/19 0802  CHOL 207* 191  HDL 64 62  LDLCALC 115* 108*  TRIG 160* 118  CHOLHDL 3.2 3.1   TSH: No results  for input(s): TSH in the last 8760 hours. A1C: No results found for: HGBA1C   Assessment/Plan 1. Benign hypertensive heart disease without heart failure Stable on atenolol. Continue dietary modifications.   2. Gastroesophageal reflux disease without esophagitis -stable, encouraged dietary compliance with omeprazole.   3. Osteopenia, unspecified location -continues on calcium and vit d with weight bearing activity  - DG Bone Density; Future  4. Estrogen deficiency - DG Bone Density; Future  5. Chronic diastolic (congestive) heart failure (HCC) Stable, without worsening swelling, shortness of breath or chest pains. Continues on atenolol.   6. Crohn's disease of small intestine with unspecified complications ([place]) Stable without symptoms.   7. Hyperlipidemia with target LDL less than 130 LDL 108, continues on zocor, encouraged dietary modifications, pt continues to exercise daily with walking  Next appt: 6 months for routine follow up.  Carlos American. Harle Battiest  [facility] [phone]    Virtual Visit via Telephone Note  I connected with pt on 02/12/19 at  8:30 AM EDT by telephone and verified that I am speaking with the correct person using two identifiers.  Location: Patient: home Provider: office   I discussed the limitations, risks, security and privacy concerns of performing an evaluation and management service by telephone and the  availability of in person appointments. I also discussed with the patient that there may be a patient responsible charge related to this service. The patient expressed understanding and agreed to proceed.   I discussed the assessment and treatment plan with the patient. The patient was provided an opportunity to ask questions and all were answered. The patient agreed with the plan and demonstrated an understanding of the instructions.   The patient was advised to call back or seek an in-person evaluation if the symptoms worsen or if the condition fails to improve as anticipated.  I provided 25 minutes of non-face-to-face time during this encounter.  Carlos American. Harle Battiest Avs printed and mailed

## 2019-02-12 NOTE — Patient Instructions (Signed)

## 2019-03-22 IMAGING — CT CT ABD-PELV W/ CM
2 of 4 series · 16 of 46 positions shown, 18 images · IV contrast (APPLIED)
Comparison: 02/08/2018

CLINICAL DATA: Abdominal pain and bloating. Follow-up small bowel
obstruction.

EXAM:
CT ABDOMEN AND PELVIS WITH CONTRAST
TECHNIQUE: Multidetector CT imaging of the abdomen and pelvis was performed
using the standard protocol following bolus administration of
intravenous contrast.
CONTRAST:  100mL [id number] IOPAMIDOL (RFSKGB-[id number]) INJECTION 61%

[Series 2: axial st · axial · 0.77mm/px · z∈[-405,-35]mm · 13 of 82 slices shown, 15 images]
[im 4/82  soft-tissue]
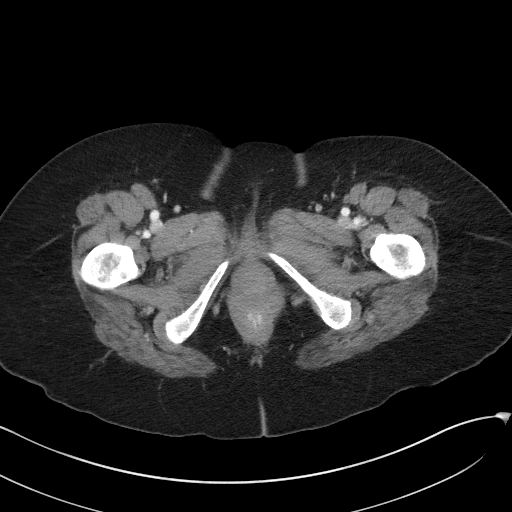
[im 4/82  bone]
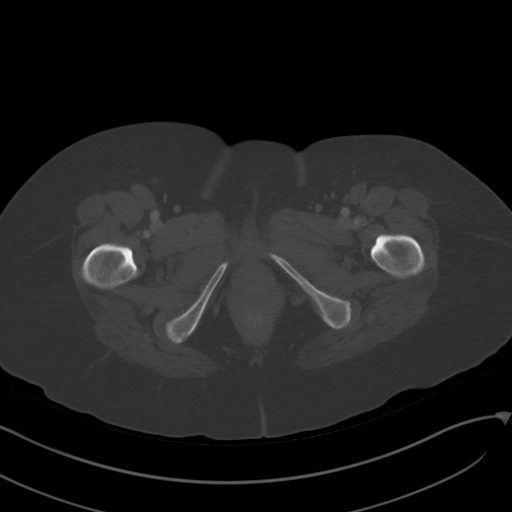
[im 12/82  soft-tissue]
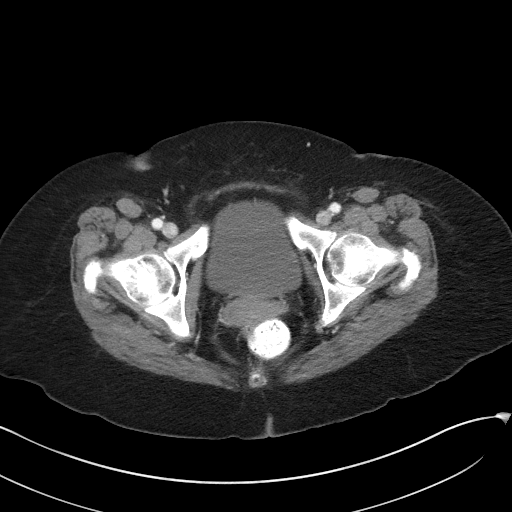
[im 16/82  soft-tissue]
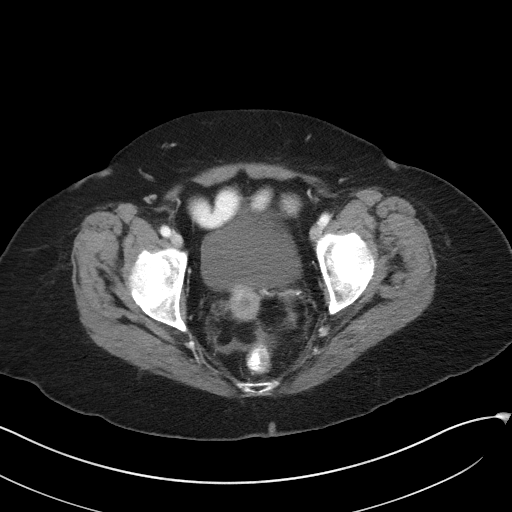
[im 24/82  soft-tissue]
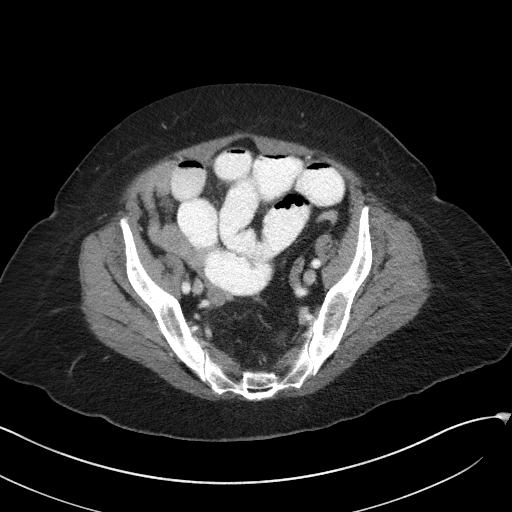
[im 28/82  soft-tissue]
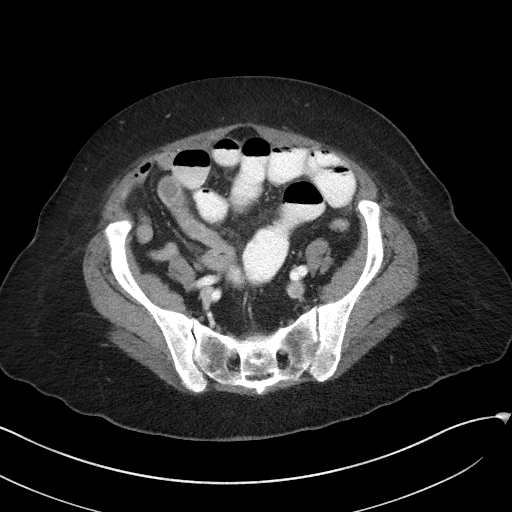
[im 35/82  soft-tissue]
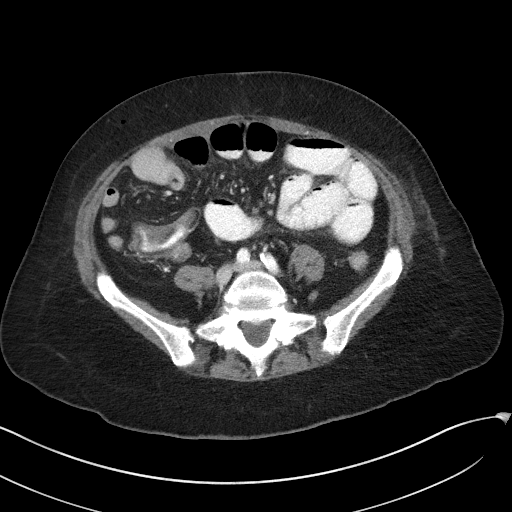
[im 43/82  soft-tissue]
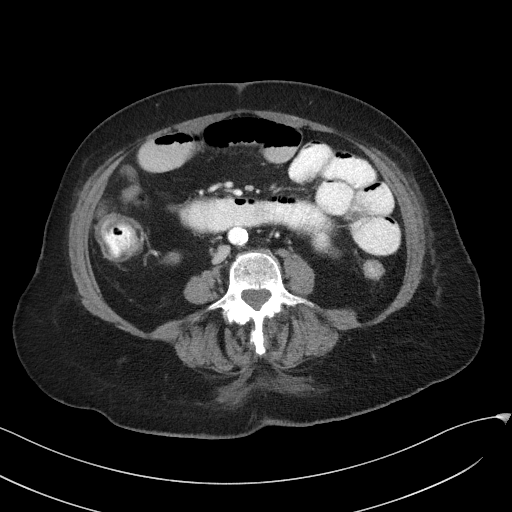
[im 47/82  soft-tissue]
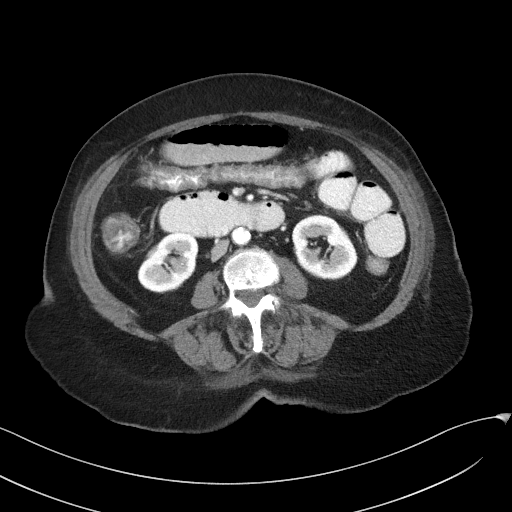
[im 55/82  soft-tissue]
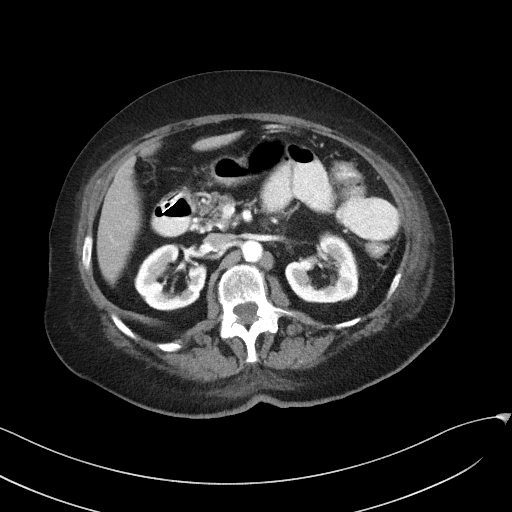
[im 55/82  bone]
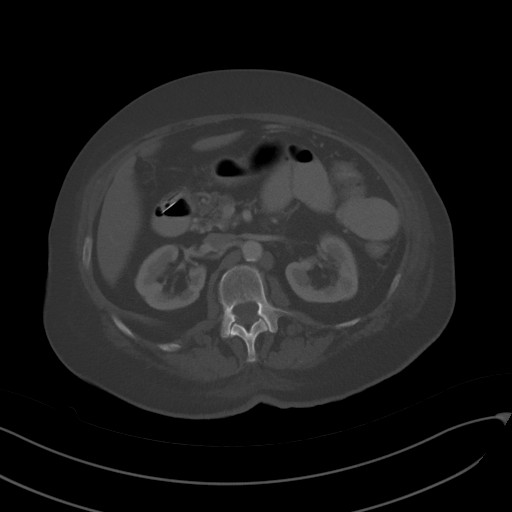
[im 58/82  soft-tissue]
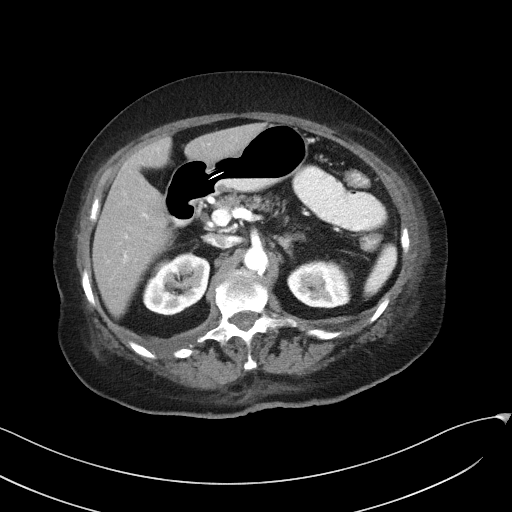
[im 66/82  soft-tissue]
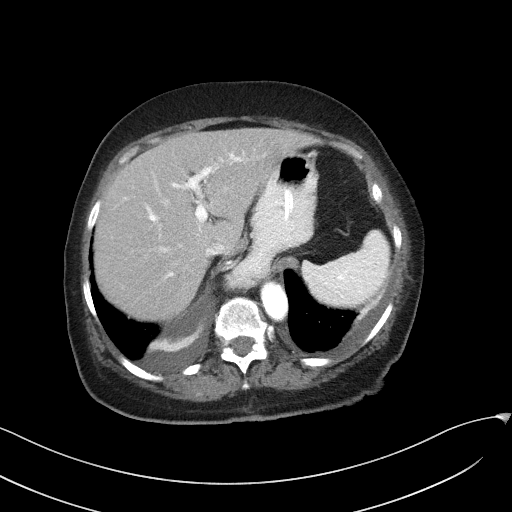
[im 70/82  soft-tissue]
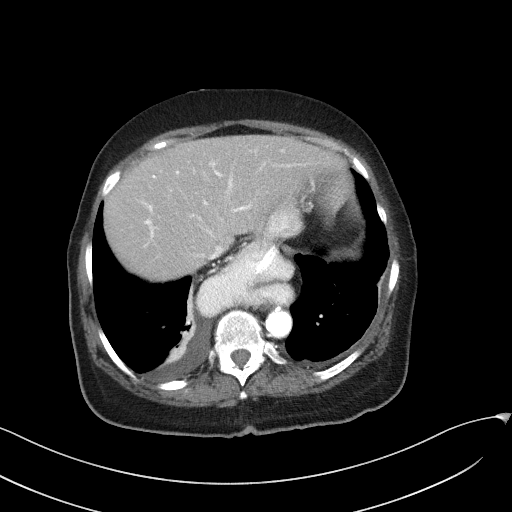
[im 78/82  soft-tissue]
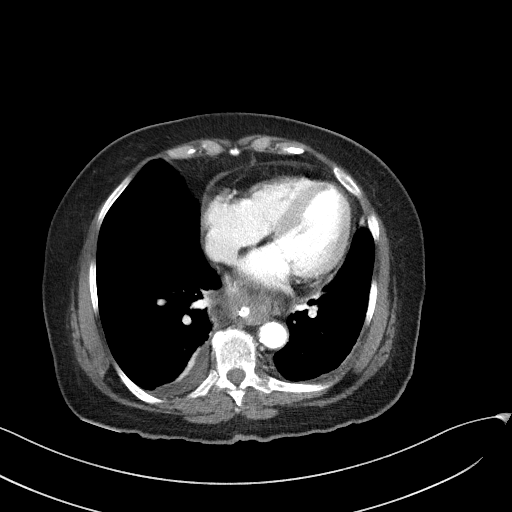

[Series 5: coronal st · coronal · 0.67mm/px · 3 of 90 slices shown]
[im 30/90  soft-tissue]
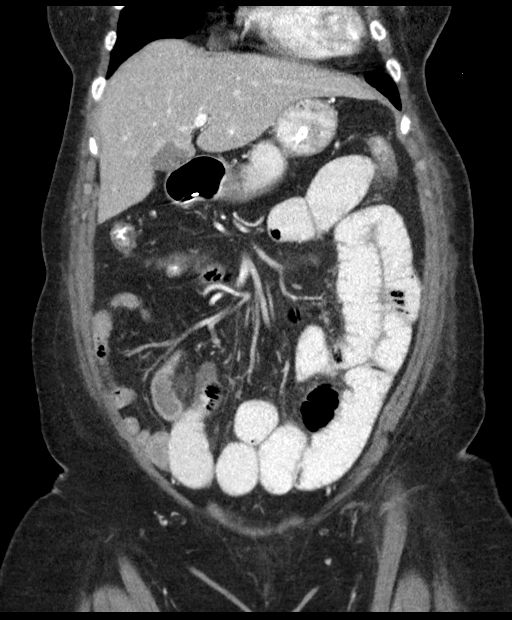
[im 40/90  soft-tissue]
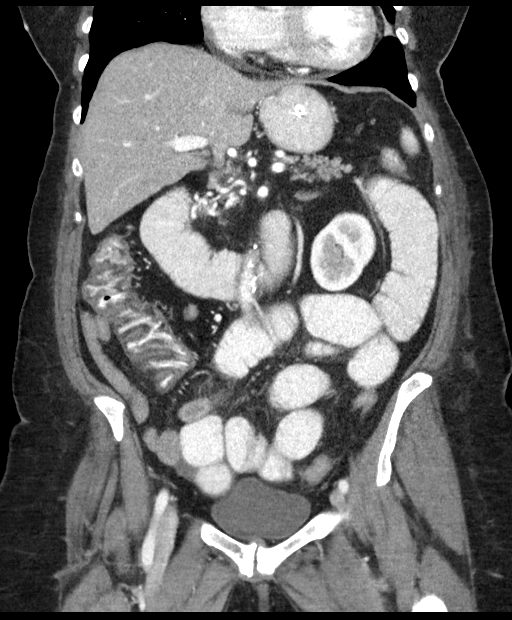
[im 50/90  soft-tissue]
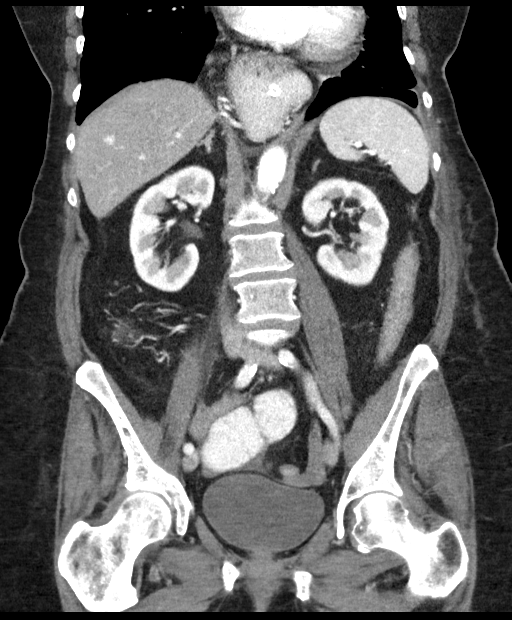

[16 of 46 positions shown; findings below may reference images not displayed]

FINDINGS: Lower chest: New small bilateral pleural effusions and mild right
lower lobe atelectasis.

Hepatobiliary: 2.7 cm benign hemangioma is seen in segment 4B
adjacent to the porta hepatis. A 1 cm flash-filling hemangioma is
seen in segment 7. These are stable since previous study. No new or
enlarging liver lesions are identified. Prior cholecystectomy. No
evidence of biliary obstruction.

Pancreas:  No mass or inflammatory changes.

Spleen:  Within normal limits in size and appearance.

Adrenals/Urinary Tract: No masses identified. No evidence of
hydronephrosis.

Stomach/Bowel: A large hiatal hernia is again seen. A nasogastric
tube is now seen with tip in the proximal duodenum. There is
persistent moderate dilatation of small bowel loops, with transition
point in the anterior right lower quadrant. No mass or inflammatory
process seen in this region. This is suspicious for adhesion. Distal
small bowel and colon are nondistended.

Vascular/Lymphatic: No pathologically enlarged lymph nodes
identified. No abdominal aortic aneurysm. Aortic atherosclerosis.

Reproductive: Small postmenopausal uterus is again seen, with
diffuse thickening of the endometrium measuring approximately 15 mm.
No evidence of extra uterine extension. No adnexal mass identified.
Tiny amount of free fluid seen in the dependent pelvis.

Other:  None.

Musculoskeletal:  No suspicious bone lesions identified.
IMPRESSION: Persistent partial small bowel obstruction, with transition point in
the anterior right lower quadrant, likely due to adhesion.

New small bilateral pleural effusions and minimal ascites in pelvis.

Large hiatal hernia.  Nasogastric tube tip in proximal duodenum.

Persistent abnormal endometrial thickening. Endometrial carcinoma
cannot be excluded in a postmenopausal female. Recommend GYN
consultation for endometrial sampling.

Small benign hepatic hemangiomas.

## 2019-03-26 ENCOUNTER — Other Ambulatory Visit: Payer: Self-pay | Admitting: Nurse Practitioner

## 2019-05-07 DIAGNOSIS — R29890 Loss of height: Secondary | ICD-10-CM | POA: Diagnosis not present

## 2019-05-07 DIAGNOSIS — Z1231 Encounter for screening mammogram for malignant neoplasm of breast: Secondary | ICD-10-CM | POA: Diagnosis not present

## 2019-05-07 DIAGNOSIS — M8589 Other specified disorders of bone density and structure, multiple sites: Secondary | ICD-10-CM | POA: Diagnosis not present

## 2019-05-07 LAB — HM DEXA SCAN

## 2019-05-07 LAB — HM MAMMOGRAPHY

## 2019-05-08 ENCOUNTER — Telehealth: Payer: Self-pay

## 2019-05-08 ENCOUNTER — Encounter: Payer: Self-pay | Admitting: *Deleted

## 2019-05-08 NOTE — Telephone Encounter (Signed)
Spoke with patient, patient aware to increase calcium

## 2019-05-08 NOTE — Telephone Encounter (Signed)
Per [name], NP patient with osteopenia. Patient to continue calcium, vit d and weight bearing exercises.  I spoke with patient, patient verbalized understanding of results. Patient states she is taking calcium 600 mg daily (updated medication list) and vit d as listed on medication list. Patient questions if she needs to take more calcium   Please advise

## 2019-05-08 NOTE — Telephone Encounter (Signed)
To increase calcium to twice daily.

## 2019-05-13 ENCOUNTER — Encounter: Payer: Self-pay | Admitting: *Deleted

## 2019-05-17 ENCOUNTER — Encounter: Payer: Self-pay | Admitting: Nurse Practitioner

## 2019-05-17 DIAGNOSIS — N6311 Unspecified lump in the right breast, upper outer quadrant: Secondary | ICD-10-CM | POA: Diagnosis not present

## 2019-05-17 DIAGNOSIS — N6001 Solitary cyst of right breast: Secondary | ICD-10-CM | POA: Diagnosis not present

## 2019-05-17 LAB — HM MAMMOGRAPHY

## 2019-05-22 ENCOUNTER — Encounter: Payer: Self-pay | Admitting: *Deleted

## 2019-06-17 DIAGNOSIS — L820 Inflamed seborrheic keratosis: Secondary | ICD-10-CM | POA: Diagnosis not present

## 2019-06-17 DIAGNOSIS — D225 Melanocytic nevi of trunk: Secondary | ICD-10-CM | POA: Diagnosis not present

## 2019-07-09 ENCOUNTER — Other Ambulatory Visit: Payer: Self-pay

## 2019-07-09 ENCOUNTER — Ambulatory Visit (INDEPENDENT_AMBULATORY_CARE_PROVIDER_SITE_OTHER): Payer: Medicare Other

## 2019-07-09 DIAGNOSIS — Z23 Encounter for immunization: Secondary | ICD-10-CM

## 2019-08-23 ENCOUNTER — Encounter: Payer: Self-pay | Admitting: Nurse Practitioner

## 2019-08-23 ENCOUNTER — Ambulatory Visit (INDEPENDENT_AMBULATORY_CARE_PROVIDER_SITE_OTHER): Payer: Medicare Other | Admitting: Nurse Practitioner

## 2019-08-23 ENCOUNTER — Other Ambulatory Visit: Payer: Self-pay

## 2019-08-23 VITALS — BP 117/71 | HR 68 | Temp 97.0°F

## 2019-08-23 DIAGNOSIS — E785 Hyperlipidemia, unspecified: Secondary | ICD-10-CM

## 2019-08-23 DIAGNOSIS — I119 Hypertensive heart disease without heart failure: Secondary | ICD-10-CM | POA: Diagnosis not present

## 2019-08-23 DIAGNOSIS — M8580 Other specified disorders of bone density and structure, unspecified site: Secondary | ICD-10-CM | POA: Diagnosis not present

## 2019-08-23 DIAGNOSIS — K219 Gastro-esophageal reflux disease without esophagitis: Secondary | ICD-10-CM | POA: Diagnosis not present

## 2019-08-23 DIAGNOSIS — E669 Obesity, unspecified: Secondary | ICD-10-CM

## 2019-08-23 DIAGNOSIS — Z0000 Encounter for general adult medical examination without abnormal findings: Secondary | ICD-10-CM

## 2019-08-23 MED ORDER — SIMVASTATIN 40 MG PO TABS: ORAL_TABLET | ORAL | 1 refills | Status: DC

## 2019-08-23 MED ORDER — ATENOLOL 25 MG PO TABS: ORAL_TABLET | ORAL | 1 refills | Status: DC

## 2019-08-23 NOTE — Progress Notes (Signed)
Subjective:   Brooke Cox is a 78 y.o. female who presents for Medicare Annual (Subsequent) preventive examination.  Review of Systems:   Cardiac Risk Factors include: advanced age (>4mn, >>35women);dyslipidemia;hypertension;sedentary lifestyle;obesity (BMI >30kg/m2)     Objective:     Vitals: There were no vitals taken for this visit.  There is no height or weight on file to calculate BMI.  Advanced Directives 08/23/2019 02/12/2019 08/20/2018 08/13/2018 03/14/2018 02/28/2018 02/26/2018  Does Patient Have a Medical Advance Directive? Yes Yes No Yes Yes Yes Yes  Type of Advance Directive Out of facility DNR (pink MOST or yellow form) Out of facility DNR (pink MOST or yellow form) - Out of facility DNR (pink MOST or yellow form) Out of facility DNR (pink MOST or yellow form) Out of facility DNR (pink MOST or yellow form) Out of facility DNR (pink MOST or yellow form)  Does patient want to make changes to medical advance directive? No - Patient declined No - Patient declined - No - Patient declined - - -  [place] Would patient like information on creating a medical advance directive? - - Yes (MAU/Ambulatory/Procedural Areas - Information given) - - - -  Pre-existing out of facility DNR order (yellow form or pink MOST form) - Yellow form placed in chart (order not valid for inpatient use) - Yellow form placed in chart (order not valid for inpatient use) Yellow form placed in chart (order not valid for inpatient use) Yellow form placed in chart (order not valid for inpatient use) Yellow form placed in chart (order not valid for inpatient use)    Tobacco Social History   Tobacco Use  Smoking Status Never Smoker  Smokeless Tobacco Never Used     Counseling given: Not Answered   Clinical Intake:  Pre-visit preparation completed: Yes  Pain : No/denies pain     BMI - recorded: 31.52 Nutritional Status: BMI > 30  Obese Nutritional  Risks: None Diabetes: No  How often do you need to have someone help you when you read instructions, pamphlets, or other written materials from your doctor or pharmacy?: 1 - Never What is the last grade level you completed in school?: 12th grade  Interpreter Needed?: No     Past Medical History:  Diagnosis Date  . Disorder of bone and cartilage, unspecified   . Disorders of bursae and tendons in shoulder region, unspecified   . Encounter for long-term (current) use of other medications   . Esophageal reflux   . Hyperosmolality and/or hypernatremia   . Osteoporosis, unspecified   . Other and unspecified hyperlipidemia   . Pain in joint, shoulder region   . Pain in limb   . Palpitations   . Pure hypercholesterolemia   . Reflux esophagitis   . Tachycardia, unspecified   . Unspecified essential hypertension   . Unspecified urinary incontinence    Past Surgical History:  Procedure Laterality Date  . APPENDECTOMY  1955  . Bone Density  08/14/2012  . CATARACT EXTRACTION Bilateral 2010  . CHOLECYSTECTOMY  2004  . COLONOSCOPY  03/31/2009   Diverticulosis, Dr.John HAmedeo Plenty  . DIAGNOSTIC MAMMOGRAM    . LAPAROSCOPY N/A 02/15/2018   Procedure: LAPAROSCOPY DIAGNOSTIC, LYSIS OF ADHESION;  Surgeon: RRalene Ok MD;  Location: [facility];  Service: General;  Laterality: N/A;   Family History  Problem Relation Age of Onset  . Hypertension Mother   . Diabetes Brother   .  Crohn's disease Neg Hx   . Inflammatory bowel disease Neg Hx    Social History   Socioeconomic History  . Marital status: Married    Spouse name: Not on file  . Number of children: Not on file  . Years of education: Not on file  . Highest education level: Not on file  Occupational History  . Occupation: retired  Tobacco Use  . Smoking status: Never Smoker  . Smokeless tobacco: Never Used  Substance and Sexual Activity  . Alcohol use: No  . Drug use: No  . Sexual activity: Not Currently  Other Topics Concern    . Not on file  Social History Narrative  . Not on file   Social Determinants of Health   Financial Resource Strain:   . Difficulty of Paying Living Expenses: Not on file  Food Insecurity:   . Worried About Charity fundraiser in the Last Year: Not on file  . Ran Out of Food in the Last Year: Not on file  Transportation Needs:   . Lack of Transportation (Medical): Not on file  . Lack of Transportation (Non-Medical): Not on file  Physical Activity:   . Days of Exercise per Week: Not on file  . Minutes of Exercise per Session: Not on file  Stress:   . Feeling of Stress : Not on file  Social Connections:   . Frequency of Communication with Friends and Family: Not on file  . Frequency of Social Gatherings with Friends and Family: Not on file  . Attends Religious Services: Not on file  . Active Member of Clubs or Organizations: Not on file  . Attends Archivist Meetings: Not on file  . Marital Status: Not on file    Outpatient Encounter Medications as of 08/23/2019  Medication Sig  . acetaminophen (TYLENOL) 325 MG tablet Take 325 mg by mouth every 6 (six) hours as needed for moderate pain.  Marland Kitchen aspirin EC 81 MG tablet Take 81 mg by mouth daily.  Marland Kitchen atenolol (TENORMIN) 25 MG tablet TAKE 1 TABLET BY MOUTH EVERYDAY AT BEDTIME  . Biotin 5000 MCG CAPS Take by mouth daily.  . calcium carbonate (CALCIUM 600) 600 MG TABS tablet Take 600 mg by mouth 2 (two) times daily with a meal.   . Cholecalciferol (VITAMIN D) 2000 UNITS tablet Take 2,000 Units by mouth daily.   Marland Kitchen omeprazole (PRILOSEC) 20 MG capsule Take 1 capsule (20 mg total) by mouth daily.  . simvastatin (ZOCOR) 40 MG tablet TAKE 1 TABLET BY MOUTH DAILY AT 6:00PM   No facility-administered encounter medications on file as of 08/23/2019.    Activities of Daily Living In your present state of health, do you have any difficulty performing the following activities: 08/23/2019  Hearing? N  Vision? N  Difficulty concentrating  or making decisions? N  Walking or climbing stairs? N  Dressing or bathing? N  Doing errands, shopping? N  Preparing Food and eating ? N  Using the Toilet? N  In the past six months, have you accidently leaked urine? N  Do you have problems with loss of bowel control? N  Managing your Medications? N  Managing your Finances? N  Housekeeping or managing your Housekeeping? N  Some recent data might be hidden    Patient Care Team: [name], NP as PCP - General (Geriatric Medicine) Renard Hamper, Marguerita Merles (Physician Assistant) Clent Jacks, MD as Consulting Physician (Ophthalmology) Rozetta Nunnery, MD as Consulting Physician (Otolaryngology)  Assessment:   This is a routine wellness examination for Brooke Cox.  Exercise Activities and Dietary recommendations Current Exercise Habits: Home exercise routine, Type of exercise: walking, Time (Minutes): 30, Frequency (Times/Week): 4, Weekly Exercise (Minutes/Week): 120  Goals    . DIET - INCREASE WATER INTAKE     Increase water to 4-5 cups a day.    . Increase water intake     Starting 08/01/16, I will increase my water intake from 3-4 glasses per day to 8 glasses per day.        Fall Risk Fall Risk  08/23/2019 02/12/2019 08/20/2018 08/13/2018 04/16/2018  Falls in the past year? 0 0 0 0 No  Number falls in past yr: 0 0 - 0 -  Injury with Fall? 0 0 - 0 -   Is the patient's home free of loose throw rugs in walkways, pet beds, electrical cords, etc?   yes      Grab bars in the bathroom? no      Handrails on the stairs?   yes      Adequate lighting?   yes  Timed Get Up and Go performed: na  Depression Screen PHQ 2/9 Scores 08/23/2019 02/12/2019 08/20/2018 08/07/2017  PHQ - 2 Score 0 0 0 0     Cognitive Function MMSE - Mini Mental State Exam 08/20/2018 08/07/2017 08/01/2016 07/30/2015 07/23/2014  Orientation to time 5 5 5 5 5   Orientation to Place 5 5 5 5 5   Registration 3 3 3 3 3   Attention/ Calculation 5 5 5 5 5     Recall 3 2 3 3 3   Language- name 2 objects 2 2 2 2 2   Language- repeat 1 1 1 1 1   Language- follow 3 step command 3 3 3 3 3   Language- read & follow direction 1 1 1 1 1   Write a sentence 1 1 1 1 1   Copy design 1 1 1 1 1   Total score 30 29 30 30 30      6CIT Screen 08/23/2019  What Year? 0 points  What month? 0 points  What time? 0 points  Count back from 20 0 points  Months in reverse 0 points  Repeat phrase 2 points  Total Score 2    Immunization History  Administered Date(s) Administered  . Fluad Quad(high Dose 65+) 07/09/2019  . Influenza, High Dose Seasonal PF 07/12/2017, 06/22/2018  . Influenza,inj,Quad PF,6+ Mos 07/03/2013, 07/30/2015, 08/01/2016  . Influenza-Unspecified 07/03/2012, 06/12/2014  . Pneumococcal Conjugate-13 07/23/2014  . Pneumococcal Polysaccharide-23 02/20/2008  . Tdap 07/05/2011  . Zoster 07/28/2011  . Zoster Recombinat (Shingrix) 09/06/2018, 03/27/2019    Qualifies for Shingles Vaccine?yes  Screening Tests Health Maintenance  Topic Date Due  . MAMMOGRAM  05/16/2020  . TETANUS/TDAP  07/04/2021  . INFLUENZA VACCINE  Completed  . DEXA SCAN  Completed  . PNA vac Low Risk Adult  Completed    Cancer Screenings: Lung: Low Dose CT Chest recommended if Age 28-80 years, 30 pack-year currently smoking OR have quit w/in 15years. Patient does not qualify. Breast:  Up to date on Mammogram? Yes   Up to date of Bone Density/Dexa? Yes Colorectal: aged out  Additional Screenings: na Hepatitis C Screening:      Plan:     I have personally reviewed and noted the following in the patient's chart:   . Medical and social history . Use of alcohol, tobacco or illicit drugs  . Current medications and supplements . Functional ability and status . Nutritional  status . Physical activity . Advanced directives . List of other physicians . Hospitalizations, surgeries, and ER visits in previous 12 months . Vitals . Screenings to include cognitive,  depression, and falls . Referrals and appointments  In addition, I have reviewed and discussed with patient certain preventive protocols, quality metrics, and best practice recommendations. A written personalized care plan for preventive services as well as general preventive health recommendations were provided to patient.     [name], NP  08/23/2019

## 2019-08-23 NOTE — Progress Notes (Signed)
Patient ID: Brooke Cox, female   DOB: [birth year], 78 y.o.   MRN: [id number]   This service is provided via telemedicine  No vital signs collected/recorded due to the encounter was a telemedicine visit.   Location of patient (ex: home, work): Home  Patient consents to a telephone visit:  Yes  Location of the provider (ex: office, home):[facility], Office   Name of any referring provider:  N/A  Names of all persons participating in the telemedicine service and their role in the encounter:  S.Chrae B/CMA, Sherrie Mustache, NP, and Patient   Time spent on call:  9 min with medical assistant     Careteam: Patient Care Team: [name], NP as PCP - General (Geriatric Medicine) Renard Hamper Marguerita Merles (Physician Assistant) Clent Jacks, MD as Consulting Physician (Ophthalmology) Rozetta Nunnery, MD as Consulting Physician (Otolaryngology)  Advanced Directive information    Allergies  Allergen Reactions  . Shellfish Allergy Nausea And Vomiting    Chief Complaint  Patient presents with  . Medical Management of Chronic Issues    6 month follow-up. Telephone visit   . Medication Refill    Refill Atenolol and Zocor      HPI: Patient is a 78 y.o. female for routine follow up via televisit  GERD- continues to have acid reflux- has changed the time to 4 pm on omeprazole- taking before dinnertime has significantly helped.    Hypertension- well controlled on atenolol. No issues with headache, chest pains, shortness of breath or swelling.   Hyperlipidemia- tries to eat heart healthy diet- contines on zocor  Osteopenia- cal and vit d, with weight bearing exercises walks 30 mins 4 days a week.    Review of Systems:  Review of Systems  Constitutional: Negative for chills, fever and weight loss.  HENT: Negative for tinnitus.   Respiratory: Negative for cough, sputum production and shortness of breath.   Cardiovascular: Negative for chest pain,  palpitations and leg swelling.  Gastrointestinal: Positive for heartburn (improved with moving omeprazole to 4 pm). Negative for abdominal pain, constipation and diarrhea.  Genitourinary: Positive for frequency (chronic frequency). Negative for dysuria and urgency.  Musculoskeletal: Negative for back pain, falls, joint pain and myalgias.  Skin: Negative.   Neurological: Negative for dizziness and headaches.  Psychiatric/Behavioral: Negative for depression and memory loss. The patient does not have insomnia.     Past Medical History:  Diagnosis Date  . Disorder of bone and cartilage, unspecified   . Disorders of bursae and tendons in shoulder region, unspecified   . Encounter for long-term (current) use of other medications   . Esophageal reflux   . Hyperosmolality and/or hypernatremia   . Osteoporosis, unspecified   . Other and unspecified hyperlipidemia   . Pain in joint, shoulder region   . Pain in limb   . Palpitations   . Pure hypercholesterolemia   . Reflux esophagitis   . Tachycardia, unspecified   . Unspecified essential hypertension   . Unspecified urinary incontinence    Past Surgical History:  Procedure Laterality Date  . APPENDECTOMY  1955  . Bone Density  08/14/2012  . CATARACT EXTRACTION Bilateral 2010  . CHOLECYSTECTOMY  2004  . COLONOSCOPY  03/31/2009   Diverticulosis, Dr.John Amedeo Plenty   . DIAGNOSTIC MAMMOGRAM    . LAPAROSCOPY N/A 02/15/2018   Procedure: LAPAROSCOPY DIAGNOSTIC, LYSIS OF ADHESION;  Surgeon: Ralene Ok, MD;  Location: [facility];  Service: General;  Laterality: N/A;   Social History:   reports that  she has never smoked. She has never used smokeless tobacco. She reports that she does not drink alcohol or use drugs.  Family History  Problem Relation Age of Onset  . Hypertension Mother   . Diabetes Brother   . Crohn's disease Neg Hx   . Inflammatory bowel disease Neg Hx     Medications: Patient's Medications  New Prescriptions   No  medications on file  Previous Medications   ACETAMINOPHEN (TYLENOL) 325 MG TABLET    Take 325 mg by mouth every 6 (six) hours as needed for moderate pain.   ASPIRIN EC 81 MG TABLET    Take 81 mg by mouth daily.   ATENOLOL (TENORMIN) 25 MG TABLET    TAKE 1 TABLET BY MOUTH EVERYDAY AT BEDTIME   BIOTIN 5000 MCG CAPS    Take by mouth daily.   CALCIUM CARBONATE (CALCIUM 600) 600 MG TABS TABLET    Take 600 mg by mouth 2 (two) times daily with a meal.    CHOLECALCIFEROL (VITAMIN D) 2000 UNITS TABLET    Take 2,000 Units by mouth daily.    OMEPRAZOLE (PRILOSEC) 20 MG CAPSULE    Take 1 capsule (20 mg total) by mouth daily.   SIMVASTATIN (ZOCOR) 40 MG TABLET    TAKE 1 TABLET BY MOUTH DAILY AT 6:00PM  Modified Medications   No medications on file  Discontinued Medications   No medications on file    Physical Exam:  There were no vitals filed for this visit. There is no height or weight on file to calculate BMI. Wt Readings from Last 3 Encounters:  08/20/18 150 lb 12.8 oz (68.4 kg)  08/13/18 150 lb 12.8 oz (68.4 kg)  04/16/18 143 lb (64.9 kg)      Labs reviewed: Basic Metabolic Panel: Recent Labs    02/05/19 0802  NA 141  K 4.4  CL 106  CO2 31  GLUCOSE 85  BUN 19  CREATININE 0.68  CALCIUM 10.4   Liver Function Tests: Recent Labs    02/05/19 0802  AST 14  ALT 9  BILITOT 0.5  PROT 6.7   No results for input(s): LIPASE, AMYLASE in the last 8760 hours. No results for input(s): AMMONIA in the last 8760 hours. CBC: No results for input(s): WBC, NEUTROABS, HGB, HCT, MCV, PLT in the last 8760 hours. Lipid Panel: Recent Labs    02/05/19 0802  CHOL 191  HDL 62  LDLCALC 108*  TRIG 118  CHOLHDL 3.1   TSH: No results for input(s): TSH in the last 8760 hours. A1C: No results found for: HGBA1C   Assessment/Plan 1. Osteopenia, unspecified location -dexa up to date. Continues on cal and vit d, will follow up blood work in office next week. Encouraged to continue weight  bearing exercises.  2. Obesity (BMI 30.0-34.9) Noted, encouraged to continue to reduce caloric intake and increase physical activity  3. Gastroesophageal reflux disease without esophagitis Improved with changing omeprazole to 4 pm. Continue to encourage lifestyle modifications.  4. Benign hypertensive heart disease without heart failure Stable on current regimen with diet and atenolol - atenolol (TENORMIN) 25 MG tablet; TAKE 1 TABLET BY MOUTH EVERYDAY AT BEDTIME  Dispense: 90 tablet; Refill: 1 - CBC with Differential/Platelet; Future  5. Hyperlipidemia with target LDL less than 130 -continues to try to eat healthy with maintaining physical activity such as walking - simvastatin (ZOCOR) 40 MG tablet; TAKE 1 TABLET BY MOUTH DAILY AT 6:00PM  Dispense: 90 tablet; Refill: 1 - COMPLETE METABOLIC  PANEL WITH GFR; Future - Lipid panel; Future  Next appt: 6 months, plans to come in next week for lab work [employer]. Harle Battiest  [facility] [phone]    Virtual Visit via Telephone Note  I connected with pt on 08/23/19 at 10:30 AM EST by telephone and verified that I am speaking with the correct person using two identifiers.  Location: Patient: home Provider: office   I discussed the limitations, risks, security and privacy concerns of performing an evaluation and management service by telephone and the availability of in person appointments. I also discussed with the patient that there may be a patient responsible charge related to this service. The patient expressed understanding and agreed to proceed.   I discussed the assessment and treatment plan with the patient. The patient was provided an opportunity to ask questions and all were answered. The patient agreed with the plan and demonstrated an understanding of the instructions.   The patient was advised to call back or seek an in-person evaluation if the symptoms worsen or if the condition fails to  improve as anticipated.  I provided 16 minutes of non-face-to-face time during this encounter.  Carlos American. Harle Battiest Avs printed and mailed

## 2019-08-23 NOTE — Progress Notes (Signed)
Patient ID: Brooke Cox, female   DOB: 1941-01-30, 78 y.o.   MRN: [id number]    This service is provided via telemedicine  No vital signs collected/recorded due to the encounter was a telemedicine visit.   Location of patient (ex: home, work):  Home  Patient consents to a telephone visit:  Yes  Location of the provider (ex: office, home):  [facility], Office   Name of any referring provider:  N/A  Names of all persons participating in the telemedicine service and their role in the encounter:  S.Chrae B/CMA, Sherrie Mustache, NP, and Patient   Time spent on call:  9 min with medical assistant

## 2019-08-23 NOTE — Patient Instructions (Signed)
Brooke Cox , Thank you for taking time to come for your Medicare Wellness Visit. I appreciate your ongoing commitment to your health goals. Please review the following plan we discussed and let me know if I can assist you in the future.   Screening recommendations/referrals: Colonoscopy aged out Mammogram up todate Bone Density up to date Recommended yearly ophthalmology/optometry visit for glaucoma screening and checkup Recommended yearly dental visit for hygiene and checkup  Vaccinations: Influenza vaccine up to date Pneumococcal vaccine up to date Tdap vaccine up to date Shingles vaccine up to date    Advanced directives: on file  Conditions/risks identified: obesity noted by BMI, recommended weight loss by healthy eating and increase in physical activity   Next appointment: 1 year   Preventive Care 65 Years and Older, Female Preventive care refers to lifestyle choices and visits with your health care provider that can promote health and wellness. What does preventive care include?  A yearly physical exam. This is also called an annual well check.  Dental exams once or twice a year.  Routine eye exams. Ask your health care provider how often you should have your eyes checked.  Personal lifestyle choices, including:  Daily care of your teeth and gums.  Regular physical activity.  Eating a healthy diet.  Avoiding tobacco and drug use.  Limiting alcohol use.  Practicing safe sex.  Taking low-dose aspirin every day.  Taking vitamin and mineral supplements as recommended by your health care provider. What happens during an annual well check? The services and screenings done by your health care provider during your annual well check will depend on your age, overall health, lifestyle risk factors, and family history of disease. Counseling  Your health care provider may ask you questions about your:  Alcohol use.  Tobacco use.  Drug use.  Emotional  well-being.  Home and relationship well-being.  Sexual activity.  Eating habits.  History of falls.  Memory and ability to understand (cognition).  Work and work Statistician.  Reproductive health. Screening  You may have the following tests or measurements:  Height, weight, and BMI.  Blood pressure.  Lipid and cholesterol levels. These may be checked every 5 years, or more frequently if you are over 74 years old.  Skin check.  Lung cancer screening. You may have this screening every year starting at age 35 if you have a 30-pack-year history of smoking and currently smoke or have quit within the past 15 years.  Fecal occult blood test (FOBT) of the stool. You may have this test every year starting at age 72.  Flexible sigmoidoscopy or colonoscopy. You may have a sigmoidoscopy every 5 years or a colonoscopy every 10 years starting at age 37.  Hepatitis C blood test.  Hepatitis B blood test.  Sexually transmitted disease (STD) testing.  Diabetes screening. This is done by checking your blood sugar (glucose) after you have not eaten for a while (fasting). You may have this done every 1-3 years.  Bone density scan. This is done to screen for osteoporosis. You may have this done starting at age 30.  Mammogram. This may be done every 1-2 years. Talk to your health care provider about how often you should have regular mammograms. Talk with your health care provider about your test results, treatment options, and if necessary, the need for more tests. Vaccines  Your health care provider may recommend certain vaccines, such as:  Influenza vaccine. This is recommended every year.  Tetanus, diphtheria, and acellular pertussis (  Tdap, Td) vaccine. You may need a Td booster every 10 years.  Zoster vaccine. You may need this after age 63.  Pneumococcal 13-valent conjugate (PCV13) vaccine. One dose is recommended after age 28.  Pneumococcal polysaccharide (PPSV23) vaccine. One  dose is recommended after age 1. Talk to your health care provider about which screenings and vaccines you need and how often you need them. This information is not intended to replace advice given to you by your health care provider. Make sure you discuss any questions you have with your health care provider. Document Released: 09/25/2015 Document Revised: 05/18/2016 Document Reviewed: 06/30/2015 Elsevier Interactive Patient Education  2017 [employer] Prevention in the Home Falls can cause injuries. They can happen to people of all ages. There are many things you can do to make your home safe and to help prevent falls. What can I do on the outside of my home?  Regularly fix the edges of walkways and driveways and fix any cracks.  Remove anything that might make you trip as you walk through a door, such as a raised step or threshold.  Trim any bushes or trees on the path to your home.  Use bright outdoor lighting.  Clear any walking paths of anything that might make someone trip, such as rocks or tools.  Regularly check to see if handrails are loose or broken. Make sure that both sides of any steps have handrails.  Any raised decks and porches should have guardrails on the edges.  Have any leaves, snow, or ice cleared regularly.  Use sand or salt on walking paths during winter.  Clean up any spills in your garage right away. This includes oil or grease spills. What can I do in the bathroom?  Use night lights.  Install grab bars by the toilet and in the tub and shower. Do not use towel bars as grab bars.  Use non-skid mats or decals in the tub or shower.  If you need to sit down in the shower, use a plastic, non-slip stool.  Keep the floor dry. Clean up any water that spills on the floor as soon as it happens.  Remove soap buildup in the tub or shower regularly.  Attach bath mats securely with double-sided non-slip rug tape.  Do not have throw rugs and other  things on the floor that can make you trip. What can I do in the bedroom?  Use night lights.  Make sure that you have a light by your bed that is easy to reach.  Do not use any sheets or blankets that are too big for your bed. They should not hang down onto the floor.  Have a firm chair that has side arms. You can use this for support while you get dressed.  Do not have throw rugs and other things on the floor that can make you trip. What can I do in the kitchen?  Clean up any spills right away.  Avoid walking on wet floors.  Keep items that you use a lot in easy-to-reach places.  If you need to reach something above you, use a strong step stool that has a grab bar.  Keep electrical cords out of the way.  Do not use floor polish or wax that makes floors slippery. If you must use wax, use non-skid floor wax.  Do not have throw rugs and other things on the floor that can make you trip. What can I do with my stairs?  Do  not leave any items on the stairs.  Make sure that there are handrails on both sides of the stairs and use them. Fix handrails that are broken or loose. Make sure that handrails are as long as the stairways.  Check any carpeting to make sure that it is firmly attached to the stairs. Fix any carpet that is loose or worn.  Avoid having throw rugs at the top or bottom of the stairs. If you do have throw rugs, attach them to the floor with carpet tape.  Make sure that you have a light switch at the top of the stairs and the bottom of the stairs. If you do not have them, ask someone to add them for you. What else can I do to help prevent falls?  Wear shoes that:  Do not have high heels.  Have rubber bottoms.  Are comfortable and fit you well.  Are closed at the toe. Do not wear sandals.  If you use a stepladder:  Make sure that it is fully opened. Do not climb a closed stepladder.  Make sure that both sides of the stepladder are locked into place.  Ask  someone to hold it for you, if possible.  Clearly mark and make sure that you can see:  Any grab bars or handrails.  First and last steps.  Where the edge of each step is.  Use tools that help you move around (mobility aids) if they are needed. These include:  Canes.  Walkers.  Scooters.  Crutches.  Turn on the lights when you go into a dark area. Replace any light bulbs as soon as they burn out.  Set up your furniture so you have a clear path. Avoid moving your furniture around.  If any of your floors are uneven, fix them.  If there are any pets around you, be aware of where they are.  Review your medicines with your doctor. Some medicines can make you feel dizzy. This can increase your chance of falling. Ask your doctor what other things that you can do to help prevent falls. This information is not intended to replace advice given to you by your health care provider. Make sure you discuss any questions you have with your health care provider. Document Released: 06/25/2009 Document Revised: 02/04/2016 Document Reviewed: 10/03/2014 Elsevier Interactive Patient Education  2017 [employer].

## 2019-08-27 ENCOUNTER — Other Ambulatory Visit: Payer: Medicare Other

## 2019-08-27 ENCOUNTER — Other Ambulatory Visit: Payer: Self-pay

## 2019-08-27 DIAGNOSIS — I119 Hypertensive heart disease without heart failure: Secondary | ICD-10-CM

## 2019-08-27 DIAGNOSIS — E785 Hyperlipidemia, unspecified: Secondary | ICD-10-CM

## 2019-08-28 LAB — COMPLETE METABOLIC PANEL WITH GFR
AG Ratio: 1.6 (calc) (ref 1.0–2.5)
ALT: 10 U/L (ref 6–29)
AST: 15 U/L (ref 10–35)
Albumin: 4.1 g/dL (ref 3.6–5.1)
Alkaline phosphatase (APISO): 67 U/L (ref 37–153)
BUN: 19 mg/dL (ref 7–25)
CO2: 29 mmol/L (ref 20–32)
Calcium: 10.6 mg/dL — ABNORMAL HIGH (ref 8.6–10.4)
Chloride: 105 mmol/L (ref 98–110)
Creat: 0.75 mg/dL (ref 0.60–0.88)
GFR, Est African American: 87 mL/min/{1.73_m2} (ref >=60)
GFR, Est Non African American: 75 mL/min/{1.73_m2} (ref >=60)
Globulin: 2.6 g/dL (calc) (ref 1.9–3.7)
Glucose, Bld: 84 mg/dL (ref 65–99)
Potassium: 4.6 mmol/L (ref 3.5–5.3)
Sodium: 141 mmol/L (ref 135–146)
Total Bilirubin: 0.4 mg/dL (ref 0.2–1.2)
Total Protein: 6.7 g/dL (ref 6.1–8.1)

## 2019-08-28 LAB — CBC WITH DIFFERENTIAL/PLATELET
Absolute Monocytes: 392 cells/uL (ref 200–950)
Basophils Absolute: 32 cells/uL (ref 0–200)
Basophils Relative: 0.6 %
Eosinophils Absolute: 69 cells/uL (ref 15–500)
Eosinophils Relative: 1.3 %
HCT: 34.6 % — ABNORMAL LOW (ref 35.0–45.0)
Hemoglobin: 12.6 g/dL (ref 11.7–15.5)
Lymphs Abs: 1458 cells/uL (ref 850–3900)
MCH: 34 pg — ABNORMAL HIGH (ref 27.0–33.0)
MCHC: 36.4 g/dL — ABNORMAL HIGH (ref 32.0–36.0)
MCV: 93.3 fL (ref 80.0–100.0)
MPV: 9.6 fL (ref 7.5–12.5)
Monocytes Relative: 7.4 %
Neutro Abs: 3350 cells/uL (ref 1500–7800)
Neutrophils Relative %: 63.2 %
Platelets: 295 10*3/uL (ref 140–400)
RBC: 3.71 10*6/uL — ABNORMAL LOW (ref 3.80–5.10)
RDW: 12.3 % (ref 11.0–15.0)
Total Lymphocyte: 27.5 %
WBC: 5.3 10*3/uL (ref 3.8–10.8)

## 2019-08-28 LAB — LIPID PANEL
Cholesterol: 198 mg/dL (ref <200)
HDL: 63 mg/dL (ref >=50)
LDL Cholesterol (Calc): 113 mg/dL (calc) — ABNORMAL HIGH
Non-HDL Cholesterol (Calc): 135 mg/dL (calc) — ABNORMAL HIGH (ref <130)
Total CHOL/HDL Ratio: 3.1 (calc) (ref <5.0)
Triglycerides: 110 mg/dL (ref <150)

## 2019-10-02 ENCOUNTER — Ambulatory Visit: Payer: Medicare Other | Attending: Internal Medicine

## 2019-10-02 DIAGNOSIS — Z23 Encounter for immunization: Secondary | ICD-10-CM

## 2019-10-02 NOTE — Progress Notes (Signed)
   Covid-19 Vaccination Clinic  Name:  Brooke Cox    MRN: [id number] DOB: 11/25/1940  10/02/2019  Brooke Cox was observed post Covid-19 immunization for 15 minutes without incidence. She was provided with Vaccine Information Sheet and instruction to access the V-Safe system.   Brooke Cox was instructed to call 911 with any severe reactions post vaccine: Marland Kitchen Difficulty breathing  . Swelling of your face and throat  . A fast heartbeat  . A bad rash all over your body  . Dizziness and weakness    Immunizations Administered    Name Date Dose VIS Date Route   Pfizer COVID-19 Vaccine 10/02/2019  9:29 AM 0.3 mL 08/23/2019 Intramuscular   Manufacturer: [place]   Lot: [id number]   [place]: [id number]

## 2019-10-20 ENCOUNTER — Ambulatory Visit: Payer: Medicare Other | Attending: Internal Medicine

## 2019-10-20 DIAGNOSIS — Z23 Encounter for immunization: Secondary | ICD-10-CM

## 2019-10-20 NOTE — Progress Notes (Signed)
   Covid-19 Vaccination Clinic  Name:  Mylasia Vorhees    MRN: [id number] DOB: 1941/01/16  10/20/2019  Ms. Inocencio was observed post Covid-19 immunization for 15 minutes without incidence. She was provided with Vaccine Information Sheet and instruction to access the V-Safe system.   Ms. Lapier was instructed to call 911 with any severe reactions post vaccine: Marland Kitchen Difficulty breathing  . Swelling of your face and throat  . A fast heartbeat  . A bad rash all over your body  . Dizziness and weakness    Immunizations Administered    Name Date Dose VIS Date Route   Pfizer COVID-19 Vaccine 10/20/2019  8:07 AM 0.3 mL 08/23/2019 Intramuscular   Manufacturer: [place]   Lot: [id number]   [place]: [id number]

## 2019-11-27 ENCOUNTER — Telehealth: Payer: Self-pay

## 2019-11-27 NOTE — Telephone Encounter (Signed)
Called patient per Brooke Chandler, NP request and scheduled an Advance Care Planning appointment to discuss a MOST form.   Appointment scheduled for tomorrow. Patient confirmed that she has a smartphone (iphone) [phone].  I offered to schedule patients husband whom is also a patient at [facility] the same appointment type and he refused at this time. I will also make a notation in his chart.

## 2019-11-28 ENCOUNTER — Other Ambulatory Visit: Payer: Self-pay

## 2019-11-28 ENCOUNTER — Encounter: Payer: Medicare Other | Admitting: Nurse Practitioner

## 2019-11-28 ENCOUNTER — Encounter: Payer: Self-pay | Admitting: Nurse Practitioner

## 2019-11-28 NOTE — Progress Notes (Signed)
err

## 2019-11-28 NOTE — Progress Notes (Deleted)
This service is provided via telemedicine  No vital signs collected/recorded due to the encounter was a telemedicine visit.   Location of patient (ex: home, work):  Home  Patient consents to a telephone visit:  Yes  Location of the provider (ex: office, home):  [place].  Name of any referring provider:  N/A  Names of all persons participating in the telemedicine service and their role in the encounter:  Patient, Heriberto Antigua, RMA, Sherrie Mustache, NP.    Time spent on call:  8 minutes on the phone with Medical Assistant.       Careteam: Patient Care Team: [name], NP as PCP - General (Geriatric Medicine) Renard Hamper, Marguerita Merles (Physician Assistant) Clent Jacks, MD as Consulting Physician (Ophthalmology) Rozetta Nunnery, MD as Consulting Physician (Otolaryngology)  PLACE OF SERVICE:  [place]  Advanced Directive information    Allergies  Allergen Reactions  . Shellfish Allergy Nausea And Vomiting    Chief Complaint  Patient presents with  . Acute Visit    Advance Care Planning     HPI: Patient is a 79 y.o. female ***  Review of Systems:  ROS***  Past Medical History:  Diagnosis Date  . Disorder of bone and cartilage, unspecified   . Disorders of bursae and tendons in shoulder region, unspecified   . Encounter for long-term (current) use of other medications   . Esophageal reflux   . Hyperosmolality and/or hypernatremia   . Osteoporosis, unspecified   . Other and unspecified hyperlipidemia   . Pain in joint, shoulder region   . Pain in limb   . Palpitations   . Pure hypercholesterolemia   . Reflux esophagitis   . Tachycardia, unspecified   . Unspecified essential hypertension   . Unspecified urinary incontinence    Past Surgical History:  Procedure Laterality Date  . APPENDECTOMY  1955  . Bone Density  08/14/2012  . CATARACT EXTRACTION Bilateral 2010  . CHOLECYSTECTOMY  2004  . COLONOSCOPY  03/31/2009   Diverticulosis, Dr.John Amedeo Plenty   . DIAGNOSTIC MAMMOGRAM    . LAPAROSCOPY N/A 02/15/2018   Procedure: LAPAROSCOPY DIAGNOSTIC, LYSIS OF ADHESION;  Surgeon: Ralene Ok, MD;  Location: [facility];  Service: General;  Laterality: N/A;   Social History:   reports that she has never smoked. She has never used smokeless tobacco. She reports that she does not drink alcohol or use drugs.  Family History  Problem Relation Age of Onset  . Hypertension Mother   . Diabetes Brother   . Crohn's disease Neg Hx   . Inflammatory bowel disease Neg Hx     Medications: Patient's Medications  New Prescriptions   No medications on file  Previous Medications   ACETAMINOPHEN (TYLENOL) 325 MG TABLET    Take 325 mg by mouth every 6 (six) hours as needed for moderate pain.   ASPIRIN EC 81 MG TABLET    Take 81 mg by mouth daily.   ATENOLOL (TENORMIN) 25 MG TABLET    TAKE 1 TABLET BY MOUTH EVERYDAY AT BEDTIME   BIOTIN 5000 MCG CAPS    Take by mouth daily.   CALCIUM CARBONATE (CALCIUM 600) 600 MG TABS TABLET    Take 600 mg by mouth 2 (two) times daily with a meal.    CHOLECALCIFEROL (VITAMIN D) 2000 UNITS TABLET    Take 2,000 Units by mouth daily.    OMEPRAZOLE (PRILOSEC) 20 MG CAPSULE    Take 1 capsule (20 mg total) by mouth daily.  SIMVASTATIN (ZOCOR) 40 MG TABLET    TAKE 1 TABLET BY MOUTH DAILY AT 6:00PM  Modified Medications   No medications on file  Discontinued Medications   No medications on file    Physical Exam:  There were no vitals filed for this visit. There is no height or weight on file to calculate BMI. Wt Readings from Last 3 Encounters:  08/20/18 150 lb 12.8 oz (68.4 kg)  08/13/18 150 lb 12.8 oz (68.4 kg)  04/16/18 143 lb (64.9 kg)    Physical Exam***  Labs reviewed: Basic Metabolic Panel: Recent Labs    02/05/19 0802 08/27/19 0818  NA 141 141  K 4.4 4.6  CL 106 105  CO2 31 29  GLUCOSE 85 84  BUN 19 19  CREATININE 0.68 0.75  CALCIUM 10.4 10.6*   Liver Function Tests:  Recent Labs    02/05/19 0802 08/27/19 0818  AST 14 15  ALT 9 10  BILITOT 0.5 0.4  PROT 6.7 6.7   No results for input(s): LIPASE, AMYLASE in the last 8760 hours. No results for input(s): AMMONIA in the last 8760 hours. CBC: Recent Labs    08/27/19 0818  WBC 5.3  NEUTROABS 3,350  HGB 12.6  HCT 34.6*  MCV 93.3  PLT 295   Lipid Panel: Recent Labs    02/05/19 0802 08/27/19 0818  CHOL 191 198  HDL 62 63  LDLCALC 108* 113*  TRIG 118 110  CHOLHDL 3.1 3.1   TSH: No results for input(s): TSH in the last 8760 hours. A1C: No results found for: HGBA1C   Assessment/Plan There are no diagnoses linked to this encounter.  Next appt: *** Roderica Cathell K. [place], [facility] [phone]

## 2019-12-04 ENCOUNTER — Encounter: Payer: Self-pay | Admitting: Nurse Practitioner

## 2019-12-04 ENCOUNTER — Other Ambulatory Visit: Payer: Self-pay

## 2019-12-04 ENCOUNTER — Ambulatory Visit (INDEPENDENT_AMBULATORY_CARE_PROVIDER_SITE_OTHER): Payer: Medicare Other | Admitting: Nurse Practitioner

## 2019-12-04 VITALS — BP 126/78 | HR 74 | Temp 97.3°F | Wt 157.4 lb

## 2019-12-04 DIAGNOSIS — Z7189 Other specified counseling: Secondary | ICD-10-CM | POA: Diagnosis not present

## 2019-12-04 NOTE — Progress Notes (Signed)
Careteam: Patient Care Team: [name], NP as PCP - General (Geriatric Medicine) Renard Hamper, Marguerita Merles (Physician Assistant) Clent Jacks, MD as Consulting Physician (Ophthalmology) Rozetta Nunnery, MD as Consulting Physician (Otolaryngology)  PLACE OF SERVICE:  [place]  Advanced Directive information    Allergies  Allergen Reactions  . Shellfish Allergy Nausea And Vomiting    No chief complaint on file.    HPI: Patient is a 79 y.o. Cox for MOST from completion.  Pt with hx of CHF, GERD, Osteopenia, hyperlipidemia, HTN.  She is here today to complete MOST form and to answer questions.   Ready for the gym to open, has gotten both of her COVID vaccines.   Review of Systems:  Review of Systems  Constitutional: Negative for chills, fever and malaise/fatigue.  All other systems reviewed and are negative.   Past Medical History:  Diagnosis Date  . Disorder of bone and cartilage, unspecified   . Disorders of bursae and tendons in shoulder region, unspecified   . Encounter for long-term (current) use of other medications   . Esophageal reflux   . Hyperosmolality and/or hypernatremia   . Osteoporosis, unspecified   . Other and unspecified hyperlipidemia   . Pain in joint, shoulder region   . Pain in limb   . Palpitations   . Pure hypercholesterolemia   . Reflux esophagitis   . Tachycardia, unspecified   . Unspecified essential hypertension   . Unspecified urinary incontinence    Past Surgical History:  Procedure Laterality Date  . APPENDECTOMY  1955  . Bone Density  08/14/2012  . CATARACT EXTRACTION Bilateral 2010  . CHOLECYSTECTOMY  2004  . COLONOSCOPY  03/31/2009   Diverticulosis, Dr.John Amedeo Plenty   . DIAGNOSTIC MAMMOGRAM    . LAPAROSCOPY N/A 02/15/2018   Procedure: LAPAROSCOPY DIAGNOSTIC, LYSIS OF ADHESION;  Surgeon: Ralene Ok, MD;  Location: [facility];  Service: General;  Laterality: N/A;   Social History:   reports that she has  never smoked. She has never used smokeless tobacco. She reports that she does not drink alcohol or use drugs.  Family History  Problem Relation Age of Onset  . Hypertension Mother   . Diabetes Brother   . Crohn's disease Neg Hx   . Inflammatory bowel disease Neg Hx     Medications: Patient's Medications  New Prescriptions   No medications on file  Previous Medications   ACETAMINOPHEN (TYLENOL) 325 MG TABLET    Take 325 mg by mouth every 6 (six) hours as needed for moderate pain.   ASPIRIN EC 81 MG TABLET    Take 81 mg by mouth daily.   ATENOLOL (TENORMIN) 25 MG TABLET    TAKE 1 TABLET BY MOUTH EVERYDAY AT BEDTIME   BIOTIN 5000 MCG CAPS    Take by mouth daily.   CALCIUM CARBONATE (CALCIUM 600) 600 MG TABS TABLET    Take 600 mg by mouth 2 (two) times daily with a meal.    CHOLECALCIFEROL (VITAMIN D) 2000 UNITS TABLET    Take 2,000 Units by mouth daily.    OMEPRAZOLE (PRILOSEC) 20 MG CAPSULE    Take 1 capsule (20 mg total) by mouth daily.   SIMVASTATIN (ZOCOR) 40 MG TABLET    TAKE 1 TABLET BY MOUTH DAILY AT 6:00PM  Modified Medications   No medications on file  Discontinued Medications   No medications on file    Physical Exam:  There were no vitals filed for this visit. There is no  height or weight on file to calculate BMI. Wt Readings from Last 3 Encounters:  08/20/18 150 lb 12.8 oz (68.4 kg)  08/13/18 150 lb 12.8 oz (68.4 kg)  04/16/18 143 lb (64.9 kg)    Physical Exam Constitutional:      Appearance: Normal appearance.  Skin:    General: Skin is warm and dry.  Neurological:     Mental Status: She is alert and oriented to person, place, and time.  Psychiatric:        Mood and Affect: Mood normal.        Behavior: Behavior normal.    Labs reviewed: Basic Metabolic Panel: Recent Labs    02/05/19 0802 08/27/19 0818  NA 141 141  K 4.4 4.6  CL 106 105  CO2 31 29  GLUCOSE 85 84  BUN 19 19  CREATININE 0.68 0.75  CALCIUM 10.4 10.6*   Liver Function  Tests: Recent Labs    02/05/19 0802 08/27/19 0818  AST 14 15  ALT 9 10  BILITOT 0.5 0.4  PROT 6.7 6.7   No results for input(s): LIPASE, AMYLASE in the last 8760 hours. No results for input(s): AMMONIA in the last 8760 hours. CBC: Recent Labs    08/27/19 0818  WBC 5.3  NEUTROABS 3,350  HGB 12.6  HCT 34.6*  MCV 93.3  PLT 295   Lipid Panel: Recent Labs    02/05/19 0802 08/27/19 0818  CHOL 191 198  HDL 62 63  LDLCALC 108* 113*  TRIG 118 110  CHOLHDL 3.1 3.1   TSH: No results for input(s): TSH in the last 8760 hours. A1C: No results found for: HGBA1C   Assessment/Plan 1. Advance care planning MOST from completed and all questions answered -already has DNR on file.   Next appt: 02/21/2020 as scheduled  Shaelee Forni K. [place], [facility] [phone]

## 2020-02-21 ENCOUNTER — Other Ambulatory Visit: Payer: Self-pay

## 2020-02-21 ENCOUNTER — Encounter: Payer: Self-pay | Admitting: Nurse Practitioner

## 2020-02-21 ENCOUNTER — Ambulatory Visit (INDEPENDENT_AMBULATORY_CARE_PROVIDER_SITE_OTHER): Payer: Medicare Other | Admitting: Nurse Practitioner

## 2020-02-21 VITALS — BP 122/78 | HR 59 | Temp 96.6°F | Ht <= 58 in | Wt 157.0 lb

## 2020-02-21 DIAGNOSIS — E785 Hyperlipidemia, unspecified: Secondary | ICD-10-CM

## 2020-02-21 DIAGNOSIS — I119 Hypertensive heart disease without heart failure: Secondary | ICD-10-CM

## 2020-02-21 DIAGNOSIS — M8580 Other specified disorders of bone density and structure, unspecified site: Secondary | ICD-10-CM | POA: Diagnosis not present

## 2020-02-21 DIAGNOSIS — K219 Gastro-esophageal reflux disease without esophagitis: Secondary | ICD-10-CM

## 2020-02-21 DIAGNOSIS — M5441 Lumbago with sciatica, right side: Secondary | ICD-10-CM

## 2020-02-21 MED ORDER — PANTOPRAZOLE SODIUM 40 MG PO TBEC: 40.0000 mg | DELAYED_RELEASE_TABLET | Freq: Every day | ORAL | 3 refills | Status: DC

## 2020-02-21 NOTE — Progress Notes (Signed)
Careteam: Patient Care Team: Brooke Chandler, NP as PCP - General (Geriatric Medicine) Brooke Cox (Physician Assistant) Clent Jacks, MD as Consulting Physician (Ophthalmology) Rozetta Nunnery, MD as Consulting Physician (Otolaryngology)  PLACE OF SERVICE:  [place] Directive information Does Patient Have a Medical Advance Directive?: Yes, Type of Advance Directive: Out of facility DNR (pink MOST or yellow form), Pre-existing out of facility DNR order (yellow form or pink MOST form): Yellow form placed in chart (order not valid for inpatient use);Pink MOST form placed in chart (order not valid for inpatient use), Does patient want to make changes to medical advance directive?: No - Patient declined  Allergies  Allergen Reactions  . Shellfish Allergy Nausea And Vomiting    Chief Complaint  Patient presents with  . Medical Management of Chronic Issues    6 month follow-up  . Hip Problem    Right hip/leg pain off/on      HPI: Patient is a 79 y.o. female for routine follow up  Calcium level was elevated at last office visit-stopped calcium supplement.   Gerd-ongoing. Taking tums which helps, also on prilosec. GERD mostly at night, prior to going to bed.    Hyperlipidemia- on zocor, LDL mildly elevated on last labs, diet is hard.   Hypertension/tachycardia- stable on atenolol 25 mg daily at bedtime, occasional palpitations, not regular maybe once monthly. Will last a second and then resolve.   Right low back pain that goes down to her leg- not all the time. Does not have it when she is walking. "not really bad" 5/10. Just started in the last month. Will happen about 3 times a week- notices for a few minutes and takes tylenol which resolves pain.    Review of Systems:  Review of Systems  Constitutional: Negative for chills, fever and weight loss.  HENT: Negative for tinnitus.   Respiratory: Negative for cough, sputum production and  shortness of breath.   Cardiovascular: Negative for chest pain, palpitations and leg swelling.  Gastrointestinal: Positive for heartburn. Negative for abdominal pain, constipation, diarrhea, nausea and vomiting.  Genitourinary: Negative for dysuria, frequency and urgency.  Musculoskeletal: Positive for back pain and myalgias. Negative for falls and joint pain.  Skin: Negative.   Neurological: Negative for dizziness and headaches.  Psychiatric/Behavioral: Negative for depression and memory loss. The patient does not have insomnia.     Past Medical History:  Diagnosis Date  . Disorder of bone and cartilage, unspecified   . Disorders of bursae and tendons in shoulder region, unspecified   . Encounter for long-term (current) use of other medications   . Esophageal reflux   . Hyperosmolality and/or hypernatremia   . Osteoporosis, unspecified   . Other and unspecified hyperlipidemia   . Pain in joint, shoulder region   . Pain in limb   . Palpitations   . Pure hypercholesterolemia   . Reflux esophagitis   . Tachycardia, unspecified   . Unspecified essential hypertension   . Unspecified urinary incontinence    Past Surgical History:  Procedure Laterality Date  . APPENDECTOMY  1955  . Bone Density  08/14/2012  . CATARACT EXTRACTION Bilateral 2010  . CHOLECYSTECTOMY  2004  . COLONOSCOPY  03/31/2009   Diverticulosis, Dr.John Amedeo Plenty   . DIAGNOSTIC MAMMOGRAM    . LAPAROSCOPY N/A 02/15/2018   Procedure: LAPAROSCOPY DIAGNOSTIC, LYSIS OF ADHESION;  Surgeon: Ralene Ok, MD;  Location: [facility];  Service: General;  Laterality: N/A;   Social History:  reports that she has never smoked. She has never used smokeless tobacco. She reports that she does not drink alcohol and does not use drugs.  Family History  Problem Relation Age of Onset  . Hypertension Mother   . Diabetes Brother   . Crohn's disease Neg Hx   . Inflammatory bowel disease Neg Hx     Medications: Patient's Medications    New Prescriptions   No medications on file  Previous Medications   ACETAMINOPHEN (TYLENOL) 650 MG CR TABLET    Take 650 mg by mouth daily.   ASPIRIN EC 81 MG TABLET    Take 81 mg by mouth daily.   ATENOLOL (TENORMIN) 25 MG TABLET    TAKE 1 TABLET BY MOUTH EVERYDAY AT BEDTIME   BIOTIN 5000 MCG CAPS    Take by mouth daily.   CALCIUM CARBONATE (CALCIUM 600) 600 MG TABS TABLET    Take 600 mg by mouth 2 (two) times daily with a meal.    CHOLECALCIFEROL (VITAMIN D) 2000 UNITS TABLET    Take 2,000 Units by mouth daily.    OMEPRAZOLE (PRILOSEC) 20 MG CAPSULE    Take 1 capsule (20 mg total) by mouth daily.   SIMVASTATIN (ZOCOR) 40 MG TABLET    TAKE 1 TABLET BY MOUTH DAILY AT 6:00PM  Modified Medications   No medications on file  Discontinued Medications   ACETAMINOPHEN (TYLENOL) 325 MG TABLET    Take 325 mg by mouth every 6 (six) hours as needed for moderate pain.    Physical Exam:  Vitals:   02/21/20 0922  BP: 122/78  Pulse: (!) 59  Temp: (!) 96.6 F (35.9 C)  TempSrc: Temporal  SpO2: 99%  Weight: 157 lb (71.2 kg)  Height: 4' 10"  (1.473 m)   Body mass index is 32.81 kg/m. Wt Readings from Last 3 Encounters:  02/21/20 157 lb (71.2 kg)  12/04/19 157 lb 6.4 oz (71.4 kg)  08/20/18 150 lb 12.8 oz (68.4 kg)    Physical Exam Constitutional:      General: She is not in acute distress.    Appearance: She is well-developed. She is not diaphoretic.  HENT:     Head: Normocephalic and atraumatic.     Mouth/Throat:     Pharynx: No oropharyngeal exudate.  Eyes:     Conjunctiva/sclera: Conjunctivae normal.     Pupils: Pupils are equal, round, and reactive to light.  Cardiovascular:     Rate and Rhythm: Normal rate and regular rhythm.     Heart sounds: Normal heart sounds.  Pulmonary:     Effort: Pulmonary effort is normal.     Breath sounds: Normal breath sounds.  Abdominal:     General: Bowel sounds are normal.     Palpations: Abdomen is soft.  Musculoskeletal:        General:  No tenderness.     Cervical back: Normal range of motion and neck supple.     Lumbar back: No swelling or tenderness. Negative right straight leg raise test and negative left straight leg raise test.     Right lower leg: No edema.     Left lower leg: No edema.  Skin:    General: Skin is warm and dry.  Neurological:     Mental Status: She is alert and oriented to person, place, and time.  Psychiatric:        Mood and Affect: Mood normal.        Behavior: Behavior normal.     Labs  reviewed: Basic Metabolic Panel: Recent Labs    08/27/19 0818  NA 141  K 4.6  CL 105  CO2 29  GLUCOSE 84  BUN 19  CREATININE 0.75  CALCIUM 10.6*   Liver Function Tests: Recent Labs    08/27/19 0818  AST 15  ALT 10  BILITOT 0.4  PROT 6.7   No results for input(s): LIPASE, AMYLASE in the last 8760 hours. No results for input(s): AMMONIA in the last 8760 hours. CBC: Recent Labs    08/27/19 0818  WBC 5.3  NEUTROABS 3,350  HGB 12.6  HCT 34.6*  MCV 93.3  PLT 295   Lipid Panel: Recent Labs    08/27/19 0818  CHOL 198  HDL 63  LDLCALC 113*  TRIG 110  CHOLHDL 3.1   TSH: No results for input(s): TSH in the last 8760 hours. A1C: No results found for: HGBA1C   Assessment/Plan 1. Osteopenia, unspecified location -continues on vit d, stopped calcium due to elevated levels. Encouraged weight bearing activity   2. Gastroesophageal reflux disease without esophagitis -uncontrolled on omeprazole and reports she is taking a lot of tums (educated her that this was calcium). Will stop omeprazole and start protonix, to take 30 mins prior to supper time. infomaton provided on lifestyle modifications  - pantoprazole (PROTONIX) 40 MG tablet; Take 1 tablet (40 mg total) by mouth daily.  Dispense: 30 tablet; Refill: 3  3. Benign hypertensive heart disease without heart failure Stable,  Continues on atenolol  - CMP with eGFR([facility]) - CBC with Differential/Platelet  4. Hyperlipidemia with  target LDL less than 130 -LDL stable, continues on zocor 40 mg daily - CMP with eGFR([facility]) - Lipid Panel  5. Acute right-sided low back pain with right-sided sciatica -pain off and on, encouraged good body mechanics, no heavy lifting, can continue to use tylenol if needed which has been effective, heat PRN   Next appt: 6 months, sooner if needed  Brooke Cox K. [place], [facility] [phone]

## 2020-02-21 NOTE — Patient Instructions (Addendum)
To stop prilosec  To start protonix 30 minutes before evening meal  For back pain Tylenol as needed  To use heating pad as needed- can use muscle rub after heat Core strengthening exercises    Gastroesophageal Reflux Disease, Adult Gastroesophageal reflux (GER) happens when acid from the stomach flows up into the tube that connects the mouth and the stomach (esophagus). Normally, food travels down the esophagus and stays in the stomach to be digested. With GER, food and stomach acid sometimes move back up into the esophagus. You may have a disease called gastroesophageal reflux disease (GERD) if the reflux:  Happens often.  Causes frequent or very bad symptoms.  Causes problems such as damage to the esophagus. When this happens, the esophagus becomes sore and swollen (inflamed). Over time, GERD can make small holes (ulcers) in the lining of the esophagus. What are the causes? This condition is caused by a problem with the muscle between the esophagus and the stomach. When this muscle is weak or not normal, it does not close properly to keep food and acid from coming back up from the stomach. The muscle can be weak because of:  Tobacco use.  Pregnancy.  Having a certain type of hernia (hiatal hernia).  Alcohol use.  Certain foods and drinks, such as coffee, chocolate, onions, and peppermint. What increases the risk? You are more likely to develop this condition if you:  Are overweight.  Have a disease that affects your connective tissue.  Use NSAID medicines. What are the signs or symptoms? Symptoms of this condition include:  Heartburn.  Difficult or painful swallowing.  The feeling of having a lump in the throat.  A bitter taste in the mouth.  Bad breath.  Having a lot of saliva.  Having an upset or bloated stomach.  Belching.  Chest pain. Different conditions can cause chest pain. Make sure you see your doctor if you have chest pain.  Shortness of breath  or noisy breathing (wheezing).  Ongoing (chronic) cough or a cough at night.  Wearing away of the surface of teeth (tooth enamel).  Weight loss. How is this treated? Treatment will depend on how bad your symptoms are. Your doctor may suggest:  Changes to your diet.  Medicine.  Surgery. Follow these instructions at home: Eating and drinking   Follow a diet as told by your doctor. You may need to avoid foods and drinks such as: ? Coffee and tea (with or without caffeine). ? Drinks that contain alcohol. ? Energy drinks and sports drinks. ? Bubbly (carbonated) drinks or sodas. ? Chocolate and cocoa. ? Peppermint and mint flavorings. ? Garlic and onions. ? Horseradish. ? Spicy and acidic foods. These include peppers, chili powder, curry powder, vinegar, hot sauces, and BBQ sauce. ? Citrus fruit juices and citrus fruits, such as oranges, lemons, and limes. ? Tomato-based foods. These include red sauce, chili, salsa, and pizza with red sauce. ? Fried and fatty foods. These include donuts, french fries, potato chips, and high-fat dressings. ? High-fat meats. These include hot dogs, rib eye steak, sausage, ham, and bacon. ? High-fat dairy items, such as whole milk, butter, and cream cheese.  Eat small meals often. Avoid eating large meals.  Avoid drinking large amounts of liquid with your meals.  Avoid eating meals during the 2-3 hours before bedtime.  Avoid lying down right after you eat.  Do not exercise right after you eat. Lifestyle   Do not use any products that contain nicotine or tobacco.  These include cigarettes, e-cigarettes, and chewing tobacco. If you need help quitting, ask your doctor.  Try to lower your stress. If you need help doing this, ask your doctor.  If you are overweight, lose an amount of weight that is healthy for you. Ask your doctor about a safe weight loss goal. General instructions  Pay attention to any changes in your symptoms.  Take  over-the-counter and prescription medicines only as told by your doctor. Do not take aspirin, ibuprofen, or other NSAIDs unless your doctor says it is okay.  Wear loose clothes. Do not wear anything tight around your waist.  Raise (elevate) the head of your bed about 6 inches (15 cm).  Avoid bending over if this makes your symptoms worse.  Keep all follow-up visits as told by your doctor. This is important. Contact a doctor if:  You have new symptoms.  You lose weight and you do not know why.  You have trouble swallowing or it hurts to swallow.  You have wheezing or a cough that keeps happening.  Your symptoms do not get better with treatment.  You have a hoarse voice. Get help right away if:  You have pain in your arms, neck, jaw, teeth, or back.  You feel sweaty, dizzy, or light-headed.  You have chest pain or shortness of breath.  You throw up (vomit) and your throw-up looks like blood or coffee grounds.  You pass out (faint).  Your poop (stool) is bloody or black.  You cannot swallow, drink, or eat. Summary  If a person has gastroesophageal reflux disease (GERD), food and stomach acid move back up into the esophagus and cause symptoms or problems such as damage to the esophagus.  Treatment will depend on how bad your symptoms are.  Follow a diet as told by your doctor.  Take all medicines only as told by your doctor. This information is not intended to replace advice given to you by your health care provider. Make sure you discuss any questions you have with your health care provider. Document Revised: 03/07/2018 Document Reviewed: 03/07/2018 Elsevier Patient Education  [place].

## 2020-02-22 LAB — COMPLETE METABOLIC PANEL WITH GFR
AG Ratio: 1.7 (calc) (ref 1.0–2.5)
ALT: 9 U/L (ref 6–29)
AST: 15 U/L (ref 10–35)
Albumin: 4 g/dL (ref 3.6–5.1)
Alkaline phosphatase (APISO): 68 U/L (ref 37–153)
BUN: 15 mg/dL (ref 7–25)
CO2: 28 mmol/L (ref 20–32)
Calcium: 10.5 mg/dL — ABNORMAL HIGH (ref 8.6–10.4)
Chloride: 106 mmol/L (ref 98–110)
Creat: 0.71 mg/dL (ref 0.60–0.88)
GFR, Est African American: 93 mL/min/{1.73_m2} (ref >=60)
GFR, Est Non African American: 80 mL/min/{1.73_m2} (ref >=60)
Globulin: 2.4 g/dL (calc) (ref 1.9–3.7)
Glucose, Bld: 83 mg/dL (ref 65–99)
Potassium: 4.4 mmol/L (ref 3.5–5.3)
Sodium: 141 mmol/L (ref 135–146)
Total Bilirubin: 0.5 mg/dL (ref 0.2–1.2)
Total Protein: 6.4 g/dL (ref 6.1–8.1)

## 2020-02-22 LAB — CBC WITH DIFFERENTIAL/PLATELET
Absolute Monocytes: 410 cells/uL (ref 200–950)
Basophils Absolute: 23 cells/uL (ref 0–200)
Basophils Relative: 0.4 %
Eosinophils Absolute: 108 cells/uL (ref 15–500)
Eosinophils Relative: 1.9 %
HCT: 35.4 % (ref 35.0–45.0)
Hemoglobin: 11.8 g/dL (ref 11.7–15.5)
Lymphs Abs: 1568 cells/uL (ref 850–3900)
MCH: 30.6 pg (ref 27.0–33.0)
MCHC: 33.3 g/dL (ref 32.0–36.0)
MCV: 91.7 fL (ref 80.0–100.0)
MPV: 9.4 fL (ref 7.5–12.5)
Monocytes Relative: 7.2 %
Neutro Abs: 3591 cells/uL (ref 1500–7800)
Neutrophils Relative %: 63 %
Platelets: 275 10*3/uL (ref 140–400)
RBC: 3.86 10*6/uL (ref 3.80–5.10)
RDW: 12.9 % (ref 11.0–15.0)
Total Lymphocyte: 27.5 %
WBC: 5.7 10*3/uL (ref 3.8–10.8)

## 2020-02-22 LAB — LIPID PANEL
Cholesterol: 189 mg/dL (ref <200)
HDL: 61 mg/dL (ref >=50)
LDL Cholesterol (Calc): 105 mg/dL (calc) — ABNORMAL HIGH
Non-HDL Cholesterol (Calc): 128 mg/dL (calc) (ref <130)
Total CHOL/HDL Ratio: 3.1 (calc) (ref <5.0)
Triglycerides: 134 mg/dL (ref <150)

## 2020-03-03 ENCOUNTER — Other Ambulatory Visit: Payer: Self-pay | Admitting: Nurse Practitioner

## 2020-03-03 DIAGNOSIS — I119 Hypertensive heart disease without heart failure: Secondary | ICD-10-CM

## 2020-03-25 ENCOUNTER — Other Ambulatory Visit: Payer: Self-pay | Admitting: Nurse Practitioner

## 2020-03-25 DIAGNOSIS — E785 Hyperlipidemia, unspecified: Secondary | ICD-10-CM

## 2020-04-17 ENCOUNTER — Ambulatory Visit (INDEPENDENT_AMBULATORY_CARE_PROVIDER_SITE_OTHER): Payer: Medicare Other | Admitting: Family

## 2020-04-17 ENCOUNTER — Other Ambulatory Visit: Payer: Self-pay

## 2020-04-17 ENCOUNTER — Encounter: Payer: Self-pay | Admitting: Family

## 2020-04-17 VITALS — BP 138/82 | HR 90 | Temp 98.4°F | Resp 16 | Ht <= 58 in | Wt 156.8 lb

## 2020-04-17 DIAGNOSIS — M25551 Pain in right hip: Secondary | ICD-10-CM | POA: Diagnosis not present

## 2020-04-17 DIAGNOSIS — M5441 Lumbago with sciatica, right side: Secondary | ICD-10-CM | POA: Diagnosis not present

## 2020-04-17 MED ORDER — PREDNISONE 10 MG PO TABS: ORAL_TABLET | ORAL | 0 refills | Status: DC

## 2020-04-17 NOTE — Patient Instructions (Signed)
Sciatica  Sciatica is pain, weakness, tingling, or loss of feeling (numbness) along the sciatic nerve. The sciatic nerve starts in the lower back and goes down the back of each leg. Sciatica usually goes away on its own or with treatment. Sometimes, sciatica may come back (recur). What are the causes? This condition happens when the sciatic nerve is pinched or has pressure put on it. This may be the result of:  A disk in between the bones of the spine bulging out too far (herniated disk).  Changes in the spinal disks that occur with aging.  A condition that affects a muscle in the butt.  Extra bone growth near the sciatic nerve.  A break (fracture) of the area between your hip bones (pelvis).  Pregnancy.  Tumor. This is rare. What increases the risk? You are more likely to develop this condition if you:  Play sports that put pressure or stress on the spine.  Have poor strength and ease of movement (flexibility).  Have had a back injury in the past.  Have had back surgery.  Sit for long periods of time.  Do activities that involve bending or lifting over and over again.  Are very overweight (obese). What are the signs or symptoms? Symptoms can vary from mild to very bad. They may include:  Any of these problems in the lower back, leg, hip, or butt: ? Mild tingling, loss of feeling, or dull aches. ? Burning sensations. ? Sharp pains.  Loss of feeling in the back of the calf or the sole of the foot.  Leg weakness.  Very bad back pain that makes it hard to move. These symptoms may get worse when you cough, sneeze, or laugh. They may also get worse when you sit or stand for long periods of time. How is this treated? This condition often gets better without any treatment. However, treatment may include:  Changing or cutting back on physical activity when you have pain.  Doing exercises and stretching.  Putting ice or heat on the affected area.  Medicines that  help: ? To relieve pain and swelling. ? To relax your muscles.  Shots (injections) of medicines that help to relieve pain, irritation, and swelling.  Surgery. Follow these instructions at home: Medicines  Take over-the-counter and prescription medicines only as told by your doctor.  Ask your doctor if the medicine prescribed to you: ? Requires you to avoid driving or using heavy machinery. ? Can cause trouble pooping (constipation). You may need to take these steps to prevent or treat trouble pooping:  Drink enough fluids to keep your pee (urine) pale yellow.  Take over-the-counter or prescription medicines.  Eat foods that are high in fiber. These include beans, whole grains, and fresh fruits and vegetables.  Limit foods that are high in fat and sugar. These include fried or sweet foods. Managing pain      If told, put ice on the affected area. ? Put ice in a plastic bag. ? Place a towel between your skin and the bag. ? Leave the ice on for 20 minutes, 2-3 times a day.  If told, put heat on the affected area. Use the heat source that your doctor tells you to use, such as a moist heat pack or a heating pad. ? Place a towel between your skin and the heat source. ? Leave the heat on for 20-30 minutes. ? Remove the heat if your skin turns bright red. This is very important if you are  unable to feel pain, heat, or cold. You may have a greater risk of getting burned. Activity   Return to your normal activities as told by your doctor. Ask your doctor what activities are safe for you.  Avoid activities that make your symptoms worse.  Take short rests during the day. ? When you rest for a long time, do some physical activity or stretching between periods of rest. ? Avoid sitting for a long time without moving. Get up and move around at least one time each hour.  Exercise and stretch regularly, as told by your doctor.  Do not lift anything that is heavier than 10 lb (4.5 kg)  while you have symptoms of sciatica. ? Avoid lifting heavy things even when you do not have symptoms. ? Avoid lifting heavy things over and over.  When you lift objects, always lift in a way that is safe for your body. To do this, you should: ? Bend your knees. ? Keep the object close to your body. ? Avoid twisting. General instructions  Stay at a healthy weight.  Wear comfortable shoes that support your feet. Avoid wearing high heels.  Avoid sleeping on a mattress that is too soft or too hard. You might have less pain if you sleep on a mattress that is firm enough to support your back.  Keep all follow-up visits as told by your doctor. This is important. Contact a doctor if:  You have pain that: ? Wakes you up when you are sleeping. ? Gets worse when you lie down. ? Is worse than the pain you have had in the past. ? Lasts longer than 4 weeks.  You lose weight without trying. Get help right away if:  You cannot control when you pee (urinate) or poop (have a bowel movement).  You have weakness in any of these areas and it gets worse: ? Lower back. ? The area between your hip bones. ? Butt. ? Legs.  You have redness or swelling of your back.  You have a burning feeling when you pee. Summary  Sciatica is pain, weakness, tingling, or loss of feeling (numbness) along the sciatic nerve.  This condition happens when the sciatic nerve is pinched or has pressure put on it.  Sciatica can cause pain, tingling, or loss of feeling (numbness) in the lower back, legs, hips, and butt.  Treatment often includes rest, exercise, medicines, and putting ice or heat on the affected area. This information is not intended to replace advice given to you by your health care provider. Make sure you discuss any questions you have with your health care provider. Document Revised: 09/17/2018 Document Reviewed: 09/17/2018 Elsevier Patient Education  [place].

## 2020-04-17 NOTE — Progress Notes (Signed)
Provider: Hadasa Gasner FNP-C  [name], NP  Patient Care Team: [name], NP as PCP - General (Geriatric Medicine) Renard Hamper Marguerita Merles (Physician Assistant) Clent Jacks, MD as Consulting Physician (Ophthalmology) Rozetta Nunnery, MD as Consulting Physician (Otolaryngology)  Extended Emergency Contact Information Primary Emergency Contact: Scarboro,Dewey Address: [address]          [place] [address] Johnnette Litter of [place] Phone: [phone] Relation: Spouse  Code Status:  DNR Goals of care: Advanced Directive information Advanced Directives 04/17/2020  Does Patient Have a Medical Advance Directive? Yes  Type of Advance Directive Out of facility DNR (pink MOST or yellow form)  Does patient want to make changes to medical advance directive? No - Patient declined  Copy of [place] in Chart? -  Would patient like information on creating a medical advance directive? -  Pre-existing out of facility DNR order (yellow form or pink MOST form) Pink MOST/Yellow Form most recent copy in chart - Physician notified to receive inpatient order     Chief Complaint  Patient presents with  . Acute Visit    Complains of hip hurting while standing and getting up x 2 months.    HPI:  Pt is a 79 y.o. female seen today for an acute visit for evaluation of hip worst with sitting,driving and stopping over but gets better with standing x 2 months.Pain also worst sometimes with lying down unless she gets in a right position.[place]urnts on right side of the lower back and down to hip.she denies any radiation to leg,numbness,tingling or weakness of leg.No injury to hip.   Past Medical History:  Diagnosis Date  . Disorder of bone and cartilage, unspecified   . Disorders of bursae and tendons in shoulder region, unspecified   . Encounter for long-term (current) use of other medications   . Esophageal reflux   . Hyperosmolality and/or  hypernatremia   . Osteoporosis, unspecified   . Other and unspecified hyperlipidemia   . Pain in joint, shoulder region   . Pain in limb   . Palpitations   . Pure hypercholesterolemia   . Reflux esophagitis   . Tachycardia, unspecified   . Unspecified essential hypertension   . Unspecified urinary incontinence    Past Surgical History:  Procedure Laterality Date  . APPENDECTOMY  1955  . Bone Density  08/14/2012  . CATARACT EXTRACTION Bilateral 2010  . CHOLECYSTECTOMY  2004  . COLONOSCOPY  03/31/2009   Diverticulosis, Dr.John Amedeo Plenty   . DIAGNOSTIC MAMMOGRAM    . LAPAROSCOPY N/A 02/15/2018   Procedure: LAPAROSCOPY DIAGNOSTIC, LYSIS OF ADHESION;  Surgeon: Ralene Ok, MD;  Location: [facility];  Service: General;  Laterality: N/A;    Allergies  Allergen Reactions  . Shellfish Allergy Nausea And Vomiting    Outpatient Encounter Medications as of 04/17/2020  Medication Sig  . acetaminophen (TYLENOL) 650 MG CR tablet Take 650 mg by mouth as needed.   Marland Kitchen aspirin EC 81 MG tablet Take 81 mg by mouth daily.  Marland Kitchen atenolol (TENORMIN) 25 MG tablet TAKE 1 TABLET BY MOUTH EVERYDAY AT BEDTIME  . Biotin 5000 MCG CAPS Take by mouth daily.  . Cholecalciferol (VITAMIN D) 2000 UNITS tablet Take 2,000 Units by mouth daily.   . pantoprazole (PROTONIX) 40 MG tablet Take 1 tablet (40 mg total) by mouth daily.  . simvastatin (ZOCOR) 40 MG tablet TAKE 1 TABLET BY MOUTH DAILY AT 6:00PM  . [DISCONTINUED] calcium carbonate (CALCIUM 600) 600 MG TABS  tablet Take 600 mg by mouth 2 (two) times daily with a meal.    No facility-administered encounter medications on file as of 04/17/2020.    Review of Systems  Constitutional: Negative for appetite change, chills, fatigue and fever.  Respiratory: Negative for cough, chest tightness, shortness of breath and wheezing.   Cardiovascular: Negative for chest pain, palpitations and leg swelling.  Gastrointestinal: Negative for abdominal distention, abdominal pain,  constipation, diarrhea, nausea and vomiting.  Musculoskeletal: Positive for arthralgias and back pain. Negative for gait problem, joint swelling and myalgias.  Neurological: Negative for dizziness, speech difficulty, weakness, light-headedness, numbness and headaches.    Immunization History  Administered Date(s) Administered  . Fluad Quad(high Dose 65+) 07/09/2019  . Influenza, High Dose Seasonal PF 07/12/2017, 06/22/2018  . Influenza,inj,Quad PF,6+ Mos 07/03/2013, 07/30/2015, 08/01/2016  . Influenza-Unspecified 07/03/2012, 06/12/2014  . PFIZER SARS-COV-2 Vaccination 10/02/2019, 10/20/2019  . Pneumococcal Conjugate-13 07/23/2014  . Pneumococcal Polysaccharide-23 02/20/2008  . Tdap 07/05/2011  . Zoster 07/28/2011  . Zoster Recombinat (Shingrix) 09/06/2018, 03/27/2019   Pertinent  Health Maintenance Due  Topic Date Due  . INFLUENZA VACCINE  04/12/2020  . MAMMOGRAM  05/16/2020  . DEXA SCAN  Completed  . PNA vac Low Risk Adult  Completed   Fall Risk  04/17/2020 02/21/2020 11/28/2019 08/23/2019 02/12/2019  Falls in the past year? 0 0 0 0 0  Number falls in past yr: 0 0 0 0 0  Injury with Fall? 0 0 0 0 0    Vitals:   04/17/20 1513  BP: 138/82  Pulse: 90  Resp: 16  Temp: 98.4 F (36.9 C)  SpO2: 98%  Weight: 156 lb 12.8 oz (71.1 kg)  Height: 4' 10"  (1.473 m)   Body mass index is 32.77 kg/m. Physical Exam Vitals reviewed.  Constitutional:      General: She is not in acute distress.    Appearance: She is not ill-appearing.  Cardiovascular:     Rate and Rhythm: Normal rate and regular rhythm.     Pulses: Normal pulses.     Heart sounds: Normal heart sounds. No murmur heard.  No friction rub. No gallop.   Pulmonary:     Effort: Pulmonary effort is normal. No respiratory distress.     Breath sounds: Normal breath sounds. No wheezing or rhonchi.  Chest:     Chest wall: No tenderness.  Abdominal:     General: Bowel sounds are normal. There is no distension.     Palpations:  Abdomen is soft. There is no mass.     Tenderness: There is no abdominal tenderness. There is no right CVA tenderness, left CVA tenderness, guarding or rebound.  Musculoskeletal:     Cervical back: Normal range of motion. No rigidity or tenderness.  Lymphadenopathy:     Cervical: No cervical adenopathy.  Skin:    General: Skin is warm.     Coloration: Skin is not pale.     Findings: No erythema.  Neurological:     Mental Status: She is alert and oriented to person, place, and time.     Sensory: No sensory deficit.     Motor: No weakness.     Coordination: Coordination normal.     Gait: Gait normal.  Psychiatric:        Mood and Affect: Mood normal.        Behavior: Behavior normal.        Thought Content: Thought content normal.        Judgment: Judgment normal.  Right Ankle Exam  Right ankle exam is normal.  Range of Motion  The patient has normal right ankle ROM.  Muscle Strength  The patient has normal right ankle strength.   Left Ankle Exam  Left ankle exam is normal.  Range of Motion  The patient has normal left ankle ROM.   Muscle Strength  The patient has normal left ankle strength.   Right Knee Exam  Right knee exam is normal.  Range of Motion  The patient has normal right knee ROM.   Left Knee Exam  Left knee exam is normal.  Range of Motion  The patient has normal left knee ROM.   Right Hip Exam   Tenderness  The patient is experiencing tenderness in the greater trochanter.  Range of Motion  The patient has normal right hip ROM.  Muscle Strength  The patient has normal right hip strength.  Other  Erythema: absent Sensation: normal Pulse: present   Left Hip Exam  Left hip exam is normal.  Range of Motion  The patient has normal left hip ROM.  Muscle Strength  The patient has normal left hip strength.   Other  Erythema: absent Sensation: normal Pulse: present   Back Exam   Tenderness  The patient is experiencing  tenderness in the lumbar.  Range of Motion  The patient has normal back ROM.  Muscle Strength  The patient has normal back strength.  Other  Gait: normal      Labs reviewed: Recent Labs    08/27/19 0818 02/21/20 0953  NA 141 141  K 4.6 4.4  CL 105 106  CO2 29 28  GLUCOSE 84 83  BUN 19 15  CREATININE 0.75 0.71  CALCIUM 10.6* 10.5*   Recent Labs    08/27/19 0818 02/21/20 0953  AST 15 15  ALT 10 9  BILITOT 0.4 0.5  PROT 6.7 6.4   Recent Labs    08/27/19 0818 02/21/20 0953  WBC 5.3 5.7  NEUTROABS 3,350 3,591  HGB 12.6 11.8  HCT 34.6* 35.4  MCV 93.3 91.7  PLT 295 275   No results found for: TSH No results found for: HGBA1C Lab Results  Component Value Date   CHOL 189 02/21/2020   HDL 61 02/21/2020   LDLCALC 105 (H) 02/21/2020   TRIG 134 02/21/2020   CHOLHDL 3.1 02/21/2020    Significant Diagnostic Results in last 30 days:  No results found.  Assessment/Plan 1. Acute right-sided low back pain with right-sided sciatica Pain consistent with sciatic nerve.will treat with tapered Prednisone has required prednisone in the past.Instruction given as below and side effects discussed  verbalized understanding. - predniSONE (DELTASONE) 10 MG tablet; Take 40 mg  (4 ) tablet by mouth x 1 day then  Take 30 mg ( 3) tablet by mouth x 1 day  Take 20 mg ( 2 ) tablet by mouth x 1 day then  Take 10 mg tablet by mouth x 1 day and stop.  Dispense: 10 tablet; Refill: 0  2. Right hip pain Tapered prednisone as above.refer to Orthopedic for further evaluation.  - Ambulatory referral to Orthopedic Surgery  Family/ staff Communication: Reviewed plan of care with patient verbalized understanding.  Labs/tests ordered: None   Next Appointment: As needed if symptoms worsen or fail to improve.   Sandrea Hughs, NP

## 2020-05-06 ENCOUNTER — Ambulatory Visit (INDEPENDENT_AMBULATORY_CARE_PROVIDER_SITE_OTHER): Payer: Medicare Other

## 2020-05-06 ENCOUNTER — Encounter: Payer: Self-pay | Admitting: Orthopaedic Surgery

## 2020-05-06 ENCOUNTER — Ambulatory Visit (INDEPENDENT_AMBULATORY_CARE_PROVIDER_SITE_OTHER): Payer: Medicare Other | Admitting: Orthopaedic Surgery

## 2020-05-06 DIAGNOSIS — M5441 Lumbago with sciatica, right side: Secondary | ICD-10-CM | POA: Diagnosis not present

## 2020-05-06 MED ORDER — PREDNISONE 10 MG (21) PO TBPK: ORAL_TABLET | ORAL | 0 refills | Status: DC

## 2020-05-06 NOTE — Progress Notes (Signed)
Office Visit Note   Patient: Brooke Cox           Date of Birth: 26-Jul-1941           MRN: [id number] Visit Date: 05/06/2020              Requested by: Sandrea Hughs, NP [address] [place],  [place] [address] PCP: [name], NP   Assessment & Plan: Visit Diagnoses:  1. Right-sided low back pain with right-sided sciatica, unspecified chronicity     Plan: Impression is chronic right lower back pain and right lower extremity radiculopathy.  I started the patient on a Sterapred taper and sent in an internal referral for physical therapy.  If she is not any better in the next 6 to 8 weeks she will call us and let us know reviewed her follow-up and we will then obtain an MRI of her lumbar spine.  Follow-Up Instructions: Return if symptoms worsen or fail to improve.   Orders:  Orders Placed This Encounter  Procedures  . XR Lumbar Spine 2-3 Views  . Ambulatory referral to Physical Therapy   Meds ordered this encounter  Medications  . predniSONE (STERAPRED UNI-PAK 21 TAB) 10 MG (21) TBPK tablet    Sig: Take as directed    Dispense:  21 tablet    Refill:  0      Procedures: No procedures performed   Clinical Data: No additional findings.   Subjective: Chief Complaint  Patient presents with  . Lower Back - Pain    HPI patient is a pleasant 79 year old female who comes in today with right lower back pain.  She notes that this is been ongoing for the past few months.  No known injury or change in activity.  Her pain is progressively worsened.  Pain is to the right lower back and radiates down the lateral hip to the knee.  No pain distal to the knee.  No anterior thigh or groin pain.  She describes really more of a burn than actual pain.  She denies any weakness to the right leg.  She has increased symptoms with lumbar flexion.  She has recently tried a prednisone for 4 days without relief.  Tylenol has been of no relief.  She started taking Aleve this past  Saturday which has helped quite a bit.  No bowel or bladder change and no saddle paresthesias.  Review of Systems as detailed in HPI.  All others reviewed and are negative.  Well-developed   Objective: Vital Signs: There were no vitals taken for this visit.  Physical Exam well-developed and well-nourished female no acute distress.  Alert and oriented x3.  Ortho Exam examination of her lumbar spine reveals no spinous or paraspinous tenderness.  Negative straight leg raise.  Negative logroll and negative FADIR.  No tenderness to the greater trochanter.  Full strength throughout.  She is neurovascular intact distally.  Specialty Comments:  No specialty comments available.  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 Chronic diastolic (congestive) heart failure ([place]) 02/12/2019  . Ileitis, terminal ([place]) 02/09/2018  . SBO (small bowel obstruction) s/p lap adhesiolysis 02/15/2018 02/08/2018  . Obesity (BMI 30.0-34.9) 01/30/2017  . LBBB (left bundle branch block) 08/27/2014  . Osteopenia 07/23/2014  . Intraventricular conduction delay 07/23/2014  . Intraventricular block 07/23/2014  . Sigmoid diverticulosis 07/23/2014  .  Benign hypertensive heart disease without heart failure 07/03/2013  . GERD (gastroesophageal reflux disease) 07/03/2013  . Right leg pain 07/03/2013  . Impacted cerumen of left ear 07/03/2013  . Routine general medical examination at a health care facility 07/03/2013  . Hyperlipidemia with target LDL less than 130 07/03/2013   Past Medical History:  Diagnosis Date  . Disorder of bone and cartilage, unspecified   . Disorders of bursae and tendons in shoulder region, unspecified   . Encounter for long-term (current) use of other medications   . Esophageal reflux   . Hyperosmolality and/or hypernatremia   . Osteoporosis,  unspecified   . Other and unspecified hyperlipidemia   . Pain in joint, shoulder region   . Pain in limb   . Palpitations   . Pure hypercholesterolemia   . Reflux esophagitis   . Tachycardia, unspecified   . Unspecified essential hypertension   . Unspecified urinary incontinence     Family History  Problem Relation Age of Onset  . Hypertension Mother   . Diabetes Brother   . Crohn's disease Neg Hx   . Inflammatory bowel disease Neg Hx     Past Surgical History:  Procedure Laterality Date  . APPENDECTOMY  1955  . Bone Density  08/14/2012  . CATARACT EXTRACTION Bilateral 2010  . CHOLECYSTECTOMY  2004  . COLONOSCOPY  03/31/2009   Diverticulosis, Dr.John Amedeo Plenty   . DIAGNOSTIC MAMMOGRAM    . LAPAROSCOPY N/A 02/15/2018   Procedure: LAPAROSCOPY DIAGNOSTIC, LYSIS OF ADHESION;  Surgeon: Ralene Ok, MD;  Location: [facility];  Service: General;  Laterality: N/A;   Social History   Occupational History  . Occupation: retired  Tobacco Use  . Smoking status: Never Smoker  . Smokeless tobacco: Never Used  Vaping Use  . Vaping Use: Never used  Substance and Sexual Activity  . Alcohol use: No  . Drug use: No  . Sexual activity: Not Currently

## 2020-05-07 ENCOUNTER — Encounter: Payer: Self-pay | Admitting: Physical Therapy

## 2020-05-07 ENCOUNTER — Ambulatory Visit (INDEPENDENT_AMBULATORY_CARE_PROVIDER_SITE_OTHER): Payer: Medicare Other | Admitting: Physical Therapy

## 2020-05-07 ENCOUNTER — Other Ambulatory Visit: Payer: Self-pay

## 2020-05-07 DIAGNOSIS — M5416 Radiculopathy, lumbar region: Secondary | ICD-10-CM | POA: Diagnosis not present

## 2020-05-07 DIAGNOSIS — M6281 Muscle weakness (generalized): Secondary | ICD-10-CM

## 2020-05-07 DIAGNOSIS — R293 Abnormal posture: Secondary | ICD-10-CM | POA: Diagnosis not present

## 2020-05-07 NOTE — Therapy (Signed)
[facility] Physical Therapy [address] [place], [place], [address] Phone: [phone]   Fax:  [phone]  Physical Therapy Evaluation  Patient Details  Name: Brooke Cox MRN: [id number] Date of Birth: Sep 14, 1940 Referring Provider (PT): Aundra Dubin, [place]   Encounter Date: 05/07/2020   PT End of Session - 05/07/20 1021    Visit Number 1    Number of Visits 12    Date for PT Re-Evaluation 06/18/20    PT Start Time 0931    PT Stop Time 1010    PT Time Calculation (min) 39 min    Activity Tolerance Patient tolerated treatment well    Behavior During Therapy [facility] for tasks assessed/performed           Past Medical History:  Diagnosis Date  . Disorder of bone and cartilage, unspecified   . Disorders of bursae and tendons in shoulder region, unspecified   . Encounter for long-term (current) use of other medications   . Esophageal reflux   . Hyperosmolality and/or hypernatremia   . Osteoporosis, unspecified   . Other and unspecified hyperlipidemia   . Pain in joint, shoulder region   . Pain in limb   . Palpitations   . Pure hypercholesterolemia   . Reflux esophagitis   . Tachycardia, unspecified   . Unspecified essential hypertension   . Unspecified urinary incontinence     Past Surgical History:  Procedure Laterality Date  . APPENDECTOMY  1955  . Bone Density  08/14/2012  . CATARACT EXTRACTION Bilateral 2010  . CHOLECYSTECTOMY  2004  . COLONOSCOPY  03/31/2009   Diverticulosis, Dr.John Amedeo Plenty   . DIAGNOSTIC MAMMOGRAM    . LAPAROSCOPY N/A 02/15/2018   Procedure: LAPAROSCOPY DIAGNOSTIC, LYSIS OF ADHESION;  Surgeon: Ralene Ok, MD;  Location: [facility];  Service: General;  Laterality: N/A;    There were no vitals filed for this visit.    Subjective Assessment - 05/07/20 0935    Subjective Pt is an 79 y/o female who presents to OPPT for Rt side LBP with symptoms radiating down RLE.  She reports symptoms began in June and pain has  progressively gotten worse, and aleve has been the most helpful with pain.  She's unsure of the MOI, but does recall doing lots of yardwork with heavy lifting.    Pertinent History osteoporosis, HTN, CHF    Limitations Sitting    How long can you sit comfortably? feels it when sitting down    Diagnostic tests xrays: arthritis in spine    Patient Stated Goals improve pain    Currently in Pain? Yes    Pain Score 0-No pain   up to 9/10; recently 2-3/10   Pain Location Back    Pain Orientation Right    Pain Descriptors / Indicators Burning    Pain Type Acute pain    Pain Radiating Towards RLE to knee    Pain Onset 1 to 4 weeks ago    Pain Frequency Intermittent    Aggravating Factors  bending forward    Pain Relieving Factors standing up and "walking it out"              [facility] PT Assessment - 05/07/20 0939      Assessment   Medical Diagnosis M54.41 (ICD-10-CM) - Right-sided low back pain with right-sided sciatica, unspecified chronicity    Referring Provider (PT) Aundra Dubin, PA-C    Onset Date/Surgical Date --   June 2021   Hand Dominance Right    Next  MD Visit follow up if no better in 6-8 weeks    Prior Therapy for Lt shoulder      Precautions   Precautions None      Restrictions   Weight Bearing Restrictions No      Balance Screen   Has the patient fallen in the past 6 months No    Has the patient had a decrease in activity level because of a fear of falling?  No    Is the patient reluctant to leave their home because of a fear of falling?  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; no regular exercise - was going to [employer]   Overall Cognitive Status Within Functional Limits for tasks assessed      Posture/Postural Control   Posture/Postural Control Postural limitations    Postural Limitations Rounded Shoulders;Forward head      ROM / Strength   AROM / PROM / Strength AROM;Strength      AROM   AROM Assessment Site Lumbar    Lumbar Flexion limited 25% - improved symptoms with repetition    Lumbar Extension WNL    Lumbar - Right Side Bend WNL    Lumbar - Left Side Bend WNL    Lumbar - Right Rotation WNL    Lumbar - Left Rotation WNL      Strength   Strength Assessment Site Hip;Knee;Ankle    Right/Left Hip Right;Left    Right Hip Flexion 5/5    Right Hip ABduction 3+/5    Left Hip Flexion 5/5    Right/Left Knee Right;Left    Right Knee Flexion 5/5    Right Knee Extension 5/5    Left Knee Extension 5/5    Right/Left Ankle Right;Left    Right Ankle Dorsiflexion 5/5    Left Ankle Dorsiflexion 5/5      Palpation   Palpation comment mild tenderness noted at Rt SIJ      Special Tests    Special Tests Lumbar    Lumbar Tests Slump Test      Slump test   Findings Negative                      Objective measurements completed on examination: See above findings.       [facility] Adult PT Treatment/Exercise - 05/07/20 0939      Exercises   Exercises Lumbar      Lumbar Exercises: Stretches   Passive Hamstring Stretch Right;1 rep;30 seconds    Single Knee to Chest Stretch Right;1 rep;30 seconds    Piriformis Stretch Right;1 rep;30 seconds                  PT Education - 05/07/20 1021    Education Details HEP    Person(s) Educated Patient    Methods Explanation;Demonstration;Handout    Comprehension Verbalized understanding;Returned demonstration;Need further instruction  PT Short Term Goals - 05/07/20 1028      PT SHORT TERM GOAL #1   Title report no  episodes of radicular pain    Status New    Target Date 05/21/20             PT Long Term Goals - 05/07/20 1025      PT LONG TERM GOAL #1   Title independent with HEP    Status New    Target Date 06/18/20      PT LONG TERM GOAL #2   Title demonstrate 4/5 Rt hip abdct strength for improved function    Status New    Target Date 06/18/20      PT LONG TERM GOAL #3   Title perform lumbar flexion without increase in pain    Status New    Target Date 06/18/20      PT LONG TERM GOAL #4   Title report pain < 2/10 with daily activities for improved function    Status New    Target Date 06/18/20                  Plan - 05/07/20 1021    Clinical Impression Statement Pt is a 79 y/o female who presents to OPPT for acute LBP with Rt sided radiculopathy.  Symptoms at this time have largely centralized, and pt demonstrates some mild ROM, strength and flexibility limitations.  She's currently on a prednisone taper so she may see a change in current status once she completes course.  She will benefit from PT to address deficits listed.    Personal Factors and Comorbidities Comorbidity 2    Comorbidities osteoporosis, HTN    Examination-Activity Limitations Stand;Locomotion Level;Sit;Transfers    Examination-Participation Restrictions Yard Work;Meal Prep;Community Activity    Stability/Clinical Decision Making Stable/Uncomplicated    Clinical Decision Making Low    Rehab Potential Good    PT Frequency 2x / week   1-2x/wk   PT Duration 6 weeks    PT Treatment/Interventions ADLs/Self Care Home Management;Cryotherapy;Electrical Stimulation;Traction;Moist Heat;Gait training;Stair training;Functional mobility training;Therapeutic activities;Therapeutic exercise;Neuromuscular re-education;Patient/family education;Manual techniques;Passive range of motion;Dry needling;Taping    PT Next Visit Plan review HEP, add core/hip strength exercises, see how she's doing after weaning from prednisone     PT Home Exercise Plan Access Code: [email or web address]    Consulted and Agree with Plan of Care Patient           Patient will benefit from skilled therapeutic intervention in order to improve the following deficits and impairments:  Pain, Postural dysfunction, Impaired flexibility, Decreased strength, Decreased range of motion  Visit Diagnosis: Radiculopathy, lumbar region - Plan: PT plan of care cert/re-cert  Abnormal posture - Plan: PT plan of care cert/re-cert  Muscle weakness (generalized) - Plan: PT plan of care cert/re-cert     Problem List Patient Active Problem List   Diagnosis Date Noted  . Chronic diastolic (congestive) heart failure ([place]) 02/12/2019  . Ileitis, terminal ([place]) 02/09/2018  . SBO (small bowel obstruction) s/p lap adhesiolysis 02/15/2018 02/08/2018  . Obesity (BMI 30.0-34.9) 01/30/2017  . LBBB (left bundle branch block) 08/27/2014  . Osteopenia 07/23/2014  . Intraventricular conduction delay 07/23/2014  . Intraventricular block 07/23/2014  . Sigmoid diverticulosis 07/23/2014  . Benign hypertensive heart disease without heart failure 07/03/2013  . GERD (gastroesophageal reflux disease) 07/03/2013  . Right leg pain 07/03/2013  . Impacted cerumen of left ear 07/03/2013  . Routine general medical examination at a health care facility 07/03/2013  .  Hyperlipidemia with target LDL less than 130 07/03/2013      Laureen Abrahams, PT, DPT 05/07/20 10:38 AM    [facility] Physical Therapy [address] [place], [place], [address] Phone: [phone]   Fax:  [phone]  Name: Brooke Cox MRN: [id number] Date of Birth: [birth year]

## 2020-05-07 NOTE — Patient Instructions (Signed)
Access Code: [email or web address] URL: [email or web address] Date: 05/07/2020 Prepared by: Faustino Congress  Exercises Hooklying Single Knee to Chest - 2 x daily - 7 x weekly - 3 reps - 1 sets - 30 sec hold Supine Piriformis Stretch with Foot on Ground - 2 x daily - 7 x weekly - 3 reps - 1 sets - 30 sec hold Hooklying Hamstring Stretch with Strap - 1 x daily - 7 x weekly - 3 sets - 10 reps

## 2020-05-11 ENCOUNTER — Other Ambulatory Visit: Payer: Self-pay

## 2020-05-11 ENCOUNTER — Ambulatory Visit (INDEPENDENT_AMBULATORY_CARE_PROVIDER_SITE_OTHER): Payer: Medicare Other | Admitting: Physical Therapy

## 2020-05-11 DIAGNOSIS — M5416 Radiculopathy, lumbar region: Secondary | ICD-10-CM

## 2020-05-11 DIAGNOSIS — R293 Abnormal posture: Secondary | ICD-10-CM

## 2020-05-11 DIAGNOSIS — M6281 Muscle weakness (generalized): Secondary | ICD-10-CM | POA: Diagnosis not present

## 2020-05-11 NOTE — Therapy (Signed)
[facility] Physical Therapy [address] [place], [place], [address] Phone: [phone]   Fax:  [phone]  Physical Therapy Treatment  Patient Details  Name: Brooke Cox MRN: [id number] Date of Birth: 1941/09/08 Referring Provider (PT): Aundra Dubin, [place]   Encounter Date: 05/11/2020   PT End of Session - 05/11/20 1521    Visit Number 2    Number of Visits 12    Date for PT Re-Evaluation 06/18/20    PT Start Time 5465    PT Stop Time 1512    PT Time Calculation (min) 40 min    Activity Tolerance Patient tolerated treatment well    Behavior During Therapy [facility] for tasks assessed/performed           Past Medical History:  Diagnosis Date  . Disorder of bone and cartilage, unspecified   . Disorders of bursae and tendons in shoulder region, unspecified   . Encounter for long-term (current) use of other medications   . Esophageal reflux   . Hyperosmolality and/or hypernatremia   . Osteoporosis, unspecified   . Other and unspecified hyperlipidemia   . Pain in joint, shoulder region   . Pain in limb   . Palpitations   . Pure hypercholesterolemia   . Reflux esophagitis   . Tachycardia, unspecified   . Unspecified essential hypertension   . Unspecified urinary incontinence     Past Surgical History:  Procedure Laterality Date  . APPENDECTOMY  1955  . Bone Density  08/14/2012  . CATARACT EXTRACTION Bilateral 2010  . CHOLECYSTECTOMY  2004  . COLONOSCOPY  03/31/2009   Diverticulosis, Dr.John Amedeo Plenty   . DIAGNOSTIC MAMMOGRAM    . LAPAROSCOPY N/A 02/15/2018   Procedure: LAPAROSCOPY DIAGNOSTIC, LYSIS OF ADHESION;  Surgeon: Ralene Ok, MD;  Location: [facility];  Service: General;  Laterality: N/A;    There were no vitals filed for this visit.   Subjective Assessment - 05/11/20 1508    Subjective Relays overall her back is doing better, denies radiculopathy, says the pain is about 4/10 pain in her central lumbar    Pertinent History  osteoporosis, HTN, CHF    Limitations Sitting    How long can you sit comfortably? feels it when sitting down    Diagnostic tests xrays: arthritis in spine    Patient Stated Goals improve pain    Pain Onset 1 to 4 weeks ago             [facility] Adult PT Treatment/Exercise - 05/11/20 0001      Lumbar Exercises: Stretches   Passive Hamstring Stretch Right;Left;2 reps;30 seconds    Single Knee to Chest Stretch Right;Left;2 reps;30 seconds    Piriformis Stretch Right;Left;2 reps;30 seconds    Other Lumbar Stretch Exercise seated lumbar flexion stretch p ball 10 sec X 10 reps      Lumbar Exercises: Aerobic   Recumbent Bike L2 X 6 min      Lumbar Exercises: Standing   Other Standing Lumbar Exercises hip abd and hip ext both with 2 lbs X 15 reps ea bilat      Lumbar Exercises: Seated   Long Arc Quad on Chair Strengthening;Both;15 reps    LAQ on Chair Weights (lbs) 2      Lumbar Exercises: Supine   Clam 20 reps    Clam Limitations green    Bridge 15 reps    Straight Leg Raise 10 reps   bilat   Other Supine Lumbar Exercises supine marches with green band X 15  reps bilat                  PT Education - 05/11/20 1521    Education Details HEP review    Person(s) Educated Patient    Methods Explanation;Demonstration;Verbal cues    Comprehension Verbalized understanding;Returned demonstration            PT Short Term Goals - 05/07/20 1028      PT SHORT TERM GOAL #1   Title report no episodes of radicular pain    Status New    Target Date 05/21/20             PT Long Term Goals - 05/07/20 1025      PT LONG TERM GOAL #1   Title independent with HEP    Status New    Target Date 06/18/20      PT LONG TERM GOAL #2   Title demonstrate 4/5 Rt hip abdct strength for improved function    Status New    Target Date 06/18/20      PT LONG TERM GOAL #3   Title perform lumbar flexion without increase in pain    Status New    Target Date 06/18/20      PT LONG TERM  GOAL #4   Title report pain < 2/10 with daily activities for improved function    Status New    Target Date 06/18/20                 Plan - 05/11/20 1524    Clinical Impression Statement She is having overall less pain and was able to progress her lumbar/hip stretching and strengthening today with good tolerance. No radiculopathy reported during session.    Personal Factors and Comorbidities Comorbidity 2    Comorbidities osteoporosis, HTN    Examination-Activity Limitations Stand;Locomotion Level;Sit;Transfers    Examination-Participation Restrictions Yard Work;Meal Prep;Community Activity    Stability/Clinical Decision Making Stable/Uncomplicated    Rehab Potential Good    PT Frequency 2x / week   1-2x/wk   PT Duration 6 weeks    PT Treatment/Interventions ADLs/Self Care Home Management;Cryotherapy;Electrical Stimulation;Traction;Moist Heat;Gait training;Stair training;Functional mobility training;Therapeutic activities;Therapeutic exercise;Neuromuscular re-education;Patient/family education;Manual techniques;Passive range of motion;Dry needling;Taping    PT Next Visit Plan review HEP, add core/hip strength exercises, see how she's doing after weaning from prednisone    PT Home Exercise Plan Access Code: [email or web address]    Consulted and Agree with Plan of Care Patient           Patient will benefit from skilled therapeutic intervention in order to improve the following deficits and impairments:  Pain, Postural dysfunction, Impaired flexibility, Decreased strength, Decreased range of motion  Visit Diagnosis: Radiculopathy, lumbar region  Abnormal posture  Muscle weakness (generalized)     Problem List Patient Active Problem List   Diagnosis Date Noted  . Chronic diastolic (congestive) heart failure ([place]) 02/12/2019  . Ileitis, terminal ([place]) 02/09/2018  . SBO (small bowel obstruction) s/p lap adhesiolysis 02/15/2018 02/08/2018  . Obesity (BMI 30.0-34.9) 01/30/2017  . LBBB  (left bundle branch block) 08/27/2014  . Osteopenia 07/23/2014  . Intraventricular conduction delay 07/23/2014  . Intraventricular block 07/23/2014  . Sigmoid diverticulosis 07/23/2014  . Benign hypertensive heart disease without heart failure 07/03/2013  . GERD (gastroesophageal reflux disease) 07/03/2013  . Right leg pain 07/03/2013  . Impacted cerumen of left ear 07/03/2013  . Routine general medical examination at a health care facility 07/03/2013  . Hyperlipidemia with target LDL less than 130  07/03/2013    Debbe Odea ,PT,DPT 05/11/2020, 3:29 PM  [facility] Physical Therapy [address] [place], [place], [address] Phone: [phone]   Fax:  [phone]  Name: Taylan Mayhan MRN: [id number] Date of Birth: 1941-08-13

## 2020-05-14 ENCOUNTER — Other Ambulatory Visit: Payer: Self-pay | Admitting: Nurse Practitioner

## 2020-05-14 DIAGNOSIS — K219 Gastro-esophageal reflux disease without esophagitis: Secondary | ICD-10-CM

## 2020-05-26 DIAGNOSIS — Z1231 Encounter for screening mammogram for malignant neoplasm of breast: Secondary | ICD-10-CM | POA: Diagnosis not present

## 2020-05-26 LAB — HM MAMMOGRAPHY

## 2020-05-27 ENCOUNTER — Encounter: Payer: Self-pay | Admitting: Nurse Practitioner

## 2020-05-27 ENCOUNTER — Other Ambulatory Visit: Payer: Self-pay

## 2020-05-27 ENCOUNTER — Ambulatory Visit (INDEPENDENT_AMBULATORY_CARE_PROVIDER_SITE_OTHER): Payer: Medicare Other | Admitting: Physical Therapy

## 2020-05-27 ENCOUNTER — Encounter: Payer: Self-pay | Admitting: Physical Therapy

## 2020-05-27 DIAGNOSIS — R293 Abnormal posture: Secondary | ICD-10-CM | POA: Diagnosis not present

## 2020-05-27 DIAGNOSIS — M5416 Radiculopathy, lumbar region: Secondary | ICD-10-CM | POA: Diagnosis not present

## 2020-05-27 DIAGNOSIS — M6281 Muscle weakness (generalized): Secondary | ICD-10-CM

## 2020-05-27 NOTE — Therapy (Addendum)
[facility] Physical Therapy [address] [place], [place], [address] Phone: [phone]   Fax:  [phone]  Physical Therapy Treatment/Discharge addendum PHYSICAL THERAPY DISCHARGE SUMMARY  Visits from Start of Care: 3  Current functional level related to goals / functional outcomes: See below   Remaining deficits: See below   Education / Equipment: HEP Plan: Patient agrees to discharge.  Patient goals were met. Patient is being discharged due to meeting the stated rehab goals.  ?????  Elsie Ra, PT, DPT 06/30/20 8:44 AM      Patient Details  Name: Brooke Cox MRN: [id number] Date of Birth: 1941-02-05 Referring Provider (PT): Aundra Dubin, [place]   Encounter Date: 05/27/2020   PT End of Session - 05/27/20 1550    Visit Number 3    Number of Visits 12    Date for PT Re-Evaluation 06/18/20    PT Start Time 1430    PT Stop Time 1500    PT Time Calculation (min) 30 min    Activity Tolerance Patient tolerated treatment well    Behavior During Therapy [facility] for tasks assessed/performed           Past Medical History:  Diagnosis Date  . Disorder of bone and cartilage, unspecified   . Disorders of bursae and tendons in shoulder region, unspecified   . Encounter for long-term (current) use of other medications   . Esophageal reflux   . Hyperosmolality and/or hypernatremia   . Osteoporosis, unspecified   . Other and unspecified hyperlipidemia   . Pain in joint, shoulder region   . Pain in limb   . Palpitations   . Pure hypercholesterolemia   . Reflux esophagitis   . Tachycardia, unspecified   . Unspecified essential hypertension   . Unspecified urinary incontinence     Past Surgical History:  Procedure Laterality Date  . APPENDECTOMY  1955  . Bone Density  08/14/2012  . CATARACT EXTRACTION Bilateral 2010  . CHOLECYSTECTOMY  2004  . COLONOSCOPY  03/31/2009   Diverticulosis, Dr.John Amedeo Plenty   . DIAGNOSTIC MAMMOGRAM    .  LAPAROSCOPY N/A 02/15/2018   Procedure: LAPAROSCOPY DIAGNOSTIC, LYSIS OF ADHESION;  Surgeon: Ralene Ok, MD;  Location: [facility];  Service: General;  Laterality: N/A;    There were no vitals filed for this visit.   Subjective Assessment - 05/27/20 1431    Subjective doing well, hasn't had a twinge in at least 3-4 days    Pertinent History osteoporosis, HTN, CHF    Limitations Sitting    How long can you sit comfortably? feels it when sitting down    Diagnostic tests xrays: arthritis in spine    Patient Stated Goals improve pain    Currently in Pain? No/denies    Pain Onset 1 to 4 weeks ago              [facility] PT Assessment - 05/27/20 1454      Assessment   Medical Diagnosis M54.41 (ICD-10-CM) - Right-sided low back pain with right-sided sciatica, unspecified chronicity    Referring Provider (PT) Aundra Dubin, PA-C      AROM   Lumbar Flexion limited 25% - no pain    Lumbar Extension WNL    Lumbar - Right Side Bend WNL    Lumbar - Left Side Bend WNL    Lumbar - Right Rotation WNL    Lumbar - Left Rotation WNL      Strength   Right Hip ABduction 5/5  [facility] Adult PT Treatment/Exercise - 05/27/20 1432      Lumbar Exercises: Stretches   Passive Hamstring Stretch Right;Left;2 reps;30 seconds    Single Knee to Chest Stretch Right;Left;2 reps;30 seconds    Piriformis Stretch Right;Left;2 reps;30 seconds      Lumbar Exercises: Aerobic   Recumbent Bike L5 x 6 min                    PT Short Term Goals - 05/27/20 1551      PT SHORT TERM GOAL #1   Title report no episodes of radicular pain    Status Achieved    Target Date 05/21/20             PT Long Term Goals - 05/27/20 1551      PT LONG TERM GOAL #1   Title independent with HEP    Status Achieved      PT LONG TERM GOAL #2   Title demonstrate 4/5 Rt hip abdct strength for improved function    Status Achieved      PT LONG TERM GOAL #3   Title perform  lumbar flexion without increase in pain    Status Achieved      PT LONG TERM GOAL #4   Title report pain < 2/10 with daily activities for improved function    Status Achieved                 Plan - 05/27/20 1551    Clinical Impression Statement Pt has met all goals and has had no episodes of pain for at least 4 days.  Plan to hold PT unless symptoms return in next 30 days, otherwise will d/c PT.    Personal Factors and Comorbidities Comorbidity 2    Comorbidities osteoporosis, HTN    Examination-Activity Limitations Stand;Locomotion Level;Sit;Transfers    Examination-Participation Restrictions Yard Work;Meal Prep;Community Activity    Stability/Clinical Decision Making Stable/Uncomplicated    Rehab Potential Good    PT Frequency 2x / week   1-2x/wk   PT Duration 6 weeks    PT Treatment/Interventions ADLs/Self Care Home Management;Cryotherapy;Electrical Stimulation;Traction;Moist Heat;Gait training;Stair training;Functional mobility training;Therapeutic activities;Therapeutic exercise;Neuromuscular re-education;Patient/family education;Manual techniques;Passive range of motion;Dry needling;Taping    PT Next Visit Plan hold x 30 days; reassess if she returns, othewise d/c PT    PT Home Exercise Plan Access Code: [email or web address]    Consulted and Agree with Plan of Care Patient           Patient will benefit from skilled therapeutic intervention in order to improve the following deficits and impairments:  Pain, Postural dysfunction, Impaired flexibility, Decreased strength, Decreased range of motion  Visit Diagnosis: Radiculopathy, lumbar region  Abnormal posture  Muscle weakness (generalized)     Problem List Patient Active Problem List   Diagnosis Date Noted  . Chronic diastolic (congestive) heart failure ([place]) 02/12/2019  . Ileitis, terminal ([place]) 02/09/2018  . SBO (small bowel obstruction) s/p lap adhesiolysis 02/15/2018 02/08/2018  . Obesity (BMI 30.0-34.9) 01/30/2017  .  LBBB (left bundle branch block) 08/27/2014  . Osteopenia 07/23/2014  . Intraventricular conduction delay 07/23/2014  . Intraventricular block 07/23/2014  . Sigmoid diverticulosis 07/23/2014  . Benign hypertensive heart disease without heart failure 07/03/2013  . GERD (gastroesophageal reflux disease) 07/03/2013  . Right leg pain 07/03/2013  . Impacted cerumen of left ear 07/03/2013  . Routine general medical examination at a health care facility 07/03/2013  . Hyperlipidemia with target LDL less than 130 07/03/2013  Laureen Abrahams, PT, DPT 05/27/20 3:52 PM    [facility] [address] [place] [address] [place], [place], [address] Phone: [phone]   Fax:  [phone]  Name: Brooke Cox MRN: [id number] Date of Birth: [birth year]

## 2020-06-03 ENCOUNTER — Encounter: Payer: Medicare Other | Admitting: Physical Therapy

## 2020-06-10 ENCOUNTER — Other Ambulatory Visit: Payer: Self-pay

## 2020-06-10 ENCOUNTER — Ambulatory Visit (INDEPENDENT_AMBULATORY_CARE_PROVIDER_SITE_OTHER): Payer: Medicare Other | Admitting: *Deleted

## 2020-06-10 DIAGNOSIS — Z23 Encounter for immunization: Secondary | ICD-10-CM | POA: Diagnosis not present

## 2020-06-26 ENCOUNTER — Other Ambulatory Visit: Payer: Self-pay | Admitting: Nurse Practitioner

## 2020-06-26 DIAGNOSIS — E785 Hyperlipidemia, unspecified: Secondary | ICD-10-CM

## 2020-08-12 DIAGNOSIS — Z23 Encounter for immunization: Secondary | ICD-10-CM | POA: Diagnosis not present

## 2020-08-24 ENCOUNTER — Other Ambulatory Visit: Payer: Self-pay | Admitting: Nurse Practitioner

## 2020-08-24 ENCOUNTER — Encounter: Payer: Self-pay | Admitting: Nurse Practitioner

## 2020-08-24 ENCOUNTER — Telehealth: Payer: Self-pay

## 2020-08-24 ENCOUNTER — Ambulatory Visit (INDEPENDENT_AMBULATORY_CARE_PROVIDER_SITE_OTHER): Payer: Medicare Other | Admitting: Nurse Practitioner

## 2020-08-24 ENCOUNTER — Other Ambulatory Visit: Payer: Self-pay

## 2020-08-24 DIAGNOSIS — Z0000 Encounter for general adult medical examination without abnormal findings: Secondary | ICD-10-CM

## 2020-08-24 DIAGNOSIS — I119 Hypertensive heart disease without heart failure: Secondary | ICD-10-CM

## 2020-08-24 NOTE — Progress Notes (Signed)
° °  This service is provided via telemedicine  No vital signs collected/recorded due to the encounter was a telemedicine visit.   Location of patient (ex: home, work):  Home  Patient consents to a telephone visit: Yes, see telephone visit dated 08/24/20  Location of the provider (ex: office, home):  [facility], Office   Name of any referring provider:  N/A  Names of all persons participating in the telemedicine service and their role in the encounter:  S.Chrae B/CMA, Sherrie Mustache, NP, and Patient   Time spent on call:  9 min with medical assistant

## 2020-08-24 NOTE — Patient Instructions (Signed)
Brooke Cox , Thank you for taking time to come for your Medicare Wellness Visit. I appreciate your ongoing commitment to your health goals. Please review the following plan we discussed and let me know if I can assist you in the future.   Screening recommendations/referrals: Colonoscopy aged out Mammogram up to date Bone Density up to date Recommended yearly ophthalmology/optometry visit for glaucoma screening and checkup Recommended yearly dental visit for hygiene and checkup  Vaccinations: Influenza vaccine up to date Pneumococcal vaccine up to date Tdap vaccine up to date Shingles vaccine up to date    Advanced directives: on file.   Conditions/risks identified: advanced age, hyperlipidemia, hyperlipidemia.   Next appointment: 1 year.    Preventive Care 16 Years and Older, Female Preventive care refers to lifestyle choices and visits with your health care provider that can promote health and wellness. What does preventive care include?  A yearly physical exam. This is also called an annual well check.  Dental exams once or twice a year.  Routine eye exams. Ask your health care provider how often you should have your eyes checked.  Personal lifestyle choices, including:  Daily care of your teeth and gums.  Regular physical activity.  Eating a healthy diet.  Avoiding tobacco and drug use.  Limiting alcohol use.  Practicing safe sex.  Taking low-dose aspirin every day.  Taking vitamin and mineral supplements as recommended by your health care provider. What happens during an annual well check? The services and screenings done by your health care provider during your annual well check will depend on your age, overall health, lifestyle risk factors, and family history of disease. Counseling  Your health care provider may ask you questions about your:  Alcohol use.  Tobacco use.  Drug use.  Emotional well-being.  Home and relationship well-being.  Sexual  activity.  Eating habits.  History of falls.  Memory and ability to understand (cognition).  Work and work Statistician.  Reproductive health. Screening  You may have the following tests or measurements:  Height, weight, and BMI.  Blood pressure.  Lipid and cholesterol levels. These may be checked every 5 years, or more frequently if you are over 81 years old.  Skin check.  Lung cancer screening. You may have this screening every year starting at age 40 if you have a 30-pack-year history of smoking and currently smoke or have quit within the past 15 years.  Fecal occult blood test (FOBT) of the stool. You may have this test every year starting at age 12.  Flexible sigmoidoscopy or colonoscopy. You may have a sigmoidoscopy every 5 years or a colonoscopy every 10 years starting at age 70.  Hepatitis C blood test.  Hepatitis B blood test.  Sexually transmitted disease (STD) testing.  Diabetes screening. This is done by checking your blood sugar (glucose) after you have not eaten for a while (fasting). You may have this done every 1-3 years.  Bone density scan. This is done to screen for osteoporosis. You may have this done starting at age 68.  Mammogram. This may be done every 1-2 years. Talk to your health care provider about how often you should have regular mammograms. Talk with your health care provider about your test results, treatment options, and if necessary, the need for more tests. Vaccines  Your health care provider may recommend certain vaccines, such as:  Influenza vaccine. This is recommended every year.  Tetanus, diphtheria, and acellular pertussis (Tdap, Td) vaccine. You may need a Td  booster every 10 years.  Zoster vaccine. You may need this after age 19.  Pneumococcal 13-valent conjugate (PCV13) vaccine. One dose is recommended after age 38.  Pneumococcal polysaccharide (PPSV23) vaccine. One dose is recommended after age 55. Talk to your health care  provider about which screenings and vaccines you need and how often you need them. This information is not intended to replace advice given to you by your health care provider. Make sure you discuss any questions you have with your health care provider. Document Released: 09/25/2015 Document Revised: 05/18/2016 Document Reviewed: 06/30/2015 Elsevier Interactive Patient Education  2017 [place] Prevention in the Home Falls can cause injuries. They can happen to people of all ages. There are many things you can do to make your home safe and to help prevent falls. What can I do on the outside of my home?  Regularly fix the edges of walkways and driveways and fix any cracks.  Remove anything that might make you trip as you walk through a door, such as a raised step or threshold.  Trim any bushes or trees on the path to your home.  Use bright outdoor lighting.  Clear any walking paths of anything that might make someone trip, such as rocks or tools.  Regularly check to see if handrails are loose or broken. Make sure that both sides of any steps have handrails.  Any raised decks and porches should have guardrails on the edges.  Have any leaves, snow, or ice cleared regularly.  Use sand or salt on walking paths during winter.  Clean up any spills in your garage right away. This includes oil or grease spills. What can I do in the bathroom?  Use night lights.  Install grab bars by the toilet and in the tub and shower. Do not use towel bars as grab bars.  Use non-skid mats or decals in the tub or shower.  If you need to sit down in the shower, use a plastic, non-slip stool.  Keep the floor dry. Clean up any water that spills on the floor as soon as it happens.  Remove soap buildup in the tub or shower regularly.  Attach bath mats securely with double-sided non-slip rug tape.  Do not have throw rugs and other things on the floor that can make you trip. What can I do in  the bedroom?  Use night lights.  Make sure that you have a light by your bed that is easy to reach.  Do not use any sheets or blankets that are too big for your bed. They should not hang down onto the floor.  Have a firm chair that has side arms. You can use this for support while you get dressed.  Do not have throw rugs and other things on the floor that can make you trip. What can I do in the kitchen?  Clean up any spills right away.  Avoid walking on wet floors.  Keep items that you use a lot in easy-to-reach places.  If you need to reach something above you, use a strong step stool that has a grab bar.  Keep electrical cords out of the way.  Do not use floor polish or wax that makes floors slippery. If you must use wax, use non-skid floor wax.  Do not have throw rugs and other things on the floor that can make you trip. What can I do with my stairs?  Do not leave any items on the stairs.  Make sure that there are handrails on both sides of the stairs and use them. Fix handrails that are broken or loose. Make sure that handrails are as long as the stairways.  Check any carpeting to make sure that it is firmly attached to the stairs. Fix any carpet that is loose or worn.  Avoid having throw rugs at the top or bottom of the stairs. If you do have throw rugs, attach them to the floor with carpet tape.  Make sure that you have a light switch at the top of the stairs and the bottom of the stairs. If you do not have them, ask someone to add them for you. What else can I do to help prevent falls?  Wear shoes that:  Do not have high heels.  Have rubber bottoms.  Are comfortable and fit you well.  Are closed at the toe. Do not wear sandals.  If you use a stepladder:  Make sure that it is fully opened. Do not climb a closed stepladder.  Make sure that both sides of the stepladder are locked into place.  Ask someone to hold it for you, if possible.  Clearly mark and  make sure that you can see:  Any grab bars or handrails.  First and last steps.  Where the edge of each step is.  Use tools that help you move around (mobility aids) if they are needed. These include:  Canes.  Walkers.  Scooters.  Crutches.  Turn on the lights when you go into a dark area. Replace any light bulbs as soon as they burn out.  Set up your furniture so you have a clear path. Avoid moving your furniture around.  If any of your floors are uneven, fix them.  If there are any pets around you, be aware of where they are.  Review your medicines with your doctor. Some medicines can make you feel dizzy. This can increase your chance of falling. Ask your doctor what other things that you can do to help prevent falls. This information is not intended to replace advice given to you by your health care provider. Make sure you discuss any questions you have with your health care provider. Document Released: 06/25/2009 Document Revised: 02/04/2016 Document Reviewed: 10/03/2014 Elsevier Interactive Patient Education  2017 [employer].

## 2020-08-24 NOTE — Telephone Encounter (Signed)
Ms. zorianna, taliaferro are scheduled for a virtual visit with your provider today.    Just as we do with appointments in the office, we must obtain your consent to participate.  Your consent will be active for this visit and any virtual visit you may have with one of our providers in the next 365 days.    If you have a MyChart account, I can also send a copy of this consent to you electronically.  All virtual visits are billed to your insurance company just like a traditional visit in the office.  As this is a virtual visit, video technology does not allow for your provider to perform a traditional examination.  This may limit your provider's ability to fully assess your condition.  If your provider identifies any concerns that need to be evaluated in person or the need to arrange testing such as labs, EKG, etc, we will make arrangements to do so.    Although advances in technology are sophisticated, we cannot ensure that it will always work on either your end or our end.  If the connection with a video visit is poor, we may have to switch to a telephone visit.  With either a video or telephone visit, we are not always able to ensure that we have a secure connection.   I need to obtain your verbal consent now.   Are you willing to proceed with your visit today?   Milicent Acheampong has provided verbal consent on 08/24/2020 for a virtual visit (video or telephone).   Leigh Aurora [place], [place] 08/24/2020  10:46 AM

## 2020-08-24 NOTE — Progress Notes (Signed)
Subjective:   Brooke Cox is a 79 y.o. female who presents for Medicare Annual (Subsequent) preventive examination.  Review of Systems     Cardiac Risk Factors include: advanced age (>61mn, >>46women);obesity (BMI >30kg/m2);hypertension;dyslipidemia     Objective:    There were no vitals filed for this visit. There is no height or weight on file to calculate BMI.  Advanced Directives 08/24/2020 05/07/2020 04/17/2020 02/21/2020 12/04/2019 08/23/2019 02/12/2019  Does Patient Have a Medical Advance Directive? Yes No Yes Yes Yes Yes Yes  Type of Advance Directive Out of facility DNR (pink MOST or yellow form) - Out of facility DNR (pink MOST or yellow form) Out of facility DNR (pink MOST or yellow form) Out of facility DNR (pink MOST or yellow form) Out of facility DNR (pink MOST or yellow form) Out of facility DNR (pink MOST or yellow form)  Does patient want to make changes to medical advance directive? No - Patient declined No - Patient declined No - Patient declined No - Patient declined No - Patient declined No - Patient declined No - Patient declined  Copy of [place] Chart? - - - Yes - validated most recent copy scanned in chart (See row information) - - -  Would patient like information on creating a medical advance directive? - - - - - - -  Pre-existing out of facility DNR order (yellow form or pink MOST form) - - Pink MOST/Yellow Form most recent copy in chart - Physician notified to receive inpatient order Yellow form placed in chart (order not valid for inpatient use);Pink MOST form placed in chart (order not valid for inpatient use) Yellow form placed in chart (order not valid for inpatient use) - Yellow form placed in chart (order not valid for inpatient use)    Current Medications (verified) Outpatient Encounter Medications as of 08/24/2020  Medication Sig   acetaminophen (TYLENOL) 650 MG CR tablet Take 650 mg by mouth as needed.    aspirin EC 81 MG  tablet Take 81 mg by mouth daily.   atenolol (TENORMIN) 25 MG tablet TAKE 1 TABLET BY MOUTH EVERYDAY AT BEDTIME   Biotin 5000 MCG CAPS Take by mouth daily.   Cholecalciferol (VITAMIN D) 2000 UNITS tablet Take 2,000 Units by mouth daily.    pantoprazole (PROTONIX) 40 MG tablet TAKE 1 TABLET BY MOUTH EVERY DAY   simvastatin (ZOCOR) 40 MG tablet TAKE 1 TABLET BY MOUTH DAILY AT 6:00PM   [DISCONTINUED] predniSONE (STERAPRED UNI-PAK 21 TAB) 10 MG (21) TBPK tablet Take as directed (Patient not taking: Reported on 08/24/2020)   No facility-administered encounter medications on file as of 08/24/2020.    Allergies (verified) Shellfish allergy   History: Past Medical History:  Diagnosis Date   Disorder of bone and cartilage, unspecified    Disorders of bursae and tendons in shoulder region, unspecified    Encounter for long-term (current) use of other medications    Esophageal reflux    Hyperosmolality and/or hypernatremia    Osteoporosis, unspecified    Other and unspecified hyperlipidemia    Pain in joint, shoulder region    Pain in limb    Palpitations    Pure hypercholesterolemia    Reflux esophagitis    Tachycardia, unspecified    Unspecified essential hypertension    Unspecified urinary incontinence    Past Surgical History:  Procedure Laterality Date   APPENDECTOMY  1955   Bone Density  08/14/2012   CATARACT EXTRACTION Bilateral 2010  CHOLECYSTECTOMY  2004   COLONOSCOPY  03/31/2009   Diverticulosis, Dr.John Amedeo Plenty    DIAGNOSTIC MAMMOGRAM     LAPAROSCOPY N/A 02/15/2018   Procedure: LAPAROSCOPY DIAGNOSTIC, LYSIS OF ADHESION;  Surgeon: Ralene Ok, MD;  Location: [facility];  Service: General;  Laterality: N/A;   Family History  Problem Relation Age of Onset   Hypertension Mother    Diabetes Brother    Crohn's disease Neg Hx    Inflammatory bowel disease Neg Hx    Social History   Socioeconomic History   Marital status: Married    Spouse  name: Not on file   Number of children: Not on file   Years of education: Not on file   Highest education level: Not on file  Occupational History   Occupation: retired  Tobacco Use   Smoking status: Never Smoker   Smokeless tobacco: Never Used  Scientific laboratory technician Use: Never used  Substance and Sexual Activity   Alcohol use: No   Drug use: No   Sexual activity: Not Currently  Other Topics Concern   Not on file  Social History Narrative   Not on file   Social Determinants of Health   Financial Resource Strain: Not on file  Food Insecurity: Not on file  Transportation Needs: Not on file  Physical Activity: Not on file  Stress: Not on file  Social Connections: Not on file    Tobacco Counseling Counseling given: Not Answered   Clinical Intake:     Pain : No/denies pain     BMI - recorded: 32 Nutritional Status: BMI > 30  Obese Nutritional Risks: None Diabetes: No  How often do you need to have someone help you when you read instructions, pamphlets, or other written materials from your doctor or pharmacy?: 1 - Never  Diabetic?no         Activities of Daily Living In your present state of health, do you have any difficulty performing the following activities: 08/24/2020  Hearing? N  Vision? N  Difficulty concentrating or making decisions? N  Walking or climbing stairs? N  Dressing or bathing? N  Doing errands, shopping? N  Preparing Food and eating ? N  Using the Toilet? N  In the past six months, have you accidently leaked urine? Y  Do you have problems with loss of bowel control? N  Managing your Medications? N  Managing your Finances? N  Housekeeping or managing your Housekeeping? N  Some recent data might be hidden    Patient Care Team: [name], NP as PCP - General (Geriatric Medicine) Renard Hamper Marguerita Merles (Physician Assistant) Clent Jacks, MD as Consulting Physician (Ophthalmology) Rozetta Nunnery, MD as  Consulting Physician (Otolaryngology)  Indicate any recent Medical Services you may have received from other than [facility] providers in the past year (date may be approximate).     Assessment:   This is a routine wellness examination for Brooke Cox.  Hearing/Vision screen  Hearing Screening   125Hz  250Hz  500Hz  1000Hz  2000Hz  3000Hz  4000Hz  6000Hz  8000Hz   Right ear:           Left ear:           Comments: No hearing issues  Vision Screening Comments: Last eye exam greater than 12 months ago. Patient will check records and schedule if needed.   Dietary issues and exercise activities discussed: Current Exercise Habits: Home exercise routine, Type of exercise: walking, Time (Minutes): 30, Frequency (Times/Week): 3, Weekly Exercise (Minutes/Week): 90  Goals     DIET - INCREASE WATER INTAKE     Increase water to 4-5 cups a day.     Increase water intake     Starting 08/01/16, I will increase my water intake from 3-4 glasses per day to 8 glasses per day.       Depression Screen PHQ 2/9 Scores 08/24/2020 08/23/2019 02/12/2019 08/20/2018 08/07/2017 08/07/2017 01/30/2017  PHQ - 2 Score 0 0 0 0 0 0 0    Fall Risk Fall Risk  08/24/2020 04/17/2020 02/21/2020 11/28/2019 08/23/2019  Falls in the past year? 0 0 0 0 0  Number falls in past yr: 0 0 0 0 0  Injury with Fall? 0 0 0 0 0    FALL RISK PREVENTION PERTAINING TO THE HOME:  Any stairs in or around the home? No  If so, are there any without handrails? No  Home free of loose throw rugs in walkways, pet beds, electrical cords, etc? Yes  Adequate lighting in your home to reduce risk of falls? Yes   ASSISTIVE DEVICES UTILIZED TO PREVENT FALLS:  Life alert? No  Use of a cane, walker or w/c? No  Grab bars in the bathroom? Yes  Shower chair or bench in shower? No  Elevated toilet seat or a handicapped toilet? No   TIMED UP AND GO:  Was the test performed? No .    Cognitive Function: MMSE - Mini Mental State Exam 08/20/2018 08/07/2017  08/01/2016 07/30/2015 07/23/2014  Orientation to time 5 5 5 5 5   Orientation to Place 5 5 5 5 5   Registration 3 3 3 3 3   Attention/ Calculation 5 5 5 5 5   Recall 3 2 3 3 3   Language- name 2 objects 2 2 2 2 2   Language- repeat 1 1 1 1 1   Language- follow 3 step command 3 3 3 3 3   Language- read & follow direction 1 1 1 1 1   Write a sentence 1 1 1 1 1   Copy design 1 1 1 1 1   Total score 30 29 30 30 30      6CIT Screen 08/24/2020 08/23/2019  What Year? 0 points 0 points  What month? 0 points 0 points  What time? 0 points 0 points  Count back from 20 0 points 0 points  Months in reverse 0 points 0 points  Repeat phrase 0 points 2 points  Total Score 0 2    Immunizations Immunization History  Administered Date(s) Administered   Fluad Quad(high Dose 65+) 07/09/2019, 06/10/2020   Influenza, High Dose Seasonal PF 07/12/2017, 06/22/2018   Influenza,inj,Quad PF,6+ Mos 07/03/2013, 07/30/2015, 08/01/2016   Influenza-Unspecified 07/03/2012, 06/12/2014   PFIZER SARS-COV-2 Vaccination 10/02/2019, 10/20/2019, 08/12/2020   Pneumococcal Conjugate-13 07/23/2014   Pneumococcal Polysaccharide-23 02/20/2008   Tdap 07/05/2011   Zoster 07/28/2011   Zoster Recombinat (Shingrix) 09/06/2018, 03/27/2019    TDAP status: Up to date  Flu Vaccine status: Up to date  Pneumococcal vaccine status: Up to date  Covid-19 vaccine status: Completed vaccines  Qualifies for Shingles Vaccine? Yes   Zostavax completed Yes   Shingrix Completed?: Yes  Screening Tests Health Maintenance  Topic Date Due   MAMMOGRAM  05/26/2021   TETANUS/TDAP  07/04/2021   INFLUENZA VACCINE  Completed   DEXA SCAN  Completed   COVID-19 Vaccine  Completed   PNA vac Low Risk Adult  Completed    Health Maintenance  There are no preventive care reminders to display for this patient.  Colorectal cancer  screening: No longer required.   Mammogram status: Completed 05/26/2020. Repeat every year  Bone  Density status: Completed 04/2019. Results reflect: Bone density results: OSTEOPENIA. Repeat every 2 years.  Lung Cancer Screening: (Low Dose CT Chest recommended if Age 18-80 years, 30 pack-year currently smoking OR have quit w/in 15years.) does not qualify.   Lung Cancer Screening Referral: na  Additional Screening:  Hepatitis C Screening: does not qualify; Completed na  Vision Screening: Recommended annual ophthalmology exams for early detection of glaucoma and other disorders of the eye. Is the patient up to date with their annual eye exam?  No  Who is the provider or what is the name of the office in which the patient attends annual eye exams? Dr Katy Fitch If pt is not established with a provider, would they like to be referred to a provider to establish care? Yes .   Dental Screening: Recommended annual dental exams for proper oral hygiene  Community Resource Referral / Chronic Care Management: CRR required this visit?  No   CCM required this visit?  No      Plan:     I have personally reviewed and noted the following in the patients chart:    Medical and social history  Use of alcohol, tobacco or illicit drugs   Current medications and supplements  Functional ability and status  Nutritional status  Physical activity  Advanced directives  List of other physicians  Hospitalizations, surgeries, and ER visits in previous 12 months  Vitals  Screenings to include cognitive, depression, and falls  Referrals and appointments  In addition, I have reviewed and discussed with patient certain preventive protocols, quality metrics, and best practice recommendations. A written personalized care plan for preventive services as well as general preventive health recommendations were provided to patient.     [name], NP   08/24/2020    Virtual Visit via Telephone Note  I connected with@ on 08/24/20 at 11:00 AM EST by telephone and verified that I am speaking  with the correct person using two identifiers.  Location: Patient: home Provider: [facility]   I discussed the limitations, risks, security and privacy concerns of performing an evaluation and management service by telephone and the availability of in person appointments. I also discussed with the patient that there may be a patient responsible charge related to this service. The patient expressed understanding and agreed to proceed.   I discussed the assessment and treatment plan with the patient. The patient was provided an opportunity to ask questions and all were answered. The patient agreed with the plan and demonstrated an understanding of the instructions.   The patient was advised to call back or seek an in-person evaluation if the symptoms worsen or if the condition fails to improve as anticipated.  I provided 18 minutes of non-face-to-face time during this encounter.  Carlos American. Harle Battiest [employer] printed and mailed

## 2020-08-26 ENCOUNTER — Other Ambulatory Visit: Payer: Self-pay

## 2020-08-26 ENCOUNTER — Encounter: Payer: Self-pay | Admitting: Nurse Practitioner

## 2020-08-26 ENCOUNTER — Ambulatory Visit (INDEPENDENT_AMBULATORY_CARE_PROVIDER_SITE_OTHER): Payer: Medicare Other | Admitting: Nurse Practitioner

## 2020-08-26 VITALS — BP 120/80 | HR 63 | Temp 97.1°F | Ht <= 58 in | Wt 152.6 lb

## 2020-08-26 DIAGNOSIS — E785 Hyperlipidemia, unspecified: Secondary | ICD-10-CM | POA: Diagnosis not present

## 2020-08-26 DIAGNOSIS — H6123 Impacted cerumen, bilateral: Secondary | ICD-10-CM

## 2020-08-26 DIAGNOSIS — M8580 Other specified disorders of bone density and structure, unspecified site: Secondary | ICD-10-CM | POA: Diagnosis not present

## 2020-08-26 DIAGNOSIS — K219 Gastro-esophageal reflux disease without esophagitis: Secondary | ICD-10-CM

## 2020-08-26 DIAGNOSIS — I119 Hypertensive heart disease without heart failure: Secondary | ICD-10-CM

## 2020-08-26 DIAGNOSIS — D509 Iron deficiency anemia, unspecified: Secondary | ICD-10-CM | POA: Diagnosis not present

## 2020-08-26 NOTE — Progress Notes (Signed)
Careteam: Patient Care Team: [name], NP as PCP - General (Geriatric Medicine) Renard Hamper Marguerita Merles (Physician Assistant) Clent Jacks, MD as Consulting Physician (Ophthalmology) Rozetta Nunnery, MD as Consulting Physician (Otolaryngology)  PLACE OF SERVICE:  [place] Directive information Does Patient Have a Medical Advance Directive?: Yes, Type of Advance Directive: Out of facility DNR (pink MOST or yellow form), Pre-existing out of facility DNR order (yellow form or pink MOST form): Yellow form placed in chart (order not valid for inpatient use);Pink MOST form placed in chart (order not valid for inpatient use), Does patient want to make changes to medical advance directive?: No - Patient declined  Allergies  Allergen Reactions  . Shellfish Allergy Nausea And Vomiting    Chief Complaint  Patient presents with  . Medical Management of Chronic Issues    6 month follow up visit.     HPI: Patient is a 79 y.o. female for routine follow up  GERD- reports "little better" on protonix. Had to change diet a lot. No red sauce or fried foods.  Hyperlipidemia- heart healthy diet, con tines on zocor, LDL 105  Hypertension/tachycarida- no palpitations or chest pains. Continues atenolol 25 mg at bedtime.   Osteopenia- continues on vit d   Elevated cal level- was taken off calcium will use tums every 4-5 days due to increase in GERD  Review of Systems:  Review of Systems  Constitutional: Negative for chills, fever and weight loss.  HENT: Negative for tinnitus.   Respiratory: Negative for cough, sputum production and shortness of breath.   Cardiovascular: Negative for chest pain, palpitations and leg swelling.  Gastrointestinal: Positive for heartburn. Negative for abdominal pain, constipation and diarrhea.  Genitourinary: Negative for dysuria, frequency and urgency.  Musculoskeletal: Negative for back pain, falls, joint pain and myalgias.  Skin:  Negative.   Neurological: Negative for dizziness and headaches.  Psychiatric/Behavioral: Negative for depression and memory loss. The patient does not have insomnia.     Past Medical History:  Diagnosis Date  . Disorder of bone and cartilage, unspecified   . Disorders of bursae and tendons in shoulder region, unspecified   . Encounter for long-term (current) use of other medications   . Esophageal reflux   . Hyperosmolality and/or hypernatremia   . Osteoporosis, unspecified   . Other and unspecified hyperlipidemia   . Pain in joint, shoulder region   . Pain in limb   . Palpitations   . Pure hypercholesterolemia   . Reflux esophagitis   . Tachycardia, unspecified   . Unspecified essential hypertension   . Unspecified urinary incontinence    Past Surgical History:  Procedure Laterality Date  . APPENDECTOMY  1955  . Bone Density  08/14/2012  . CATARACT EXTRACTION Bilateral 2010  . CHOLECYSTECTOMY  2004  . COLONOSCOPY  03/31/2009   Diverticulosis, Dr.John Amedeo Plenty   . DIAGNOSTIC MAMMOGRAM    . LAPAROSCOPY N/A 02/15/2018   Procedure: LAPAROSCOPY DIAGNOSTIC, LYSIS OF ADHESION;  Surgeon: Ralene Ok, MD;  Location: [facility];  Service: General;  Laterality: N/A;   Social History:   reports that she has never smoked. She has never used smokeless tobacco. She reports that she does not drink alcohol and does not use drugs.  Family History  Problem Relation Age of Onset  . Hypertension Mother   . Diabetes Brother   . Crohn's disease Neg Hx   . Inflammatory bowel disease Neg Hx     Medications: Patient's Medications  New Prescriptions  No medications on file  Previous Medications   ACETAMINOPHEN (TYLENOL) 650 MG CR TABLET    Take 650 mg by mouth as needed.    ASPIRIN EC 81 MG TABLET    Take 81 mg by mouth daily.   ATENOLOL (TENORMIN) 25 MG TABLET    TAKE 1 TABLET BY MOUTH EVERYDAY AT BEDTIME   BIOTIN 5000 MCG CAPS    Take by mouth daily.   CHOLECALCIFEROL (VITAMIN D) 2000  UNITS TABLET    Take 2,000 Units by mouth daily.    PANTOPRAZOLE (PROTONIX) 40 MG TABLET    TAKE 1 TABLET BY MOUTH EVERY DAY   SIMVASTATIN (ZOCOR) 40 MG TABLET    TAKE 1 TABLET BY MOUTH DAILY AT 6:00PM  Modified Medications   No medications on file  Discontinued Medications   No medications on file    Physical Exam:  Vitals:   08/26/20 0824  BP: 120/80  Pulse: 63  Temp: (!) 97.1 F (36.2 C)  TempSrc: Temporal  SpO2: 98%  Weight: 152 lb 9.6 oz (69.2 kg)  Height: 4' 10"  (1.473 m)   Body mass index is 31.89 kg/m. Wt Readings from Last 3 Encounters:  08/26/20 152 lb 9.6 oz (69.2 kg)  04/17/20 156 lb 12.8 oz (71.1 kg)  02/21/20 157 lb (71.2 kg)    Physical Exam Constitutional:      General: She is not in acute distress.    Appearance: She is well-developed and well-nourished. She is not diaphoretic.  HENT:     Head: Normocephalic and atraumatic.     Right Ear: There is impacted cerumen.     Left Ear: There is impacted cerumen.     Mouth/Throat:     Mouth: Oropharynx is clear and moist.     Pharynx: No oropharyngeal exudate.  Eyes:     Conjunctiva/sclera: Conjunctivae normal.     Pupils: Pupils are equal, round, and reactive to light.  Cardiovascular:     Rate and Rhythm: Normal rate and regular rhythm.     Heart sounds: Normal heart sounds.  Pulmonary:     Effort: Pulmonary effort is normal.     Breath sounds: Normal breath sounds.  Abdominal:     General: Bowel sounds are normal.     Palpations: Abdomen is soft.  Musculoskeletal:        General: No tenderness or edema.     Cervical back: Normal range of motion and neck supple.  Skin:    General: Skin is warm and dry.  Neurological:     Mental Status: She is alert and oriented to person, place, and time.  Psychiatric:        Mood and Affect: Mood and affect normal.     Labs reviewed: Basic Metabolic Panel: Recent Labs    02/21/20 0953  NA 141  K 4.4  CL 106  CO2 28  GLUCOSE 83  BUN 15  CREATININE  0.71  CALCIUM 10.5*   Liver Function Tests: Recent Labs    02/21/20 0953  AST 15  ALT 9  BILITOT 0.5  PROT 6.4   No results for input(s): LIPASE, AMYLASE in the last 8760 hours. No results for input(s): AMMONIA in the last 8760 hours. CBC: Recent Labs    02/21/20 0953  WBC 5.7  NEUTROABS 3,591  HGB 11.8  HCT 35.4  MCV 91.7  PLT 275   Lipid Panel: Recent Labs    02/21/20 0953  CHOL 189  HDL 61  LDLCALC 105*  TRIG 134  CHOLHDL 3.1   TSH: No results for input(s): TSH in the last 8760 hours. A1C: No results found for: HGBA1C   Assessment/Plan 1. Osteopenia, unspecified location -continues on vit d with weight bearing exercises.   2. Gastroesophageal reflux disease without esophagitis -continues on protonix 40 mg daily, occasional breakthrough symptoms based on diet. She cont dietary and lifestyle modifications.   3. Hyperlipidemia with target LDL less than 130 -dietary modifications encouraged, continues on zocor.  - COMPLETE METABOLIC PANEL WITH GFR  4. Hypercalcemia - COMPLETE METABOLIC PANEL WITH GFR  5. Benign hypertensive heart disease without heart failure -stable on atenolol 25 mg daily - COMPLETE METABOLIC PANEL WITH GFR - CBC with Differential/Platelet  6. Bilateral impacted cerumen -removed via flush and grabber. Pt tolerated well.   Next appt: 6 months Wylene Weissman K. Cameron, [facility] [phone]

## 2020-08-26 NOTE — Patient Instructions (Signed)
Heartburn Heartburn is a type of pain or discomfort that can happen in the throat or chest. It is often described as a burning pain. It may also cause a bad, acid-like taste in the mouth. Heartburn may feel worse when you lie down or bend over, and it is often worse at night. Heartburn may be caused by stomach contents that move back up into the esophagus (reflux). Follow these instructions at home: Eating and drinking   Avoid certain foods and drinks as told by your health care provider. This may include: ? Coffee and tea (with or without caffeine). ? Drinks that contain alcohol. ? Energy drinks and sports drinks. ? Carbonated drinks or sodas. ? Chocolate and cocoa. ? Peppermint and mint flavorings. ? Garlic and onions. ? Horseradish. ? Spicy and acidic foods, including peppers, chili powder, curry powder, vinegar, hot sauces, and barbecue sauce. ? Citrus fruit juices and citrus fruits, such as oranges, lemons, and limes. ? Tomato-based foods, such as red sauce, chili, salsa, and pizza with red sauce. ? Fried and fatty foods, such as donuts, french fries, potato chips, and high-fat dressings. ? High-fat meats, such as hot dogs and fatty cuts of red and white meats, such as rib eye steak, sausage, ham, and bacon. ? High-fat dairy items, such as whole milk, butter, and cream cheese.  Eat small, frequent meals instead of large meals.  Avoid drinking large amounts of liquid with your meals.  Avoid eating meals during the 2-3 hours before bedtime.  Avoid lying down right after you eat.  Do not exercise right after you eat. Lifestyle      If you are overweight, reduce your weight to an amount that is healthy for you. Ask your health care provider for guidance about a safe weight loss goal.  Do not use any products that contain nicotine or tobacco, such as cigarettes, e-cigarettes, and chewing tobacco. These can make your symptoms worse. If you need help quitting, ask your  health care provider.  Wear loose-fitting clothing. Do not wear anything tight around your waist that causes pressure on your abdomen.  Raise (elevate) the head of your bed about 6 inches (15 cm) when you sleep.  Try to reduce your stress, such as with yoga or meditation. If you need help reducing stress, ask your health care provider. General instructions  Pay attention to any changes in your symptoms.  Take over-the-counter and prescription medicines only as told by your health care provider. ? Do not take aspirin, ibuprofen, or other NSAIDs unless your health care provider told you to do so. ? Stop medicines only as told by your health care provider. If you stop taking some medicines too quickly, your symptoms may get worse.  Keep all follow-up visits as told by your health care provider. This is important. Contact a health care provider if:  You have new symptoms.  You have unexplained weight loss.  You have difficulty swallowing, or it hurts to swallow.  You have wheezing or a persistent cough.  Your symptoms do not improve with treatment.  You have frequent heartburn for more than 2 weeks. Get help right away if:  You have pain in your arms, neck, jaw, teeth, or back.  You feel sweaty, dizzy, or light-headed.  You have chest pain or shortness of breath.  You vomit and your vomit looks like blood or coffee grounds.  Your stool is bloody or black. These symptoms may represent a serious problem that is an  emergency. Do not wait to see if the symptoms will go away. Get medical help right away. Call your local emergency services (911 in the [place] Do not drive yourself to the hospital. Summary  Heartburn is a type of pain or discomfort that can happen in the throat or chest. It is often described as a burning pain. It may also cause a bad, acid-like taste in the mouth.  Avoid certain foods and drinks as told by your health care provider.  Take over-the-counter and  prescription medicines only as told by your health care provider. Do not take aspirin, ibuprofen, or other NSAIDs unless your health care provider told you to do so.  Contact a health care provider if your symptoms do not improve or they get worse. This information is not intended to replace advice given to you by your health care provider. Make sure you discuss any questions you have with your health care provider. Document Revised: 01/29/2018 Document Reviewed: 01/29/2018 Elsevier Patient Education  [place].

## 2020-08-28 ENCOUNTER — Other Ambulatory Visit: Payer: Self-pay

## 2020-08-28 DIAGNOSIS — D509 Iron deficiency anemia, unspecified: Secondary | ICD-10-CM

## 2020-08-28 LAB — CBC WITH DIFFERENTIAL/PLATELET
Absolute Monocytes: 564 cells/uL (ref 200–950)
Basophils Absolute: 37 cells/uL (ref 0–200)
Basophils Relative: 0.6 %
Eosinophils Absolute: 211 cells/uL (ref 15–500)
Eosinophils Relative: 3.4 %
HCT: 31.4 % — ABNORMAL LOW (ref 35.0–45.0)
Hemoglobin: 10.7 g/dL — ABNORMAL LOW (ref 11.7–15.5)
Lymphs Abs: 1575 cells/uL (ref 850–3900)
MCH: 29.7 pg (ref 27.0–33.0)
MCHC: 34.1 g/dL (ref 32.0–36.0)
MCV: 87.2 fL (ref 80.0–100.0)
MPV: 9.4 fL (ref 7.5–12.5)
Monocytes Relative: 9.1 %
Neutro Abs: 3813 cells/uL (ref 1500–7800)
Neutrophils Relative %: 61.5 %
Platelets: 318 10*3/uL (ref 140–400)
RBC: 3.6 10*6/uL — ABNORMAL LOW (ref 3.80–5.10)
RDW: 12.2 % (ref 11.0–15.0)
Total Lymphocyte: 25.4 %
WBC: 6.2 10*3/uL (ref 3.8–10.8)

## 2020-08-28 LAB — COMPLETE METABOLIC PANEL WITH GFR
AG Ratio: 1.9 (calc) (ref 1.0–2.5)
ALT: 10 U/L (ref 6–29)
AST: 14 U/L (ref 10–35)
Albumin: 4.1 g/dL (ref 3.6–5.1)
Alkaline phosphatase (APISO): 65 U/L (ref 37–153)
BUN: 17 mg/dL (ref 7–25)
CO2: 30 mmol/L (ref 20–32)
Calcium: 10.4 mg/dL (ref 8.6–10.4)
Chloride: 105 mmol/L (ref 98–110)
Creat: 0.68 mg/dL (ref 0.60–0.88)
GFR, Est African American: 95 mL/min/{1.73_m2} (ref >=60)
GFR, Est Non African American: 82 mL/min/{1.73_m2} (ref >=60)
Globulin: 2.2 g/dL (calc) (ref 1.9–3.7)
Glucose, Bld: 78 mg/dL (ref 65–99)
Potassium: 4.4 mmol/L (ref 3.5–5.3)
Sodium: 140 mmol/L (ref 135–146)
Total Bilirubin: 0.5 mg/dL (ref 0.2–1.2)
Total Protein: 6.3 g/dL (ref 6.1–8.1)

## 2020-08-28 LAB — IRON,TIBC AND FERRITIN PANEL
%SAT: 8 % (calc) — ABNORMAL LOW (ref 16–45)
Ferritin: 5 ng/mL — ABNORMAL LOW (ref 16–288)
Iron: 40 ug/dL — ABNORMAL LOW (ref 45–160)
TIBC: 473 mcg/dL (calc) — ABNORMAL HIGH (ref 250–450)

## 2020-08-28 LAB — TEST AUTHORIZATION

## 2020-09-07 ENCOUNTER — Other Ambulatory Visit: Payer: Self-pay | Admitting: Nurse Practitioner

## 2020-09-07 DIAGNOSIS — D509 Iron deficiency anemia, unspecified: Secondary | ICD-10-CM | POA: Diagnosis not present

## 2020-09-08 LAB — FECAL GLOBIN BY IMMUNOCHEMISTRY
FECAL GLOBIN RESULT:: NOT DETECTED
MICRO NUMBER:: 11358249
SPECIMEN QUALITY:: ADEQUATE

## 2020-10-07 ENCOUNTER — Other Ambulatory Visit: Payer: Self-pay | Admitting: Nurse Practitioner

## 2020-10-07 DIAGNOSIS — I119 Hypertensive heart disease without heart failure: Secondary | ICD-10-CM

## 2020-10-07 DIAGNOSIS — K219 Gastro-esophageal reflux disease without esophagitis: Secondary | ICD-10-CM

## 2021-02-24 ENCOUNTER — Other Ambulatory Visit: Payer: Self-pay

## 2021-02-24 ENCOUNTER — Ambulatory Visit (INDEPENDENT_AMBULATORY_CARE_PROVIDER_SITE_OTHER): Payer: Medicare Other | Admitting: Nurse Practitioner

## 2021-02-24 ENCOUNTER — Encounter: Payer: Self-pay | Admitting: Nurse Practitioner

## 2021-02-24 VITALS — BP 118/80 | HR 57 | Temp 97.0°F | Ht <= 58 in | Wt 150.0 lb

## 2021-02-24 DIAGNOSIS — M8580 Other specified disorders of bone density and structure, unspecified site: Secondary | ICD-10-CM

## 2021-02-24 DIAGNOSIS — I83812 Varicose veins of left lower extremities with pain: Secondary | ICD-10-CM | POA: Diagnosis not present

## 2021-02-24 DIAGNOSIS — D509 Iron deficiency anemia, unspecified: Secondary | ICD-10-CM

## 2021-02-24 DIAGNOSIS — K219 Gastro-esophageal reflux disease without esophagitis: Secondary | ICD-10-CM | POA: Diagnosis not present

## 2021-02-24 DIAGNOSIS — E785 Hyperlipidemia, unspecified: Secondary | ICD-10-CM

## 2021-02-24 DIAGNOSIS — I119 Hypertensive heart disease without heart failure: Secondary | ICD-10-CM

## 2021-02-24 LAB — CBC WITH DIFFERENTIAL/PLATELET
Absolute Monocytes: 395 cells/uL (ref 200–950)
Basophils Absolute: 21 cells/uL (ref 0–200)
Basophils Relative: 0.4 %
Eosinophils Absolute: 187 cells/uL (ref 15–500)
Eosinophils Relative: 3.6 %
HCT: 41.5 % (ref 35.0–45.0)
Hemoglobin: 13.6 g/dL (ref 11.7–15.5)
Lymphs Abs: 1357 cells/uL (ref 850–3900)
MCH: 30.9 pg (ref 27.0–33.0)
MCHC: 32.8 g/dL (ref 32.0–36.0)
MCV: 94.3 fL (ref 80.0–100.0)
MPV: 9.5 fL (ref 7.5–12.5)
Monocytes Relative: 7.6 %
Neutro Abs: 3240 cells/uL (ref 1500–7800)
Neutrophils Relative %: 62.3 %
Platelets: 269 10*3/uL (ref 140–400)
RBC: 4.4 10*6/uL (ref 3.80–5.10)
RDW: 12.1 % (ref 11.0–15.0)
Total Lymphocyte: 26.1 %
WBC: 5.2 10*3/uL (ref 3.8–10.8)

## 2021-02-24 LAB — LIPID PANEL
Cholesterol: 205 mg/dL — ABNORMAL HIGH (ref <200)
HDL: 61 mg/dL (ref >=50)
LDL Cholesterol (Calc): 119 mg/dL (calc) — ABNORMAL HIGH
Non-HDL Cholesterol (Calc): 144 mg/dL (calc) — ABNORMAL HIGH (ref <130)
Total CHOL/HDL Ratio: 3.4 (calc) (ref <5.0)
Triglycerides: 131 mg/dL (ref <150)

## 2021-02-24 LAB — COMPLETE METABOLIC PANEL WITH GFR
AG Ratio: 1.9 (calc) (ref 1.0–2.5)
ALT: 10 U/L (ref 6–29)
AST: 16 U/L (ref 10–35)
Albumin: 4.3 g/dL (ref 3.6–5.1)
Alkaline phosphatase (APISO): 68 U/L (ref 37–153)
BUN: 19 mg/dL (ref 7–25)
CO2: 27 mmol/L (ref 20–32)
Calcium: 10.6 mg/dL — ABNORMAL HIGH (ref 8.6–10.4)
Chloride: 104 mmol/L (ref 98–110)
Creat: 0.71 mg/dL (ref 0.60–0.88)
GFR, Est African American: 92 mL/min/{1.73_m2} (ref >=60)
GFR, Est Non African American: 79 mL/min/{1.73_m2} (ref >=60)
Globulin: 2.3 g/dL (calc) (ref 1.9–3.7)
Glucose, Bld: 81 mg/dL (ref 65–99)
Potassium: 4.6 mmol/L (ref 3.5–5.3)
Sodium: 139 mmol/L (ref 135–146)
Total Bilirubin: 0.7 mg/dL (ref 0.2–1.2)
Total Protein: 6.6 g/dL (ref 6.1–8.1)

## 2021-02-24 NOTE — Progress Notes (Signed)
Careteam: Patient Care Team: [name], NP as PCP - General (Geriatric Medicine) Renard Hamper Marguerita Merles (Physician Assistant) Clent Jacks, MD as Consulting Physician (Ophthalmology) Rozetta Nunnery, MD as Consulting Physician (Otolaryngology)  PLACE OF SERVICE:  [place] Directive information Does Patient Have a Medical Advance Directive?: Yes, Type of Advance Directive: Out of facility DNR (pink MOST or yellow form), Pre-existing out of facility DNR order (yellow form or pink MOST form): Yellow form placed in chart (order not valid for inpatient use), Does patient want to make changes to medical advance directive?: No - Patient declined  Allergies  Allergen Reactions   Shellfish Allergy Nausea And Vomiting    Chief Complaint  Patient presents with   Medical Management of Chronic Issues    6 month follow up. Concerns about varicose vein left leg.     HPI: Patient is a 80 y.o. female for routine follow up  Has varicosities in her legs that are causing increase in pain has been ongoing and gradually getting worse. Wondering what her options are to treat this.   Has been taking iron supplement due to anemia- she states it makes her constipation. Wants to come off if able.   GERD- ongoing but better, has modified diet.  Htn- controlled on atenolol  Hyperlipidemia- due for labs on zocor.   Review of Systems:  Review of Systems  Constitutional:  Negative for chills, fever and weight loss.  HENT:  Negative for tinnitus.   Respiratory:  Negative for cough, sputum production and shortness of breath.   Cardiovascular:  Negative for chest pain, palpitations and leg swelling.  Gastrointestinal:  Negative for abdominal pain, constipation, diarrhea and heartburn.  Genitourinary:  Negative for dysuria, frequency and urgency.  Musculoskeletal:  Negative for back pain, falls, joint pain and myalgias.  Skin: Negative.   Neurological:  Negative for  dizziness and headaches.  Psychiatric/Behavioral:  Negative for depression and memory loss.    Past Medical History:  Diagnosis Date   Disorder of bone and cartilage, unspecified    Disorders of bursae and tendons in shoulder region, unspecified    Encounter for long-term (current) use of other medications    Esophageal reflux    Hyperosmolality and/or hypernatremia    Osteoporosis, unspecified    Other and unspecified hyperlipidemia    Pain in joint, shoulder region    Pain in limb    Palpitations    Pure hypercholesterolemia    Reflux esophagitis    Tachycardia, unspecified    Unspecified essential hypertension    Unspecified urinary incontinence    Past Surgical History:  Procedure Laterality Date   APPENDECTOMY  1955   Bone Density  08/14/2012   CATARACT EXTRACTION Bilateral 2010   CHOLECYSTECTOMY  2004   COLONOSCOPY  03/31/2009   Diverticulosis, Dr.John Amedeo Plenty    DIAGNOSTIC MAMMOGRAM     LAPAROSCOPY N/A 02/15/2018   Procedure: LAPAROSCOPY DIAGNOSTIC, LYSIS OF ADHESION;  Surgeon: Ralene Ok, MD;  Location: [facility];  Service: General;  Laterality: N/A;   Social History:   reports that she has never smoked. She has never used smokeless tobacco. She reports that she does not drink alcohol and does not use drugs.  Family History  Problem Relation Age of Onset   Hypertension Mother    Diabetes Brother    Crohn's disease Neg Hx    Inflammatory bowel disease Neg Hx     Medications: Patient's Medications  New Prescriptions   No medications on  file  Previous Medications   ACETAMINOPHEN (TYLENOL) 650 MG CR TABLET    Take 650 mg by mouth as needed.    ASPIRIN EC 81 MG TABLET    Take 81 mg by mouth daily.   ATENOLOL (TENORMIN) 25 MG TABLET    TAKE 1 TABLET BY MOUTH EVERYDAY AT BEDTIME   BIOTIN 5000 MCG CAPS    Take by mouth daily.   CHOLECALCIFEROL (VITAMIN D) 2000 UNITS TABLET    Take 2,000 Units by mouth daily.    FERROUS SULFATE (IRON) 325 (65 FE) MG TABS    Take by  mouth daily.   PANTOPRAZOLE (PROTONIX) 40 MG TABLET    TAKE 1 TABLET BY MOUTH EVERY DAY   SIMVASTATIN (ZOCOR) 40 MG TABLET    TAKE 1 TABLET BY MOUTH DAILY AT 6:00PM  Modified Medications   No medications on file  Discontinued Medications   No medications on file    Physical Exam:  Vitals:   02/24/21 0940  BP: 118/80  Pulse: (!) 57  Temp: (!) 97 F (36.1 C)  TempSrc: Temporal  SpO2: 97%  Weight: 150 lb (68 kg)  Height: 4' 10"  (1.473 m)   Body mass index is 31.35 kg/m. Wt Readings from Last 3 Encounters:  02/24/21 150 lb (68 kg)  08/26/20 152 lb 9.6 oz (69.2 kg)  04/17/20 156 lb 12.8 oz (71.1 kg)    Physical Exam Constitutional:      General: She is not in acute distress.    Appearance: She is well-developed. She is not diaphoretic.  HENT:     Head: Normocephalic and atraumatic.     Right Ear: External ear normal.     Left Ear: External ear normal.     Mouth/Throat:     Mouth: Mucous membranes are moist.     Pharynx: No oropharyngeal exudate.  Eyes:     Conjunctiva/sclera: Conjunctivae normal.     Pupils: Pupils are equal, round, and reactive to light.  Cardiovascular:     Rate and Rhythm: Normal rate and regular rhythm.     Heart sounds: Normal heart sounds.  Pulmonary:     Effort: Pulmonary effort is normal.     Breath sounds: Normal breath sounds.  Abdominal:     General: Bowel sounds are normal.     Palpations: Abdomen is soft.  Musculoskeletal:     Cervical back: Normal range of motion and neck supple.     Right lower leg: No edema.     Left lower leg: No edema.  Skin:    General: Skin is warm and dry.  Neurological:     Mental Status: She is alert and oriented to person, place, and time.  Psychiatric:        Mood and Affect: Mood normal.    Labs reviewed: Basic Metabolic Panel: Recent Labs    08/26/20 1004  NA 140  K 4.4  CL 105  CO2 30  GLUCOSE 78  BUN 17  CREATININE 0.68  CALCIUM 10.4   Liver Function Tests: Recent Labs     08/26/20 1004  AST 14  ALT 10  BILITOT 0.5  PROT 6.3   No results for input(s): LIPASE, AMYLASE in the last 8760 hours. No results for input(s): AMMONIA in the last 8760 hours. CBC: Recent Labs    08/26/20 1004  WBC 6.2  NEUTROABS 3,813  HGB 10.7*  HCT 31.4*  MCV 87.2  PLT 318   Lipid Panel: No results for input(s): CHOL, HDL,  LDLCALC, TRIG, CHOLHDL, LDLDIRECT in the last 8760 hours. TSH: No results for input(s): TSH in the last 8760 hours.  A1C: No results found for: HGBA1C   Assessment/Plan 1. Varicose veins of left lower extremity with pain -painful varicosities noted to left lower leg, getting worse.  - Ambulatory referral to Vascular Surgery  2. Iron deficiency anemia, unspecified iron deficiency anemia type -taking supplements, no signs of blood loss. - CBC with Differential/Platelet  3. Osteopenia, unspecified location - continue vit d supplement with weight bearing exercises.   4. Gastroesophageal reflux disease without esophagitis Ongoing but improved with dietary modifications and protonix 40 mg dialy  5. Hyperlipidemia with target LDL less than 130 -continue on zocor with diet modifications.  - Lipid Panel - COMPLETE METABOLIC PANEL WITH GFR  6. Hypercalcemia Not on calcium supplement due to this. - COMPLETE METABOLIC PANEL WITH GFR  7. Benign hypertensive heart disease without heart failure -stable on current regimen. - CBC with Differential/Platelet - COMPLETE METABOLIC PANEL WITH GFR   Next appt: 6 months, labs prior to visit.  Carlos American. [place], [facility] [phone]

## 2021-02-24 NOTE — Patient Instructions (Signed)
Okay to use tylenol 325 mg 2 tabets at bedtime to help if having pain at night.

## 2021-02-25 ENCOUNTER — Other Ambulatory Visit: Payer: Self-pay | Admitting: Nurse Practitioner

## 2021-02-25 DIAGNOSIS — E785 Hyperlipidemia, unspecified: Secondary | ICD-10-CM

## 2021-02-27 ENCOUNTER — Other Ambulatory Visit: Payer: Self-pay | Admitting: Nurse Practitioner

## 2021-02-27 DIAGNOSIS — I119 Hypertensive heart disease without heart failure: Secondary | ICD-10-CM

## 2021-02-27 DIAGNOSIS — K219 Gastro-esophageal reflux disease without esophagitis: Secondary | ICD-10-CM

## 2021-03-03 ENCOUNTER — Other Ambulatory Visit: Payer: Self-pay

## 2021-06-01 LAB — HM MAMMOGRAPHY

## 2021-06-02 ENCOUNTER — Encounter: Payer: Self-pay | Admitting: *Deleted

## 2021-07-01 ENCOUNTER — Other Ambulatory Visit: Payer: Self-pay

## 2021-07-01 ENCOUNTER — Ambulatory Visit (INDEPENDENT_AMBULATORY_CARE_PROVIDER_SITE_OTHER): Payer: Medicare Other

## 2021-07-01 DIAGNOSIS — Z23 Encounter for immunization: Secondary | ICD-10-CM

## 2021-08-19 ENCOUNTER — Other Ambulatory Visit: Payer: Self-pay | Admitting: Nurse Practitioner

## 2021-08-19 DIAGNOSIS — E785 Hyperlipidemia, unspecified: Secondary | ICD-10-CM

## 2021-08-24 ENCOUNTER — Other Ambulatory Visit: Payer: Medicare Other

## 2021-08-25 ENCOUNTER — Other Ambulatory Visit: Payer: Medicare Other

## 2021-08-25 ENCOUNTER — Other Ambulatory Visit: Payer: Self-pay

## 2021-08-25 DIAGNOSIS — E785 Hyperlipidemia, unspecified: Secondary | ICD-10-CM

## 2021-08-25 DIAGNOSIS — D509 Iron deficiency anemia, unspecified: Secondary | ICD-10-CM

## 2021-08-26 LAB — CBC WITH DIFFERENTIAL/PLATELET
Absolute Monocytes: 437 cells/uL (ref 200–950)
Basophils Absolute: 41 cells/uL (ref 0–200)
Basophils Relative: 0.7 %
Eosinophils Absolute: 112 cells/uL (ref 15–500)
Eosinophils Relative: 1.9 %
HCT: 36.8 % (ref 35.0–45.0)
Hemoglobin: 12.6 g/dL (ref 11.7–15.5)
Lymphs Abs: 1363 cells/uL (ref 850–3900)
MCH: 32.7 pg (ref 27.0–33.0)
MCHC: 34.2 g/dL (ref 32.0–36.0)
MCV: 95.6 fL (ref 80.0–100.0)
MPV: 9 fL (ref 7.5–12.5)
Monocytes Relative: 7.4 %
Neutro Abs: 3947 cells/uL (ref 1500–7800)
Neutrophils Relative %: 66.9 %
Platelets: 270 10*3/uL (ref 140–400)
RBC: 3.85 10*6/uL (ref 3.80–5.10)
RDW: 11.9 % (ref 11.0–15.0)
Total Lymphocyte: 23.1 %
WBC: 5.9 10*3/uL (ref 3.8–10.8)

## 2021-08-26 LAB — COMPLETE METABOLIC PANEL WITH GFR
AG Ratio: 1.8 (calc) (ref 1.0–2.5)
ALT: 9 U/L (ref 6–29)
AST: 14 U/L (ref 10–35)
Albumin: 4 g/dL (ref 3.6–5.1)
Alkaline phosphatase (APISO): 69 U/L (ref 37–153)
BUN: 16 mg/dL (ref 7–25)
CO2: 30 mmol/L (ref 20–32)
Calcium: 10.2 mg/dL (ref 8.6–10.4)
Chloride: 105 mmol/L (ref 98–110)
Creat: 0.66 mg/dL (ref 0.60–0.95)
Globulin: 2.2 g/dL (calc) (ref 1.9–3.7)
Glucose, Bld: 78 mg/dL (ref 65–99)
Potassium: 4.3 mmol/L (ref 3.5–5.3)
Sodium: 140 mmol/L (ref 135–146)
Total Bilirubin: 0.7 mg/dL (ref 0.2–1.2)
Total Protein: 6.2 g/dL (ref 6.1–8.1)
eGFR: 88 mL/min/{1.73_m2} (ref >=60)

## 2021-08-27 ENCOUNTER — Ambulatory Visit: Payer: Medicare Other | Admitting: Nurse Practitioner

## 2021-08-27 ENCOUNTER — Encounter: Payer: Self-pay | Admitting: Nurse Practitioner

## 2021-08-27 ENCOUNTER — Other Ambulatory Visit: Payer: Self-pay

## 2021-08-27 VITALS — BP 126/72 | HR 67 | Temp 97.3°F | Ht <= 58 in | Wt 152.0 lb

## 2021-08-27 DIAGNOSIS — K219 Gastro-esophageal reflux disease without esophagitis: Secondary | ICD-10-CM

## 2021-08-27 DIAGNOSIS — M8580 Other specified disorders of bone density and structure, unspecified site: Secondary | ICD-10-CM | POA: Diagnosis not present

## 2021-08-27 DIAGNOSIS — H6122 Impacted cerumen, left ear: Secondary | ICD-10-CM

## 2021-08-27 DIAGNOSIS — I83812 Varicose veins of left lower extremities with pain: Secondary | ICD-10-CM | POA: Diagnosis not present

## 2021-08-27 DIAGNOSIS — E785 Hyperlipidemia, unspecified: Secondary | ICD-10-CM

## 2021-08-27 DIAGNOSIS — D509 Iron deficiency anemia, unspecified: Secondary | ICD-10-CM | POA: Diagnosis not present

## 2021-08-27 DIAGNOSIS — I119 Hypertensive heart disease without heart failure: Secondary | ICD-10-CM

## 2021-08-27 NOTE — Progress Notes (Signed)
Careteam: Patient Care Team: [name], NP as PCP - General (Geriatric Medicine) Renard Hamper Marguerita Merles (Physician Assistant) Clent Jacks, MD as Consulting Physician (Ophthalmology) Rozetta Nunnery, MD as Consulting Physician (Otolaryngology)  PLACE OF SERVICE:  [place] Directive information Does Patient Have a Medical Advance Directive?: Yes, Type of Advance Directive: Out of facility DNR (pink MOST or yellow form), Pre-existing out of facility DNR order (yellow form or pink MOST form): Yellow form placed in chart (order not valid for inpatient use), Does patient want to make changes to medical advance directive?: No - Patient declined  Allergies  Allergen Reactions   Shellfish Allergy Nausea And Vomiting    Chief Complaint  Patient presents with   Medical Management of Chronic Issues    6 month follow-up and discuss labs (copy printed) . Discuss need for coivd booster #5 and td/tdap or postpone if patient refuses.      HPI: Patient is a 80 y.o. female for routine follow up Reports she is doing good, no complaints Helping take care of her husband (who is also very active)   Anemia- taking supplement every other day.  GERD- continues on protonix, doing much better on this medication. Takes medication around 3:30 pm and it has worked well.  She went to vascular due to painful veins, had injections which helped pain. Osteopenia- on vit d, not doing much exercise but active around the home.   Review of Systems:  Review of Systems  Constitutional:  Negative for chills, fever and weight loss.  HENT:  Negative for tinnitus.   Respiratory:  Negative for cough, sputum production and shortness of breath.   Cardiovascular:  Negative for chest pain, palpitations and leg swelling.  Gastrointestinal:  Negative for abdominal pain, constipation, diarrhea and heartburn.  Genitourinary:  Negative for dysuria, frequency and urgency.  Musculoskeletal:   Negative for back pain, falls, joint pain and myalgias.  Skin: Negative.   Neurological:  Negative for dizziness and headaches.  Psychiatric/Behavioral:  Negative for depression and memory loss. The patient does not have insomnia.    Past Medical History:  Diagnosis Date   Disorder of bone and cartilage, unspecified    Disorders of bursae and tendons in shoulder region, unspecified    Encounter for long-term (current) use of other medications    Esophageal reflux    Hyperosmolality and/or hypernatremia    Osteoporosis, unspecified    Other and unspecified hyperlipidemia    Pain in joint, shoulder region    Pain in limb    Palpitations    Pure hypercholesterolemia    Reflux esophagitis    Tachycardia, unspecified    Unspecified essential hypertension    Unspecified urinary incontinence    Past Surgical History:  Procedure Laterality Date   APPENDECTOMY  09/12/1953   Bone Density  08/14/2012   CATARACT EXTRACTION Bilateral 09/12/2008   CHOLECYSTECTOMY  09/12/2002   COLONOSCOPY  03/31/2009   Diverticulosis, Dr.John Amedeo Plenty    DIAGNOSTIC MAMMOGRAM     LAPAROSCOPY N/A 02/15/2018   Procedure: LAPAROSCOPY DIAGNOSTIC, LYSIS OF ADHESION;  Surgeon: Ralene Ok, MD;  Location: [facility];  Service: General;  Laterality: N/A;   VARICOSE VEIN SURGERY Left    Summer 2022, Dr.Featherson ([facility])   Social History:   reports that she has never smoked. She has never used smokeless tobacco. She reports that she does not drink alcohol and does not use drugs.  Family History  Problem Relation Age of Onset  Hypertension Mother    Diabetes Brother    Crohn's disease Neg Hx    Inflammatory bowel disease Neg Hx     Medications: Patient's Medications  New Prescriptions   No medications on file  Previous Medications   ACETAMINOPHEN (TYLENOL) 650 MG CR TABLET    Take 650 mg by mouth as needed.    ASPIRIN EC 81 MG TABLET    Take 81 mg by mouth daily.   ATENOLOL (TENORMIN) 25 MG  TABLET    TAKE 1 TABLET BY MOUTH EVERYDAY AT BEDTIME   BIOTIN 5000 MCG CAPS    Take by mouth daily.   CHOLECALCIFEROL (VITAMIN D) 2000 UNITS TABLET    Take 2,000 Units by mouth daily.    FERROUS SULFATE (IRON) 325 (65 FE) MG TABS    Take 1 tablet by mouth every other day.   PANTOPRAZOLE (PROTONIX) 40 MG TABLET    TAKE 1 TABLET BY MOUTH EVERY DAY   SIMVASTATIN (ZOCOR) 40 MG TABLET    TAKE 1 TABLET BY MOUTH DAILY AT 6:00PM  Modified Medications   No medications on file  Discontinued Medications   No medications on file    Physical Exam:  Vitals:   08/27/21 0839  BP: 126/72  Pulse: 67  Temp: (!) 97.3 F (36.3 C)  TempSrc: Temporal  SpO2: 99%  Weight: 152 lb (68.9 kg)  Height: 4' 10"  (1.473 m)   Body mass index is 31.77 kg/m. Wt Readings from Last 3 Encounters:  08/27/21 152 lb (68.9 kg)  02/24/21 150 lb (68 kg)  08/26/20 152 lb 9.6 oz (69.2 kg)    Physical Exam Constitutional:      General: She is not in acute distress.    Appearance: She is well-developed. She is not diaphoretic.  HENT:     Head: Normocephalic and atraumatic.     Right Ear: There is no impacted cerumen.     Left Ear: There is impacted cerumen.     Mouth/Throat:     Pharynx: No oropharyngeal exudate.  Eyes:     Conjunctiva/sclera: Conjunctivae normal.     Pupils: Pupils are equal, round, and reactive to light.  Cardiovascular:     Rate and Rhythm: Normal rate and regular rhythm.     Heart sounds: Normal heart sounds.  Pulmonary:     Effort: Pulmonary effort is normal.     Breath sounds: Normal breath sounds.  Abdominal:     General: Bowel sounds are normal.     Palpations: Abdomen is soft.  Musculoskeletal:        General: Normal range of motion.     Cervical back: Normal range of motion and neck supple.     Right lower leg: No edema.     Left lower leg: No edema.  Skin:    General: Skin is warm and dry.  Neurological:     Mental Status: She is alert and oriented to person, place, and time.  Mental status is at baseline.  Psychiatric:        Mood and Affect: Mood normal.    Labs reviewed: Basic Metabolic Panel: Recent Labs    02/24/21 1017 08/25/21 0819  NA 139 140  K 4.6 4.3  CL 104 105  CO2 27 30  GLUCOSE 81 78  BUN 19 16  CREATININE 0.71 0.66  CALCIUM 10.6* 10.2   Liver Function Tests: Recent Labs    02/24/21 1017 08/25/21 0819  AST 16 14  ALT 10 9  BILITOT  0.7 0.7  PROT 6.6 6.2   No results for input(s): LIPASE, AMYLASE in the last 8760 hours. No results for input(s): AMMONIA in the last 8760 hours. CBC: Recent Labs    02/24/21 1017 08/25/21 0819  WBC 5.2 5.9  NEUTROABS 3,240 3,947  HGB 13.6 12.6  HCT 41.5 36.8  MCV 94.3 95.6  PLT 269 270   Lipid Panel: Recent Labs    02/24/21 1017  CHOL 205*  HDL 61  LDLCALC 119*  TRIG 131  CHOLHDL 3.4   TSH: No results for input(s): TSH in the last 8760 hours. A1C: No results found for: HGBA1C   Assessment/Plan 1. Iron deficiency anemia, unspecified iron deficiency anemia type -hgb in normal range. On iron every other day- can decrease to twice weekly and will follow up at next visit. - CBC with Differential/Platelet; Future  2. Osteopenia, unspecified location -Recommended to take Vitamin D 2000 units daily and weight bearing activity 30 mins/5 days a week  3. Varicose veins of left lower extremity with pain - followed by vascular. Improvement in symptoms after injection.   4. Gastroesophageal reflux disease without esophagitis -improved with protonix.  5. Hyperlipidemia with target LDL less than 130 -continue with dietary modifications with zocor - Lipid panel; Future  6. Benign hypertensive heart disease without heart failure -stable on atenolol  - CBC with Differential/Platelet; Future - CMP; Future  7. Hearing loss of left ear due to cerumen impaction -cerumen removed with lavage and grabbers, pt tolerated well and reports improvement in hearing    Next appt: 6 months, labs  prior  Brooke Cox K. [facility], [facility] [phone]

## 2021-08-31 ENCOUNTER — Other Ambulatory Visit: Payer: Self-pay

## 2021-08-31 ENCOUNTER — Ambulatory Visit (INDEPENDENT_AMBULATORY_CARE_PROVIDER_SITE_OTHER): Payer: Medicare Other | Admitting: Nurse Practitioner

## 2021-08-31 ENCOUNTER — Encounter: Payer: Self-pay | Admitting: Nurse Practitioner

## 2021-08-31 DIAGNOSIS — E2839 Other primary ovarian failure: Secondary | ICD-10-CM

## 2021-08-31 DIAGNOSIS — Z0000 Encounter for general adult medical examination without abnormal findings: Secondary | ICD-10-CM | POA: Diagnosis not present

## 2021-08-31 NOTE — Progress Notes (Signed)
Subjective:   Brooke Cox is a 80 y.o. female who presents for Medicare Annual (Subsequent) preventive examination.  Review of Systems     Cardiac Risk Factors include: advanced age (>55mn, >>58women);dyslipidemia;hypertension;obesity (BMI >30kg/m2)     Objective:    There were no vitals filed for this visit. There is no height or weight on file to calculate BMI.  Advanced Directives 08/31/2021 08/27/2021 02/24/2021 08/26/2020 08/24/2020 05/07/2020 04/17/2020  Does Patient Have a Medical Advance Directive? Yes Yes Yes Yes Yes No Yes  Type of Advance Directive Out of facility DNR (pink MOST or yellow form) Out of facility DNR (pink MOST or yellow form) Out of facility DNR (pink MOST or yellow form) Out of facility DNR (pink MOST or yellow form) Out of facility DNR (pink MOST or yellow form) - Out of facility DNR (pink MOST or yellow form)  Does patient want to make changes to medical advance directive? No - Patient declined No - Patient declined No - Patient declined No - Patient declined No - Patient declined No - Patient declined No - Patient declined  Copy of HSedanin CGloster Would patient like information on creating a medical advance directive? - - - - - - -  Pre-existing out of facility DNR order (yellow form or pink MOST form) Pink MOST form placed in chart (order not valid for inpatient use);Yellow form placed in chart (order not valid for inpatient use) Yellow form placed in chart (order not valid for inpatient use) Yellow form placed in chart (order not valid for inpatient use) Yellow form placed in chart (order not valid for inpatient use);Pink MOST form placed in chart (order not valid for inpatient use) - - Pink MOST/Yellow Form most recent copy in chart - Physician notified to receive inpatient order    Current Medications (verified) Outpatient Encounter Medications as of 08/31/2021  Medication Sig   acetaminophen (TYLENOL) 650 MG CR  tablet Take 650 mg by mouth as needed.    aspirin EC 81 MG tablet Take 81 mg by mouth daily.   atenolol (TENORMIN) 25 MG tablet TAKE 1 TABLET BY MOUTH EVERYDAY AT BEDTIME   Biotin 5000 MCG CAPS Take by mouth daily.   Cholecalciferol (VITAMIN D) 2000 UNITS tablet Take 2,000 Units by mouth daily.    Ferrous Sulfate (IRON) 325 (65 Fe) MG TABS Take 1 tablet by mouth every other day.   pantoprazole (PROTONIX) 40 MG tablet TAKE 1 TABLET BY MOUTH EVERY DAY   simvastatin (ZOCOR) 40 MG tablet TAKE 1 TABLET BY MOUTH DAILY AT 6:00PM   No facility-administered encounter medications on file as of 08/31/2021.    Allergies (verified) Shellfish allergy   History: Past Medical History:  Diagnosis Date   Disorder of bone and cartilage, unspecified    Disorders of bursae and tendons in shoulder region, unspecified    Encounter for long-term (current) use of other medications    Esophageal reflux    Hyperosmolality and/or hypernatremia    Osteoporosis, unspecified    Other and unspecified hyperlipidemia    Pain in joint, shoulder region    Pain in limb    Palpitations    Pure hypercholesterolemia    Reflux esophagitis    Tachycardia, unspecified    Unspecified essential hypertension    Unspecified urinary incontinence    Past Surgical History:  Procedure Laterality Date   APPENDECTOMY  09/12/1953   Bone Density  08/14/2012  CATARACT EXTRACTION Bilateral 09/12/2008   CHOLECYSTECTOMY  09/12/2002   COLONOSCOPY  03/31/2009   Diverticulosis, Dr.John Amedeo Plenty    DIAGNOSTIC MAMMOGRAM     LAPAROSCOPY N/A 02/15/2018   Procedure: LAPAROSCOPY DIAGNOSTIC, LYSIS OF ADHESION;  Surgeon: Ralene Ok, MD;  Location: [facility];  Service: General;  Laterality: N/A;   VARICOSE VEIN SURGERY Left    Summer 2022, Dr.Featherson ([facility])   Family History  Problem Relation Age of Onset   Hypertension Mother    Diabetes Brother    Crohn's disease Neg Hx    Inflammatory bowel disease Neg Hx    Social  History   Socioeconomic History   Marital status: Married    Spouse name: Not on file   Number of children: Not on file   Years of education: Not on file   Highest education level: Not on file  Occupational History   Occupation: retired  Tobacco Use   Smoking status: Never   Smokeless tobacco: Never  Vaping Use   Vaping Use: Never used  Substance and Sexual Activity   Alcohol use: No   Drug use: No   Sexual activity: Not Currently  Other Topics Concern   Not on file  Social History Narrative   Not on file   Social Determinants of Health   Financial Resource Strain: Not on file  Food Insecurity: Not on file  Transportation Needs: Not on file  Physical Activity: Not on file  Stress: Not on file  Social Connections: Not on file    Tobacco Counseling Counseling given: Not Answered   Clinical Intake:  Pre-visit preparation completed: Yes  Pain : No/denies pain     BMI - recorded: 31 Nutritional Status: BMI > 30  Obese  How often do you need to have someone help you when you read instructions, pamphlets, or other written materials from your doctor or pharmacy?: 1 - Never  Diabetic?no         Activities of Daily Living In your present state of health, do you have any difficulty performing the following activities: 08/31/2021  Hearing? N  Vision? N  Walking or climbing stairs? N  Dressing or bathing? N  Doing errands, shopping? N  Preparing Food and eating ? N  Using the Toilet? N  In the past six months, have you accidently leaked urine? Y  Do you have problems with loss of bowel control? N  Managing your Medications? N  Managing your Finances? N  Housekeeping or managing your Housekeeping? N  Some recent data might be hidden    Patient Care Team: [name], NP as PCP - General (Geriatric Medicine) Renard Hamper Marguerita Merles (Physician Assistant) Clent Jacks, MD as Consulting Physician (Ophthalmology) Rozetta Nunnery, MD as  Consulting Physician (Otolaryngology)  Indicate any recent Medical Services you may have received from other than [facility] providers in the past year (date may be approximate).     Assessment:   This is a routine wellness examination for Brooke Cox.  Hearing/Vision screen Hearing Screening - Comments:: No hearing problems Vision Screening - Comments:: No vision problems. Patient eye exam is due every 2 years. Had eye exam this year. Patient sees Dr. Katy Fitch.  Dietary issues and exercise activities discussed: Current Exercise Habits: The patient does not participate in regular exercise at present   Goals Addressed   None    Depression Screen PHQ 2/9 Scores 08/31/2021 08/27/2021 08/24/2020 08/23/2019 02/12/2019 08/20/2018 08/07/2017  PHQ - 2 Score 0 0 0 0 0 0  0    Fall Risk Fall Risk  08/31/2021 08/27/2021 08/24/2020 04/17/2020 02/21/2020  Falls in the past year? 0 0 0 0 0  Number falls in past yr: 0 0 0 0 0  Injury with Fall? 1 0 0 0 0  Risk for fall due to : No Fall Risks No Fall Risks - - -  Follow up Falls evaluation completed Falls evaluation completed - - -    FALL RISK PREVENTION PERTAINING TO THE HOME:  Any stairs in or around the home? Yes  If so, are there any without handrails? No  Home free of loose throw rugs in walkways, pet beds, electrical cords, etc? Yes  Adequate lighting in your home to reduce risk of falls? Yes   ASSISTIVE DEVICES UTILIZED TO PREVENT FALLS:  Life alert? No  Use of a cane, walker or w/c? No  Grab bars in the bathroom? Yes  Shower chair or bench in shower? No  Elevated toilet seat or a handicapped toilet? No   TIMED UP AND GO:  Was the test performed? No .   Cognitive Function: MMSE - Mini Mental State Exam 08/20/2018 08/07/2017 08/01/2016 07/30/2015 07/23/2014  Orientation to time 5 5 5 5 5   Orientation to Place 5 5 5 5 5   Registration 3 3 3 3 3   Attention/ Calculation 5 5 5 5 5   Recall 3 2 3 3 3   Language- name 2 objects 2 2 2 2 2    Language- repeat 1 1 1 1 1   Language- follow 3 step command 3 3 3 3 3   Language- read & follow direction 1 1 1 1 1   Write a sentence 1 1 1 1 1   Copy design 1 1 1 1 1   Total score 30 29 30 30 30      6CIT Screen 08/31/2021 08/24/2020 08/23/2019  What Year? 0 points 0 points 0 points  What month? 0 points 0 points 0 points  What time? 0 points 0 points 0 points  Count back from 20 0 points 0 points 0 points  Months in reverse 0 points 0 points 0 points  Repeat phrase 0 points 0 points 2 points  Total Score 0 0 2    Immunizations Immunization History  Administered Date(s) Administered   Fluad Quad(high Dose 65+) 07/09/2019, 06/10/2020, 07/01/2021   Influenza, High Dose Seasonal PF 07/12/2017, 06/22/2018   Influenza,inj,Quad PF,6+ Mos 07/03/2013, 07/30/2015, 08/01/2016   Influenza-Unspecified 07/03/2012, 06/12/2014   PFIZER Comirnaty(Gray Top)Covid-19 Tri-Sucrose Vaccine 12/21/2020   PFIZER(Purple Top)SARS-COV-2 Vaccination 10/02/2019, 10/20/2019, 08/12/2020   Pneumococcal Conjugate-13 07/23/2014   Pneumococcal Polysaccharide-23 02/20/2008   Tdap 07/05/2011   Zoster Recombinat (Shingrix) 09/06/2018, 03/27/2019   Zoster, Live 07/28/2011    TDAP status: Due, Education has been provided regarding the importance of this vaccine. Advised may receive this vaccine at local pharmacy or Health Dept. Aware to provide a copy of the vaccination record if obtained from local pharmacy or Health Dept. Verbalized acceptance and understanding.  Flu Vaccine status: Up to date  Pneumococcal vaccine status: Up to date  Covid-19 vaccine status: Information provided on how to obtain vaccines.   Qualifies for Shingles Vaccine? Yes   Zostavax completed Yes   Shingrix Completed?: Yes  Screening Tests Health Maintenance  Topic Date Due   COVID-19 Vaccine (5 - Booster for Pfizer series) 02/15/2021   TETANUS/TDAP  07/04/2021   MAMMOGRAM  06/01/2022   Pneumonia Vaccine 64+ Years old  Completed    INFLUENZA VACCINE  Completed  DEXA SCAN  Completed   Zoster Vaccines- Shingrix  Completed   HPV VACCINES  Aged Out    Health Maintenance  Health Maintenance Due  Topic Date Due   COVID-19 Vaccine (5 - Booster for Pfizer series) 02/15/2021   TETANUS/TDAP  07/04/2021    Colorectal cancer screening: No longer required.   Mammogram status: No longer required due to age.  Bone Density status: Ordered  . Pt provided with contact info and advised to call to schedule appt.  Lung Cancer Screening: (Low Dose CT Chest recommended if Age 3-80 years, 30 pack-year currently smoking OR have quit w/in 15years.) does not qualify.   Lung Cancer Screening Referral: na  Additional Screening:  Hepatitis C Screening: does not qualify; Completed   Vision Screening: Recommended annual ophthalmology exams for early detection of glaucoma and other disorders of the eye. Is the patient up to date with their annual eye exam?  Yes  Who is the provider or what is the name of the office in which the patient attends annual eye exams? [facility] If pt is not established with a provider, would they like to be referred to a provider to establish care? No .   Dental Screening: Recommended annual dental exams for proper oral hygiene  Community Resource Referral / Chronic Care Management: CRR required this visit?  No   CCM required this visit?  No      Plan:     I have personally reviewed and noted the following in the patients chart:   Medical and social history Use of alcohol, tobacco or illicit drugs  Current medications and supplements including opioid prescriptions.  Functional ability and status Nutritional status Physical activity Advanced directives List of other physicians Hospitalizations, surgeries, and ER visits in previous 12 months Vitals Screenings to include cognitive, depression, and falls Referrals and appointments  In addition, I have reviewed and discussed with patient certain  preventive protocols, quality metrics, and best practice recommendations. A written personalized care plan for preventive services as well as general preventive health recommendations were provided to patient.     [name], NP   08/31/2021    Virtual Visit via Telephone Note  I connected withNAME@ on 08/31/21 at  2:45 PM EST by telephone and verified that I am speaking with the correct person using two identifiers.  Location: Patient: home Provider: [facility]   I discussed the limitations, risks, security and privacy concerns of performing an evaluation and management service by telephone and the availability of in person appointments. I also discussed with the patient that there may be a patient responsible charge related to this service. The patient expressed understanding and agreed to proceed.   I discussed the assessment and treatment plan with the patient. The patient was provided an opportunity to ask questions and all were answered. The patient agreed with the plan and demonstrated an understanding of the instructions.   The patient was advised to call back or seek an in-person evaluation if the symptoms worsen or if the condition fails to improve as anticipated.  I provided 15 minutes of non-face-to-face time during this encounter.  [employer]. Harle Battiest Avs printed and mailed

## 2021-08-31 NOTE — Progress Notes (Signed)
This service is provided via telemedicine  No vital signs collected/recorded due to the encounter was a telemedicine visit.   Location of patient (ex: home, work):  Home  Patient consents to a telephone visit:  Yes, see encounter dated 08/31/2021  Location of the provider (ex: office, home):  [place]  Name of any referring provider:  N/A  Names of all persons participating in the telemedicine service and their role in the encounter:  Sherrie Mustache, Nurse Practitioner, Carroll Kinds, CMA, and patient.   Time spent on call:  10 minutes with medical assistant

## 2021-08-31 NOTE — Patient Instructions (Signed)
Brooke Cox , Thank you for taking time to come for your Medicare Wellness Visit. I appreciate your ongoing commitment to your health goals. Please review the following plan we discussed and let me know if I can assist you in the future.   Screening recommendations/referrals: Colonoscopy aged out Mammogram up to date Bone Density ordered today- call to schedule Recommended yearly ophthalmology/optometry visit for glaucoma screening and checkup Recommended yearly dental visit for hygiene and checkup  Vaccinations: Influenza vaccine up to date  Pneumococcal vaccine up to date Tdap vaccine RECOMMENDED- to get at local pharmacy  Shingles vaccine up to date    Advanced directives: on file.   Conditions/risks identified: advance age, BMI >30, hyperlipidemia  Next appointment: yearly    Preventive Care 44 Years and Older, Female Preventive care refers to lifestyle choices and visits with your health care provider that can promote health and wellness. What does preventive care include? A yearly physical exam. This is also called an annual well check. Dental exams once or twice a year. Routine eye exams. Ask your health care provider how often you should have your eyes checked. Personal lifestyle choices, including: Daily care of your teeth and gums. Regular physical activity. Eating a healthy diet. Avoiding tobacco and drug use. Limiting alcohol use. Practicing safe sex. Taking low-dose aspirin every day. Taking vitamin and mineral supplements as recommended by your health care provider. What happens during an annual well check? The services and screenings done by your health care provider during your annual well check will depend on your age, overall health, lifestyle risk factors, and family history of disease. Counseling  Your health care provider may ask you questions about your: Alcohol use. Tobacco use. Drug use. Emotional well-being. Home and relationship  well-being. Sexual activity. Eating habits. History of falls. Memory and ability to understand (cognition). Work and work Statistician. Reproductive health. Screening  You may have the following tests or measurements: Height, weight, and BMI. Blood pressure. Lipid and cholesterol levels. These may be checked every 5 years, or more frequently if you are over 36 years old. Skin check. Lung cancer screening. You may have this screening every year starting at age 40 if you have a 30-pack-year history of smoking and currently smoke or have quit within the past 15 years. Fecal occult blood test (FOBT) of the stool. You may have this test every year starting at age 97. Flexible sigmoidoscopy or colonoscopy. You may have a sigmoidoscopy every 5 years or a colonoscopy every 10 years starting at age 43. Hepatitis C blood test. Hepatitis B blood test. Sexually transmitted disease (STD) testing. Diabetes screening. This is done by checking your blood sugar (glucose) after you have not eaten for a while (fasting). You may have this done every 1-3 years. Bone density scan. This is done to screen for osteoporosis. You may have this done starting at age 39. Mammogram. This may be done every 1-2 years. Talk to your health care provider about how often you should have regular mammograms. Talk with your health care provider about your test results, treatment options, and if necessary, the need for more tests. Vaccines  Your health care provider may recommend certain vaccines, such as: Influenza vaccine. This is recommended every year. Tetanus, diphtheria, and acellular pertussis (Tdap, Td) vaccine. You may need a Td booster every 10 years. Zoster vaccine. You may need this after age 5. Pneumococcal 13-valent conjugate (PCV13) vaccine. One dose is recommended after age 75. Pneumococcal polysaccharide (PPSV23) vaccine. One dose is  recommended after age 55. Talk to your health care provider about which  screenings and vaccines you need and how often you need them. This information is not intended to replace advice given to you by your health care provider. Make sure you discuss any questions you have with your health care provider. Document Released: 09/25/2015 Document Revised: 05/18/2016 Document Reviewed: 06/30/2015 Elsevier Interactive Patient Education  2017 [place] Prevention in the Home Falls can cause injuries. They can happen to people of all ages. There are many things you can do to make your home safe and to help prevent falls. What can I do on the outside of my home? Regularly fix the edges of walkways and driveways and fix any cracks. Remove anything that might make you trip as you walk through a door, such as a raised step or threshold. Trim any bushes or trees on the path to your home. Use bright outdoor lighting. Clear any walking paths of anything that might make someone trip, such as rocks or tools. Regularly check to see if handrails are loose or broken. Make sure that both sides of any steps have handrails. Any raised decks and porches should have guardrails on the edges. Have any leaves, snow, or ice cleared regularly. Use sand or salt on walking paths during winter. Clean up any spills in your garage right away. This includes oil or grease spills. What can I do in the bathroom? Use night lights. Install grab bars by the toilet and in the tub and shower. Do not use towel bars as grab bars. Use non-skid mats or decals in the tub or shower. If you need to sit down in the shower, use a plastic, non-slip stool. Keep the floor dry. Clean up any water that spills on the floor as soon as it happens. Remove soap buildup in the tub or shower regularly. Attach bath mats securely with double-sided non-slip rug tape. Do not have throw rugs and other things on the floor that can make you trip. What can I do in the bedroom? Use night lights. Make sure that you have a  light by your bed that is easy to reach. Do not use any sheets or blankets that are too big for your bed. They should not hang down onto the floor. Have a firm chair that has side arms. You can use this for support while you get dressed. Do not have throw rugs and other things on the floor that can make you trip. What can I do in the kitchen? Clean up any spills right away. Avoid walking on wet floors. Keep items that you use a lot in easy-to-reach places. If you need to reach something above you, use a strong step stool that has a grab bar. Keep electrical cords out of the way. Do not use floor polish or wax that makes floors slippery. If you must use wax, use non-skid floor wax. Do not have throw rugs and other things on the floor that can make you trip. What can I do with my stairs? Do not leave any items on the stairs. Make sure that there are handrails on both sides of the stairs and use them. Fix handrails that are broken or loose. Make sure that handrails are as long as the stairways. Check any carpeting to make sure that it is firmly attached to the stairs. Fix any carpet that is loose or worn. Avoid having throw rugs at the top or bottom of the stairs. If  you do have throw rugs, attach them to the floor with carpet tape. Make sure that you have a light switch at the top of the stairs and the bottom of the stairs. If you do not have them, ask someone to add them for you. What else can I do to help prevent falls? Wear shoes that: Do not have high heels. Have rubber bottoms. Are comfortable and fit you well. Are closed at the toe. Do not wear sandals. If you use a stepladder: Make sure that it is fully opened. Do not climb a closed stepladder. Make sure that both sides of the stepladder are locked into place. Ask someone to hold it for you, if possible. Clearly mark and make sure that you can see: Any grab bars or handrails. First and last steps. Where the edge of each step  is. Use tools that help you move around (mobility aids) if they are needed. These include: Canes. Walkers. Scooters. Crutches. Turn on the lights when you go into a dark area. Replace any light bulbs as soon as they burn out. Set up your furniture so you have a clear path. Avoid moving your furniture around. If any of your floors are uneven, fix them. If there are any pets around you, be aware of where they are. Review your medicines with your doctor. Some medicines can make you feel dizzy. This can increase your chance of falling. Ask your doctor what other things that you can do to help prevent falls. This information is not intended to replace advice given to you by your health care provider. Make sure you discuss any questions you have with your health care provider. Document Released: 06/25/2009 Document Revised: 02/04/2016 Document Reviewed: 10/03/2014 Elsevier Interactive Patient Education  2017 [employer].

## 2021-09-03 ENCOUNTER — Other Ambulatory Visit: Payer: Self-pay | Admitting: Nurse Practitioner

## 2021-09-03 DIAGNOSIS — I119 Hypertensive heart disease without heart failure: Secondary | ICD-10-CM

## 2021-09-13 ENCOUNTER — Other Ambulatory Visit: Payer: Self-pay | Admitting: Nurse Practitioner

## 2021-09-13 DIAGNOSIS — K219 Gastro-esophageal reflux disease without esophagitis: Secondary | ICD-10-CM

## 2022-02-25 ENCOUNTER — Other Ambulatory Visit: Payer: Self-pay | Admitting: Nurse Practitioner

## 2022-02-25 DIAGNOSIS — E785 Hyperlipidemia, unspecified: Secondary | ICD-10-CM

## 2022-02-28 ENCOUNTER — Other Ambulatory Visit: Payer: Self-pay | Admitting: Nurse Practitioner

## 2022-02-28 DIAGNOSIS — I119 Hypertensive heart disease without heart failure: Secondary | ICD-10-CM

## 2022-02-28 DIAGNOSIS — E785 Hyperlipidemia, unspecified: Secondary | ICD-10-CM

## 2022-02-28 DIAGNOSIS — D509 Iron deficiency anemia, unspecified: Secondary | ICD-10-CM

## 2022-03-01 ENCOUNTER — Other Ambulatory Visit: Payer: Medicare Other

## 2022-03-02 ENCOUNTER — Other Ambulatory Visit: Payer: Self-pay | Admitting: Nurse Practitioner

## 2022-03-02 DIAGNOSIS — I119 Hypertensive heart disease without heart failure: Secondary | ICD-10-CM

## 2022-03-02 LAB — COMPREHENSIVE METABOLIC PANEL
AG Ratio: 1.9 (calc) (ref 1.0–2.5)
ALT: 8 U/L (ref 6–29)
AST: 14 U/L (ref 10–35)
Albumin: 4 g/dL (ref 3.6–5.1)
Alkaline phosphatase (APISO): 62 U/L (ref 37–153)
BUN: 17 mg/dL (ref 7–25)
CO2: 26 mmol/L (ref 20–32)
Calcium: 10.3 mg/dL (ref 8.6–10.4)
Chloride: 108 mmol/L (ref 98–110)
Creat: 0.65 mg/dL (ref 0.60–0.95)
Globulin: 2.1 g/dL (calc) (ref 1.9–3.7)
Glucose, Bld: 80 mg/dL (ref 65–99)
Potassium: 4.3 mmol/L (ref 3.5–5.3)
Sodium: 142 mmol/L (ref 135–146)
Total Bilirubin: 0.5 mg/dL (ref 0.2–1.2)
Total Protein: 6.1 g/dL (ref 6.1–8.1)

## 2022-03-02 LAB — CBC WITH DIFFERENTIAL/PLATELET
Absolute Monocytes: 330 cells/uL (ref 200–950)
Basophils Absolute: 31 cells/uL (ref 0–200)
Basophils Relative: 0.7 %
Eosinophils Absolute: 79 cells/uL (ref 15–500)
Eosinophils Relative: 1.8 %
HCT: 37.2 % (ref 35.0–45.0)
Hemoglobin: 12.7 g/dL (ref 11.7–15.5)
Lymphs Abs: 1179 cells/uL (ref 850–3900)
MCH: 31.7 pg (ref 27.0–33.0)
MCHC: 34.1 g/dL (ref 32.0–36.0)
MCV: 92.8 fL (ref 80.0–100.0)
MPV: 9.4 fL (ref 7.5–12.5)
Monocytes Relative: 7.5 %
Neutro Abs: 2781 cells/uL (ref 1500–7800)
Neutrophils Relative %: 63.2 %
Platelets: 282 10*3/uL (ref 140–400)
RBC: 4.01 10*6/uL (ref 3.80–5.10)
RDW: 12.2 % (ref 11.0–15.0)
Total Lymphocyte: 26.8 %
WBC: 4.4 10*3/uL (ref 3.8–10.8)

## 2022-03-02 LAB — LIPID PANEL
Cholesterol: 160 mg/dL (ref <200)
HDL: 57 mg/dL (ref >=50)
LDL Cholesterol (Calc): 80 mg/dL (calc)
Non-HDL Cholesterol (Calc): 103 mg/dL (calc) (ref <130)
Total CHOL/HDL Ratio: 2.8 (calc) (ref <5.0)
Triglycerides: 134 mg/dL (ref <150)

## 2022-03-04 ENCOUNTER — Ambulatory Visit: Payer: Medicare Other | Admitting: Nurse Practitioner

## 2022-03-04 ENCOUNTER — Encounter: Payer: Self-pay | Admitting: Nurse Practitioner

## 2022-03-04 VITALS — BP 124/76 | HR 62 | Temp 98.2°F | Ht <= 58 in | Wt 149.0 lb

## 2022-03-04 DIAGNOSIS — K219 Gastro-esophageal reflux disease without esophagitis: Secondary | ICD-10-CM

## 2022-03-04 DIAGNOSIS — K5900 Constipation, unspecified: Secondary | ICD-10-CM

## 2022-03-04 DIAGNOSIS — M5431 Sciatica, right side: Secondary | ICD-10-CM

## 2022-03-04 DIAGNOSIS — H6121 Impacted cerumen, right ear: Secondary | ICD-10-CM | POA: Diagnosis not present

## 2022-03-04 DIAGNOSIS — I119 Hypertensive heart disease without heart failure: Secondary | ICD-10-CM

## 2022-03-04 DIAGNOSIS — E785 Hyperlipidemia, unspecified: Secondary | ICD-10-CM

## 2022-03-04 DIAGNOSIS — D509 Iron deficiency anemia, unspecified: Secondary | ICD-10-CM | POA: Diagnosis not present

## 2022-03-04 DIAGNOSIS — M8580 Other specified disorders of bone density and structure, unspecified site: Secondary | ICD-10-CM | POA: Diagnosis not present

## 2022-04-26 ENCOUNTER — Ambulatory Visit: Payer: Medicare Other | Admitting: Family

## 2022-04-26 ENCOUNTER — Encounter: Payer: Self-pay | Admitting: Family

## 2022-04-26 VITALS — BP 116/60 | HR 83 | Temp 97.3°F | Resp 18 | Ht <= 58 in | Wt 152.0 lb

## 2022-04-26 DIAGNOSIS — T7840XA Allergy, unspecified, initial encounter: Secondary | ICD-10-CM | POA: Diagnosis not present

## 2022-04-26 DIAGNOSIS — R0989 Other specified symptoms and signs involving the circulatory and respiratory systems: Secondary | ICD-10-CM | POA: Diagnosis not present

## 2022-04-26 DIAGNOSIS — R21 Rash and other nonspecific skin eruption: Secondary | ICD-10-CM

## 2022-04-26 MED ORDER — PREDNISONE 20 MG PO TABS: ORAL_TABLET | ORAL | 0 refills | Status: AC

## 2022-04-26 MED ORDER — LORATADINE 10 MG PO TABS: 10.0000 mg | ORAL_TABLET | Freq: Every day | ORAL | 11 refills | Status: DC

## 2022-04-26 NOTE — Progress Notes (Signed)
Provider: Bartlett Enke FNP-C  [name], NP  Patient Care Team: [name], NP as PCP - General (Geriatric Medicine) Renard Hamper Marguerita Merles (Physician Assistant) Clent Jacks, MD as Consulting Physician (Ophthalmology) Rozetta Nunnery, MD (Inactive) as Consulting Physician (Otolaryngology)  Extended Emergency Contact Information Primary Emergency Contact: Hengst,Dewey Address: [address]          [place] [address] Johnnette Litter of [place] Phone: [phone] Relation: Spouse  Code Status:  DNR Goals of care: Advanced Directive information    08/31/2021    2:38 PM  Advanced Directives  Does Patient Have a Medical Advance Directive? Yes  Type of Advance Directive Out of facility DNR (pink MOST or yellow form)  Does patient want to make changes to medical advance directive? No - Patient declined  Pre-existing out of facility DNR order (yellow form or pink MOST form) Pink MOST form placed in chart (order not valid for inpatient use);Yellow form placed in chart (order not valid for inpatient use)     Chief Complaint  Patient presents with   Acute Visit    Patient complains of rash on legs and arms.     HPI:  Pt is a 81 y.o. female seen today for an acute visit for evaluation of rash on legs and arms that she noticed today.describes rash as itching.she denies any fever,chills,body aches,Nausea or vomiting.No recent change in body lotion,soap or detergent.   States was prescribed Amoxicillin 500 mg tablet  for recent root canal done last week.she took her last dose today.  Also complains of runny nose.She denies any fever,chills,cough,fatigue,body aches,,chest tightness,chest pain,palpitation or shortness of breath.   Past Medical History:  Diagnosis Date   Cancer of skin of scalp    2023   Disorder of bone and cartilage, unspecified    Disorders of bursae and tendons in shoulder region, unspecified    Encounter for long-term  (current) use of other medications    Esophageal reflux    Hyperosmolality and/or hypernatremia    Osteoporosis, unspecified    Other and unspecified hyperlipidemia    Pain in joint, shoulder region    Pain in limb    Palpitations    Pure hypercholesterolemia    Reflux esophagitis    Tachycardia, unspecified    Unspecified essential hypertension    Unspecified urinary incontinence    Past Surgical History:  Procedure Laterality Date   APPENDECTOMY  09/12/1953   Bone Density  08/14/2012   CATARACT EXTRACTION Bilateral 09/12/2008   CHOLECYSTECTOMY  09/12/2002   COLONOSCOPY  03/31/2009   Diverticulosis, Dr.John Amedeo Plenty    DIAGNOSTIC MAMMOGRAM     LAPAROSCOPY N/A 02/15/2018   Procedure: LAPAROSCOPY DIAGNOSTIC, LYSIS OF ADHESION;  Surgeon: Ralene Ok, MD;  Location: [facility];  Service: General;  Laterality: N/A;   VARICOSE VEIN SURGERY Left    Summer 2022, Dr.Featherson ( Vein)    Allergies  Allergen Reactions   Shellfish Allergy Nausea And Vomiting    Outpatient Encounter Medications as of 04/26/2022  Medication Sig   acetaminophen (TYLENOL) 650 MG CR tablet Take 650 mg by mouth as needed.    aspirin EC 81 MG tablet Take 81 mg by mouth daily.   atenolol (TENORMIN) 25 MG tablet TAKE 1 TABLET BY MOUTH EVERYDAY AT BEDTIME   Biotin 5000 MCG CAPS Take by mouth daily.   Cholecalciferol (VITAMIN D) 2000 UNITS tablet Take 2,000 Units by mouth daily.    Ferrous Sulfate (IRON) 325 (65 Fe) MG TABS Take  1 tablet by mouth every other day.   pantoprazole (PROTONIX) 40 MG tablet TAKE 1 TABLET BY MOUTH EVERY DAY   simvastatin (ZOCOR) 40 MG tablet TAKE 1 TABLET BY MOUTH DAILY AT 6:00PM   No facility-administered encounter medications on file as of 04/26/2022.    Review of Systems  Constitutional:  Negative for appetite change, chills, fatigue, fever and unexpected weight change.  HENT:  Positive for rhinorrhea. Negative for congestion, dental problem, ear discharge, ear pain,  facial swelling, hearing loss, nosebleeds, postnasal drip, sinus pressure, sinus pain, sneezing, sore throat and trouble swallowing.   Eyes:  Negative for pain, discharge, redness, itching and visual disturbance.  Respiratory:  Negative for cough, chest tightness, shortness of breath and wheezing.   Cardiovascular:  Negative for chest pain, palpitations and leg swelling.  Gastrointestinal:  Negative for abdominal distention, abdominal pain, constipation, diarrhea, nausea and vomiting.  Genitourinary:  Negative for difficulty urinating, dysuria, flank pain, frequency and urgency.  Musculoskeletal:  Negative for arthralgias, back pain, gait problem, joint swelling, myalgias, neck pain and neck stiffness.  Skin:  Positive for rash. Negative for color change, pallor and wound.  Neurological:  Negative for dizziness, speech difficulty, weakness, light-headedness, numbness and headaches.  Hematological:  Does not bruise/bleed easily.  Psychiatric/Behavioral:  Negative for agitation, behavioral problems, confusion, hallucinations and sleep disturbance. The patient is not nervous/anxious.     Immunization History  Administered Date(s) Administered   Fluad Quad(high Dose 65+) 07/09/2019, 06/10/2020, 07/01/2021   Influenza, High Dose Seasonal PF 07/12/2017, 06/22/2018   Influenza,inj,Quad PF,6+ Mos 07/03/2013, 07/30/2015, 08/01/2016   Influenza-Unspecified 07/03/2012, 06/12/2014   PFIZER Comirnaty(Gray Top)Covid-19 Tri-Sucrose Vaccine 12/21/2020   PFIZER(Purple Top)SARS-COV-2 Vaccination 10/02/2019, 10/20/2019, 08/12/2020   Pneumococcal Conjugate-13 07/23/2014   Pneumococcal Polysaccharide-23 02/20/2008   Tdap 07/05/2011   Zoster Recombinat (Shingrix) 09/06/2018, 03/27/2019   Zoster, Live 07/28/2011   Pertinent  Health Maintenance Due  Topic Date Due   INFLUENZA VACCINE  04/12/2022   MAMMOGRAM  06/01/2022   DEXA SCAN  Completed      04/17/2020    3:19 PM 08/24/2020   10:51 AM 08/27/2021     8:37 AM 08/31/2021    2:36 PM 03/04/2022    8:08 AM  Fall Risk  Falls in the past year? 0 0 0 0 0  Was there an injury with Fall? 0 0 0 1 0  Fall Risk Category Calculator 0 0 0 1 0  Fall Risk Category Low Low Low Low Low  Patient Fall Risk Level Low fall risk Low fall risk Low fall risk Low fall risk Low fall risk  Patient at Risk for Falls Due to   No Fall Risks No Fall Risks No Fall Risks  Fall risk Follow up   Falls evaluation completed Falls evaluation completed Falls evaluation completed   Functional Status Survey:    Vitals:   04/26/22 1251  BP: 116/60  Pulse: 83  Resp: 18  Temp: (!) 97.3 F (36.3 C)  TempSrc: Tympanic  SpO2: 98%  Weight: 152 lb (68.9 kg)  Height: 4' 10"  (1.473 m)   Body mass index is 31.77 kg/m. Physical Exam Vitals reviewed.  Constitutional:      General: She is not in acute distress.    Appearance: Normal appearance. She is obese. She is not ill-appearing or diaphoretic.  HENT:     Head: Normocephalic.     Nose: Rhinorrhea present. No congestion.     Mouth/Throat:     Mouth: Mucous membranes are moist.  Pharynx: Oropharynx is clear. No oropharyngeal exudate or posterior oropharyngeal erythema.  Eyes:     General: No scleral icterus.       Right eye: No discharge.        Left eye: No discharge.     Conjunctiva/sclera: Conjunctivae normal.     Pupils: Pupils are equal, round, and reactive to light.  Neck:     Vascular: No carotid bruit.  Cardiovascular:     Rate and Rhythm: Normal rate and regular rhythm.     Pulses: Normal pulses.     Heart sounds: Normal heart sounds. No murmur heard.    No friction rub. No gallop.  Pulmonary:     Effort: Pulmonary effort is normal. No respiratory distress.     Breath sounds: Normal breath sounds. No wheezing, rhonchi or rales.  Chest:     Chest wall: No tenderness.  Abdominal:     General: Bowel sounds are normal. There is no distension.     Palpations: Abdomen is soft. There is no mass.      Tenderness: There is no abdominal tenderness. There is no right CVA tenderness, left CVA tenderness, guarding or rebound.  Musculoskeletal:        General: No swelling or tenderness. Normal range of motion.     Cervical back: Normal range of motion. No rigidity or tenderness.     Right lower leg: No edema.     Left lower leg: No edema.  Lymphadenopathy:     Cervical: No cervical adenopathy.  Skin:    General: Skin is warm and dry.     Coloration: Skin is not pale.     Findings: Rash present. No bruising, erythema or lesion.     Comments: Raised red blanchable rash noted on bilateral upper,lower extremities,Abdomen and lower back   Neurological:     Mental Status: She is alert and oriented to person, place, and time.     Motor: No weakness.     Gait: Gait normal.  Psychiatric:        Mood and Affect: Mood normal.        Speech: Speech normal.        Behavior: Behavior normal.     Labs reviewed: Recent Labs    08/25/21 0819 03/01/22 0814  NA 140 142  K 4.3 4.3  CL 105 108  CO2 30 26  GLUCOSE 78 80  BUN 16 17  CREATININE 0.66 0.65  CALCIUM 10.2 10.3   Recent Labs    08/25/21 0819 03/01/22 0814  AST 14 14  ALT 9 8  BILITOT 0.7 0.5  PROT 6.2 6.1   Recent Labs    08/25/21 0819 03/01/22 0814  WBC 5.9 4.4  NEUTROABS 3,947 2,781  HGB 12.6 12.7  HCT 36.8 37.2  MCV 95.6 92.8  PLT 270 282   No results found for: "TSH" No results found for: "HGBA1C" Lab Results  Component Value Date   CHOL 160 03/01/2022   HDL 57 03/01/2022   LDLCALC 80 03/01/2022   TRIG 134 03/01/2022   CHOLHDL 2.8 03/01/2022    Significant Diagnostic Results in last 30 days:  No results found.  Assessment/Plan  1. Rash due to allergy Afebrile  Raised red blanchable rash noted on bilateral upper,lower extremities,Abdomen and lower back  Suspect possible allergic reaction from recent Amoxicillin  - start on tapered prednisone as below - Notify provider if symptoms worsen or develop  any shortness of breath or wheezing  - predniSONE (DELTASONE)  20 MG tablet; Take 2 tablets (40 mg total) by mouth daily with breakfast for 1 day, THEN 1.5 tablets (30 mg total) daily with breakfast for 1 day, THEN 1 tablet (20 mg total) daily with breakfast for 1 day, THEN 0.5 tablets (10 mg total) daily with breakfast for 1 day.  Dispense: 5 tablet; Refill: 0 - loratadine (CLARITIN) 10 MG tablet; Take 1 tablet (10 mg total) by mouth daily.  Dispense: 30 tablet; Refill: 11  2. Runny nose Afebrile  Loratadine daily as below  - Notify provider if symptoms worsen  - loratadine (CLARITIN) 10 MG tablet; Take 1 tablet (10 mg total) by mouth daily.  Dispense: 30 tablet; Refill: 11    Family/ staff Communication: Reviewed plan of care with patient verbalized understanding   Labs/tests ordered: None   Next Appointment:   Sandrea Hughs, NP

## 2022-04-26 NOTE — Patient Instructions (Signed)
-   Notify provider for any worsening symptoms.

## 2022-06-07 LAB — HM MAMMOGRAPHY

## 2022-06-07 LAB — HM DEXA SCAN

## 2022-06-16 ENCOUNTER — Telehealth: Payer: Medicare Other | Admitting: Adult Health

## 2022-06-17 ENCOUNTER — Encounter: Payer: Self-pay | Admitting: Adult Health

## 2022-06-17 ENCOUNTER — Telehealth (INDEPENDENT_AMBULATORY_CARE_PROVIDER_SITE_OTHER): Payer: Medicare Other | Admitting: Adult Health

## 2022-06-17 DIAGNOSIS — M810 Age-related osteoporosis without current pathological fracture: Secondary | ICD-10-CM | POA: Diagnosis not present

## 2022-06-17 MED ORDER — OYSTER SHELL CALCIUM/D3 500-5 MG-MCG PO TABS: 1.0000 | ORAL_TABLET | Freq: Two times a day (BID) | ORAL | 11 refills | Status: AC

## 2022-06-17 MED ORDER — ALENDRONATE SODIUM 70 MG PO TABS: 70.0000 mg | ORAL_TABLET | ORAL | 11 refills | Status: DC

## 2022-06-17 NOTE — Patient Instructions (Signed)
Osteoporosis  Osteoporosis is when the bones get thin and weak. This can cause your bones to break (fracture) more easily. What are the causes? The exact cause of this condition is not known. What increases the risk? Having family members with this condition. Not eating enough healthy foods. Taking certain medicines. Being female. Being age 83 or older. Smoking or using other products that contain nicotine or tobacco, such as e-cigarettes or chewing tobacco. Not exercising. Being of European or Asian ancestry. Having a small body frame. What are the signs or symptoms? A broken bone might be the first sign, especially if the break results from a fall or injury that usually would not cause a bone to break. Other signs and symptoms include: Pain in the neck or low back. Being hunched over (stooped posture). Getting shorter. How is this treated? Eating more foods with more calcium and vitamin D in them. Doing exercises. Stopping tobacco use. Limiting how much alcohol you drink. Taking medicines to slow bone loss or help make the bones stronger. Taking supplements of calcium and vitamin D every day. Taking medicines to replace chemicals in the body (hormone replacement medicines). Monitoring your levels of calcium and vitamin D. The goal of treatment is to strengthen your bones and lower your risk for a bone break. Follow these instructions at home: Eating and drinking Eat plenty of calcium and vitamin D. These nutrients are good for your bones. Good sources of calcium and vitamin D include: Some fish, such as salmon and tuna. Foods that have calcium and vitamin D added to them (fortified foods), such as some breakfast cereals. Egg yolks. Cheese. Liver.  Activity Do exercises as told by your doctor. Ask your doctor what exercises are safe for you. You should do: Exercises that make your muscles work to hold your body weight up (weight-bearing exercises). These include tai chi,  yoga, and walking. Exercises to make your muscles stronger. One example is lifting weights. Lifestyle Do not drink alcohol if: Your doctor tells you not to drink. You are pregnant, may be pregnant, or are planning to become pregnant. If you drink alcohol: Limit how much you use to: 0-1 drink a day for women. 0-2 drinks a day for men. Know how much alcohol is in your drink. In the [place] one drink equals one 12 oz bottle of beer (355 mL), one 5 oz glass of wine (148 mL), or one 1 oz glass of hard liquor (44 mL). Do not smoke or use any products that contain nicotine or tobacco. If you need help quitting, ask your doctor. Preventing falls Use tools to help you move around (mobility aids) as needed. These include canes, walkers, scooters, and crutches. Keep rooms well-lit. Put away things on the floor that could make you trip. These include cords and rugs. Install safety rails on stairs. Install grab bars in bathrooms. Use rubber mats in slippery areas, like bathrooms. Wear shoes that: Fit you well. Support your feet. Have closed toes. Have rubber soles or low heels. Tell your doctor about all of the medicines you are taking. Some medicines can make you more likely to fall. General instructions Take over-the-counter and prescription medicines only as told by your doctor. Keep all follow-up visits. Contact a doctor if: You have not been tested (screened) for osteoporosis and you are: A woman who is age 74 or older. A man who is age 35 or older. Get help right away if: You fall. You get hurt. Summary Osteoporosis happens when your  bones get thin and weak. Weak bones can break (fracture) more easily. Eat plenty of calcium and vitamin D. These are good for your bones. Tell your doctor about all of the medicines that you take. This information is not intended to replace advice given to you by your health care provider. Make sure you discuss any questions you have with your health care  provider. Document Revised: 02/13/2020 Document Reviewed: 02/13/2020 Elsevier Patient Education  2023 Elsevier Inc.  

## 2022-06-17 NOTE — Progress Notes (Signed)
This service is provided via telemedicine  No vital signs collected/recorded due to the encounter was a telemedicine visit.   Location of patient (ex: home, work):  home  Patient consents to a telephone visit:  yes  Location of the provider (ex: office, home):  [facility] Names of all persons participating in the telemedicine service and their role in the encounter:  patient, Earl Gala Palmer, Durenda Age, NP   Time spent on call:  10 minutes  Ms. Mcginnis,you are scheduled for a virtual visit with your provider today.    Just as we do with appointments in the office, we must obtain your consent to participate.  Your consent will be active for this visit and any virtual visit you may have with one of our providers in the next 365 days.    If you have a MyChart account, I can also send a copy of this consent to you electronically.  All virtual visits are billed to your insurance company just like a traditional visit in the office.  As this is a virtual visit, video technology does not allow for your provider to perform a traditional examination.  This may limit your provider's ability to fully assess your condition.  If your provider identifies any concerns that need to be evaluated in person or the need to arrange testing such as labs, EKG, etc, we will make arrangements to do so.    Although advances in technology are sophisticated, we cannot ensure that it will always work on either your end or our end.  If the connection with a video visit is poor, we may have to switch to a telephone visit.  With either a video or telephone visit, we are not always able to ensure that we have a secure connection.   I need to obtain your verbal consent now.   Are you willing to proceed with your visit today?   Amanada Philbrick has provided verbal consent on 06/17/2022 for a virtual visit (video or telephone).   Debe Coder, [place] 06/17/2022  9:05  AM      DATE:  06/17/2022 MRN:  [id number]  BIRTHDAY: 09-18-1940   Contact Information     Name Relation Home Work [place] Spouse [phone]          Code Status History     Date Active Date Inactive Code Status Order ID Comments User Context   02/08/2018 1303 02/19/2018 1352 DNR [id number]  Mariel Aloe, MD Inpatient   08/07/2017 1123 02/08/2018 0331 DNR [id number]  [name], NP Outpatient        Chief Complaint  Patient presents with   Acute Visit    Patient is here to discuss bone density results via video visit    HISTORY OF PRESENT ILLNESS:This is an 81 year old female who had a video visit to discuss her bone density result. Bone density T-score of -2.7 which is ranging in osteoporosis. She has GFR 88, calcium 10.3. She takes Vitamin D3 but not on calcium supplementation. She denies difficulty swallowing. She denies having history of fracture nor breast cancer. She had a recent mammogram which is  negative for malignancy.     PAST MEDICAL HISTORY:  Past Medical History:  Diagnosis Date   Cancer of skin of scalp    2023   Disorder of bone and cartilage, unspecified    Disorders of bursae and tendons in shoulder region, unspecified    Encounter for long-term (  current) use of other medications    Esophageal reflux    Hyperosmolality and/or hypernatremia    Osteoporosis, unspecified    Other and unspecified hyperlipidemia    Pain in joint, shoulder region    Pain in limb    Palpitations    Pure hypercholesterolemia    Reflux esophagitis    Tachycardia, unspecified    Unspecified essential hypertension    Unspecified urinary incontinence      CURRENT MEDICATIONS: Reviewed  Patient's Medications  New Prescriptions   No medications on file  Previous Medications   ACETAMINOPHEN (TYLENOL) 650 MG CR TABLET    Take 650 mg by mouth as needed.    ASPIRIN EC 81 MG TABLET    Take 81 mg by mouth daily.   ATENOLOL (TENORMIN) 25 MG  TABLET    TAKE 1 TABLET BY MOUTH EVERYDAY AT BEDTIME   BIOTIN 5000 MCG CAPS    Take by mouth daily.   CHOLECALCIFEROL (VITAMIN D) 2000 UNITS TABLET    Take 2,000 Units by mouth daily.    FERROUS SULFATE (IRON) 325 (65 FE) MG TABS    Take 1 tablet by mouth every other day.   LORATADINE (CLARITIN) 10 MG TABLET    Take 1 tablet (10 mg total) by mouth daily.   PANTOPRAZOLE (PROTONIX) 40 MG TABLET    TAKE 1 TABLET BY MOUTH EVERY DAY   SIMVASTATIN (ZOCOR) 40 MG TABLET    TAKE 1 TABLET BY MOUTH DAILY AT 6:00PM  Modified Medications   No medications on file  Discontinued Medications   No medications on file     Allergies  Allergen Reactions   Shellfish Allergy Nausea And Vomiting     REVIEW OF SYSTEMS:  GENERAL: no change in appetite, no fatigue, no weight changes, no fever, chills or weakness SKIN: Denies rash, itching, wounds, ulcer sores, or nail abnormality EYES: Denies change in vision, dry eyes, eye pain, itching or discharge EARS: Denies change in hearing, ringing in ears, or earache NOSE: Denies nasal congestion or epistaxis MOUTH and THROAT: Denies oral discomfort, gingival pain or bleeding, pain from teeth or hoarseness   RESPIRATORY: no cough, SOB, DOE, wheezing, hemoptysis CARDIAC: no chest pain, edema or palpitations GI: no abdominal pain, diarrhea, constipation, heart burn, nausea or vomiting GU: Denies dysuria, frequency, hematuria, incontinence, or discharge MUSCULOSKELETAL: Denies joit pain, muscle pain, back pain, restricted movement, or unusual weakness CIRCULATION: Denies claudication, edema of legs, varicosities, or cold extremities NEUROLOGICAL: Denies dizziness, syncope, numbness, or headache PSYCHIATRIC: Denies feeling of depression or anxiety. No report of hallucinations, insomnia, paranoia, or agitation    LABS/RADIOLOGY: Labs reviewed: Basic Metabolic Panel: Recent Labs    08/25/21 0819 03/01/22 0814  NA 140 142  K 4.3 4.3  CL 105 108  CO2 30 26   GLUCOSE 78 80  BUN 16 17  CREATININE 0.66 0.65  CALCIUM 10.2 10.3   Liver Function Tests: Recent Labs    08/25/21 0819 03/01/22 0814  AST 14 14  ALT 9 8  BILITOT 0.7 0.5  PROT 6.2 6.1   No results for input(s): "LIPASE", "AMYLASE" in the last 8760 hours. No results for input(s): "AMMONIA" in the last 8760 hours. CBC: Recent Labs    08/25/21 0819 03/01/22 0814  WBC 5.9 4.4  NEUTROABS 3,947 2,781  HGB 12.6 12.7  HCT 36.8 37.2  MCV 95.6 92.8  PLT 270 282   A1C: Invalid input(s): "A1C" Lipid Panel: Recent Labs    03/01/22 0814  HDL  57   Cardiac Enzymes: No results for input(s): "CKTOTAL", "CKMB", "CKMBINDEX", "TROPONINI" in the last 8760 hours. BNP: Invalid input(s): "POCBNP" CBG: No results for input(s): "GLUCAP" in the last 8760 hours.    No results found.  ASSESSMENT/PLAN:  1. Age-related osteoporosis without current pathological fracture -  Bone density score -2.7 -  she has no difficulty swallowing, - alendronate (FOSAMAX) 70 MG tablet; Take 1 tablet (70 mg total) by mouth every 7 (seven) days. Take with a full glass of water on an empty stomach.  Dispense: 4 tablet; Refill: 11 - calcium-vitamin D (OSCAL WITH D) 500-5 MG-MCG tablet; Take 1 tablet by mouth 2 (two) times daily.  Dispense: 60 tablet; Refill: 11     Time spent on non face to face visit:  11  The patient gave consent to this telephone visit. Explained to the patient the risk and privacy issue that was involved with this telephone call.   The patient was advised to call back and ask for an in-person evaluation if the symptoms worsen or if the condition fails to improve.   Durenda Age, NP [facility] [phone]

## 2022-07-06 ENCOUNTER — Ambulatory Visit (INDEPENDENT_AMBULATORY_CARE_PROVIDER_SITE_OTHER): Payer: Medicare Other | Admitting: *Deleted

## 2022-07-06 DIAGNOSIS — Z23 Encounter for immunization: Secondary | ICD-10-CM | POA: Diagnosis not present

## 2022-08-22 ENCOUNTER — Other Ambulatory Visit: Payer: Self-pay | Admitting: Nurse Practitioner

## 2022-08-22 DIAGNOSIS — E785 Hyperlipidemia, unspecified: Secondary | ICD-10-CM

## 2022-08-25 ENCOUNTER — Other Ambulatory Visit: Payer: Self-pay | Admitting: Nurse Practitioner

## 2022-08-25 DIAGNOSIS — I119 Hypertensive heart disease without heart failure: Secondary | ICD-10-CM

## 2022-08-31 ENCOUNTER — Telehealth: Payer: Self-pay

## 2022-08-31 ENCOUNTER — Ambulatory Visit (INDEPENDENT_AMBULATORY_CARE_PROVIDER_SITE_OTHER): Payer: Medicare Other | Admitting: Nurse Practitioner

## 2022-08-31 ENCOUNTER — Encounter: Payer: Self-pay | Admitting: Nurse Practitioner

## 2022-08-31 DIAGNOSIS — Z0000 Encounter for general adult medical examination without abnormal findings: Secondary | ICD-10-CM | POA: Diagnosis not present

## 2022-08-31 NOTE — Progress Notes (Signed)
Subjective:   Brooke Cox is a 81 y.o. female who presents for Medicare Annual (Subsequent) preventive examination.  Review of Systems     Cardiac Risk Factors include: advanced age (>34mn, >>29women);hypertension;dyslipidemia;obesity (BMI >30kg/m2)     Objective:    Today's Vitals   08/31/22 1500  PainSc: 4    There is no height or weight on file to calculate BMI.     08/31/2022    2:21 PM 08/31/2021    2:38 PM 08/27/2021    8:38 AM 02/24/2021    9:40 AM 08/26/2020    8:23 AM 08/24/2020   10:52 AM 05/07/2020    9:34 AM  Advanced Directives  Does Patient Have a Medical Advance Directive? Yes Yes Yes Yes Yes Yes No  Type of Advance Directive Out of facility DNR (pink MOST or yellow form) Out of facility DNR (pink MOST or yellow form) Out of facility DNR (pink MOST or yellow form) Out of facility DNR (pink MOST or yellow form) Out of facility DNR (pink MOST or yellow form) Out of facility DNR (pink MOST or yellow form)   Does patient want to make changes to medical advance directive? No - Patient declined No - Patient declined No - Patient declined No - Patient declined No - Patient declined No - Patient declined No - Patient declined  Pre-existing out of facility DNR order (yellow form or pink MOST form) Yellow form placed in chart (order not valid for inpatient use);Pink MOST form placed in chart (order not valid for inpatient use) Pink MOST form placed in chart (order not valid for inpatient use);Yellow form placed in chart (order not valid for inpatient use) Yellow form placed in chart (order not valid for inpatient use) Yellow form placed in chart (order not valid for inpatient use) Yellow form placed in chart (order not valid for inpatient use);Pink MOST form placed in chart (order not valid for inpatient use)      Current Medications (verified) Outpatient Encounter Medications as of 08/31/2022  Medication Sig   acetaminophen (TYLENOL) 650 MG CR tablet Take 650 mg by  mouth as needed.    alendronate (FOSAMAX) 70 MG tablet Take 1 tablet (70 mg total) by mouth every 7 (seven) days. Take with a full glass of water on an empty stomach.   aspirin EC 81 MG tablet Take 81 mg by mouth daily.   atenolol (TENORMIN) 25 MG tablet TAKE 1 TABLET BY MOUTH EVERYDAY AT BEDTIME   Biotin 5000 MCG CAPS Take by mouth daily.   calcium-vitamin D (OSCAL WITH D) 500-5 MG-MCG tablet Take 1 tablet by mouth 2 (two) times daily.   Cholecalciferol (VITAMIN D) 2000 UNITS tablet Take 2,000 Units by mouth daily.    Ferrous Sulfate (IRON) 325 (65 Fe) MG TABS Take 1 tablet by mouth as directed. On Monday's and Friday's   pantoprazole (PROTONIX) 40 MG tablet TAKE 1 TABLET BY MOUTH EVERY DAY   simvastatin (ZOCOR) 40 MG tablet TAKE 1 TABLET BY MOUTH DAILY AT 6:00PM   [DISCONTINUED] loratadine (CLARITIN) 10 MG tablet Take 1 tablet (10 mg total) by mouth daily. (Patient not taking: Reported on 08/31/2022)   No facility-administered encounter medications on file as of 08/31/2022.    Allergies (verified) Shellfish allergy   History: Past Medical History:  Diagnosis Date   Cancer of skin of scalp    2023   Disorder of bone and cartilage, unspecified    Disorders of bursae and tendons in shoulder region, unspecified  Encounter for long-term (current) use of other medications    Esophageal reflux    Hyperosmolality and/or hypernatremia    Osteoporosis, unspecified    Other and unspecified hyperlipidemia    Pain in joint, shoulder region    Pain in limb    Palpitations    Pure hypercholesterolemia    Reflux esophagitis    Tachycardia, unspecified    Unspecified essential hypertension    Unspecified urinary incontinence    Past Surgical History:  Procedure Laterality Date   APPENDECTOMY  09/12/1953   Bone Density  08/14/2012   CATARACT EXTRACTION Bilateral 09/12/2008   CHOLECYSTECTOMY  09/12/2002   COLONOSCOPY  03/31/2009   Diverticulosis, Dr.John Amedeo Plenty    DIAGNOSTIC MAMMOGRAM      LAPAROSCOPY N/A 02/15/2018   Procedure: LAPAROSCOPY DIAGNOSTIC, LYSIS OF ADHESION;  Surgeon: Ralene Ok, MD;  Location: [facility];  Service: General;  Laterality: N/A;   VARICOSE VEIN SURGERY Left    Summer 2022, Dr.Featherson ([place])   Family History  Problem Relation Age of Onset   Hypertension Mother    Diabetes Brother    Crohn's disease Neg Hx    Inflammatory bowel disease Neg Hx    Social History   Socioeconomic History   Marital status: Married    Spouse name: Not on file   Number of children: Not on file   Years of education: Not on file   Highest education level: Not on file  Occupational History   Occupation: retired  Tobacco Use   Smoking status: Never   Smokeless tobacco: Never  Vaping Use   Vaping Use: Never used  Substance and Sexual Activity   Alcohol use: No   Drug use: No   Sexual activity: Not Currently  Other Topics Concern   Not on file  Social History Narrative   Not on file   Social Determinants of Health   Financial Resource Strain: [place]  (08/07/2017)   Overall Financial Resource Strain (CARDIA)    Difficulty of Paying Living Expenses: Not hard at all  Food Insecurity: No Food Insecurity (08/07/2017)   Hunger Vital Sign    Worried About Running Out of Food in the Last Year: Never true    [place] in the Last Year: Never true  Transportation Needs: No Transportation Needs (08/07/2017)   PRAPARE - Hydrologist (Medical): No    Lack of Transportation (Non-Medical): No  Physical Activity: Insufficiently Active (08/20/2018)   Exercise Vital Sign    Days of Exercise per Week: 3 days    Minutes of Exercise per Session: 40 min  Stress: No Stress Concern Present (08/07/2017)   [place]    Feeling of Stress : Only a little  Social Connections: Socially Integrated (08/07/2017)   Social Connection and Isolation Panel [NHANES]     Frequency of Communication with Friends and Family: More than three times a week    Frequency of Social Gatherings with Friends and Family: More than three times a week    Attends Religious Services: 1 to 4 times per year    Active Member of [employer] or Organizations: Yes    Attends Archivist Meetings: 1 to 4 times per year    Marital Status: Married    Tobacco Counseling Counseling given: Not Answered   Clinical Intake:  Pre-visit preparation completed: Yes  Pain : 0-10 Pain Score: 4  Pain Type: Chronic pain Pain Location: Leg  Pain Orientation: Left     BMI - recorded: 31 Nutritional Status: BMI > 30  Obese Diabetes: No  How often do you need to have someone help you when you read instructions, pamphlets, or other written materials from your doctor or pharmacy?: 1 - Never  [place] of Daily Living    08/31/2022    2:58 PM  In your present state of health, do you have any difficulty performing the following activities:  Hearing? 0  Vision? 0  Difficulty concentrating or making decisions? 0  Walking or climbing stairs? 1  Comment sometimes has touble going up steps  Dressing or bathing? 0  Doing errands, shopping? 0  Preparing Food and eating ? N  Using the Toilet? N  In the past six months, have you accidently leaked urine? Y  Do you have problems with loss of bowel control? N  Managing your Medications? N  Managing your Finances? N  Housekeeping or managing your Housekeeping? N    Patient Care Team: [name], NP as PCP - General (Geriatric Medicine) Renard Hamper Marguerita Merles (Physician Assistant) Clent Jacks, MD as Consulting Physician (Ophthalmology) Rozetta Nunnery, MD (Inactive) as Consulting Physician (Otolaryngology)  Indicate any recent Medical Services you may have received from other than [facility] providers in the past year (date may be approximate).     Assessment:   This is a routine wellness  examination for Brooke Cox.  Hearing/Vision screen Hearing Screening - Comments:: No hearing issues Vision Screening - Comments:: Last eye exam greater than 12 months ago, Dr.[facility]   Dietary issues and exercise activities discussed: Current Exercise Habits: The patient does not participate in regular exercise at present   Goals Addressed   None   Depression Screen    08/31/2022    2:20 PM 08/31/2021    2:35 PM 08/27/2021    8:37 AM 08/24/2020   10:51 AM 08/23/2019   10:11 AM 02/12/2019    8:18 AM 08/20/2018    8:52 AM  PHQ 2/9 Scores  PHQ - 2 Score 0 0 0 0 0 0 0    Fall Risk    08/31/2022    2:20 PM 03/04/2022    8:08 AM 08/31/2021    2:36 PM 08/27/2021    8:37 AM 08/24/2020   10:51 AM  Fall Risk   Falls in the past year? 0 0 0 0 0  Number falls in past yr: 0 0 0 0 0  Injury with Fall? 0 0 1 0 0  Risk for fall due to : No Fall Risks No Fall Risks No Fall Risks No Fall Risks   Follow up Falls evaluation completed Falls evaluation completed Falls evaluation completed Falls evaluation completed     [place]:  Any stairs in or around the home? Yes  If so, are there any without handrails? No  Home free of loose throw rugs in walkways, pet beds, electrical cords, etc? Yes  Adequate lighting in your home to reduce risk of falls? Yes   ASSISTIVE DEVICES UTILIZED TO PREVENT FALLS:  Life alert? No  Use of a cane, walker or w/c? No  Grab bars in the bathroom? Yes  Shower chair or bench in shower? No  Elevated toilet seat or a handicapped toilet? No   TIMED UP AND GO:  Was the test performed? No .    Cognitive Function:    08/20/2018  8:58 AM 08/07/2017   10:14 AM 08/01/2016   10:21 AM 07/30/2015    9:03 AM 07/23/2014   11:42 AM  MMSE - Mini Mental State Exam  Orientation to time 5 5 5 5 5   Orientation to Place 5 5 5 5 5   Registration 3 3 3 3 3   Attention/ Calculation 5 5 5 5 5   Recall 3 2 3 3 3   Language- name 2 objects 2 2 2  2 2   Language- repeat 1 1 1 1 1   Language- follow 3 step command 3 3 3 3 3   Language- read & follow direction 1 1 1 1 1   Write a sentence 1 1 1 1 1   Copy design 1 1 1 1 1   Total score 30 29 30 30 30         08/31/2022    2:22 PM 08/31/2021    2:38 PM 08/24/2020   10:53 AM 08/23/2019   10:11 AM  6CIT Screen  What Year? 0 points 0 points 0 points 0 points  What month? 0 points 0 points 0 points 0 points  What time? 0 points 0 points 0 points 0 points  Count back from 20 0 points 0 points 0 points 0 points  Months in reverse 0 points 0 points 0 points 0 points  Repeat phrase 0 points 0 points 0 points 2 points  Total Score 0 points 0 points 0 points 2 points    Immunizations Immunization History  Administered Date(s) Administered   Fluad Quad(high Dose 65+) 07/09/2019, 06/10/2020, 07/01/2021, 07/06/2022   Influenza, High Dose Seasonal PF 07/12/2017, 06/22/2018   Influenza,inj,Quad PF,6+ Mos 07/03/2013, 07/30/2015, 08/01/2016   Influenza-Unspecified 07/03/2012, 06/12/2014   PFIZER Comirnaty(Gray Top)Covid-19 Tri-Sucrose Vaccine 12/21/2020   PFIZER(Purple Top)SARS-COV-2 Vaccination 10/02/2019, 10/20/2019, 08/12/2020   Pneumococcal Conjugate-13 07/23/2014   Pneumococcal Polysaccharide-23 02/20/2008   Tdap 07/05/2011   Zoster Recombinat (Shingrix) 09/06/2018, 03/27/2019   Zoster, Live 07/28/2011    TDAP status: Due, Education has been provided regarding the importance of this vaccine. Advised may receive this vaccine at local pharmacy or Health Dept. Aware to provide a copy of the vaccination record if obtained from local pharmacy or Health Dept. Verbalized acceptance and understanding.  Flu Vaccine status: Up to date  Pneumococcal vaccine status: Up to date  Covid-19 vaccine status: Information provided on how to obtain vaccines.   Qualifies for Shingles Vaccine? Yes   Zostavax completed No   Shingrix Completed?: Yes  Screening Tests Health Maintenance  Topic Date Due    DTaP/Tdap/Td (2 - Td or Tdap) 07/04/2021   COVID-19 Vaccine (5 - 2023-24 season) 05/13/2022   MAMMOGRAM  06/08/2023   Medicare Annual Wellness (AWV)  09/01/2023   Pneumonia Vaccine 29+ Years old  Completed   INFLUENZA VACCINE  Completed   DEXA SCAN  Completed   Zoster Vaccines- Shingrix  Completed   HPV VACCINES  Aged Out    Health Maintenance  Health Maintenance Due  Topic Date Due   DTaP/Tdap/Td (2 - Td or Tdap) 07/04/2021   COVID-19 Vaccine (5 - 2023-24 season) 05/13/2022    Colorectal cancer screening: No longer required.   Mammogram status: No longer required due to age out.  Bone Density status: Completed 2023. Results reflect: Bone density results: OSTEOPOROSIS. Repeat every 2 years.  Lung Cancer Screening: (Low Dose CT Chest recommended if Age 88-80 years, 30 pack-year currently smoking OR have quit w/in 15years.) does not qualify.   Lung Cancer Screening Referral: na  Additional Screening:  Hepatitis C Screening: does not qualify; Completed   Vision Screening: Recommended annual ophthalmology exams for early detection of glaucoma and other disorders of the eye. Is the patient up to date with their annual eye exam?  Yes  Who is the provider or what is the name of the office in which the patient attends annual eye exams? [facility] If pt is not established with a provider, would they like to be referred to a provider to establish care? No .   Dental Screening: Recommended annual dental exams for proper oral hygiene  Community Resource Referral / Chronic Care Management: CRR required this visit?  No   CCM required this visit?  No      Plan:     I have personally reviewed and noted the following in the patient's chart:   Medical and social history Use of alcohol, tobacco or illicit drugs  Current medications and supplements including opioid prescriptions. Patient is not currently taking opioid prescriptions. Functional ability and status Nutritional  status Physical activity Advanced directives List of other physicians Hospitalizations, surgeries, and ER visits in previous 12 months Vitals Screenings to include cognitive, depression, and falls Referrals and appointments  In addition, I have reviewed and discussed with patient certain preventive protocols, quality metrics, and best practice recommendations. A written personalized care plan for preventive services as well as general preventive health recommendations were provided to patient.     [name], NP   08/31/2022    Virtual Visit via Telephone Note  I connected with patient 08/31/22 at  2:20 PM EST by telephone and verified that I am speaking with the correct person using two identifiers.  Location: Patient: home Provider: [facility]    I discussed the limitations, risks, security and privacy concerns of performing an evaluation and management service by telephone and the availability of in person appointments. I also discussed with the patient that there may be a patient responsible charge related to this service. The patient expressed understanding and agreed to proceed.   I discussed the assessment and treatment plan with the patient. The patient was provided an opportunity to ask questions and all were answered. The patient agreed with the plan and demonstrated an understanding of the instructions.   The patient was advised to call back or seek an in-person evaluation if the symptoms worsen or if the condition fails to improve as anticipated.  I provided 12 minutes of non-face-to-face time during this encounter.  [employer]. Harle Battiest Avs printed and mailed

## 2022-08-31 NOTE — Patient Instructions (Signed)
Brooke Cox , Thank you for taking time to come for your Medicare Wellness Visit. I appreciate your ongoing commitment to your health goals. Please review the following plan we discussed and let me know if I can assist you in the future.   Screening recommendations/referrals: Colonoscopy aged out Mammogram aged out Bone Density up to date Recommended yearly ophthalmology/optometry visit for glaucoma screening and checkup Recommended yearly dental visit for hygiene and checkup  Vaccinations: Influenza vaccine- due annually in September/October Pneumococcal vaccine up to date Tdap vaccine DUE- recommend to get at your local pharmacy Shingles vaccine up to date    Advanced directives: on file.   Conditions/risks identified: advanced age.   Next appointment: yearly   Preventive Care 83 Years and Older, Female Preventive care refers to lifestyle choices and visits with your health care provider that can promote health and wellness. What does preventive care include? A yearly physical exam. This is also called an annual well check. Dental exams once or twice a year. Routine eye exams. Ask your health care provider how often you should have your eyes checked. Personal lifestyle choices, including: Daily care of your teeth and gums. Regular physical activity. Eating a healthy diet. Avoiding tobacco and drug use. Limiting alcohol use. Practicing safe sex. Taking low-dose aspirin every day. Taking vitamin and mineral supplements as recommended by your health care provider. What happens during an annual well check? The services and screenings done by your health care provider during your annual well check will depend on your age, overall health, lifestyle risk factors, and family history of disease. Counseling  Your health care provider may ask you questions about your: Alcohol use. Tobacco use. Drug use. Emotional well-being. Home and relationship well-being. Sexual  activity. Eating habits. History of falls. Memory and ability to understand (cognition). Work and work Statistician. Reproductive health. Screening  You may have the following tests or measurements: Height, weight, and BMI. Blood pressure. Lipid and cholesterol levels. These may be checked every 5 years, or more frequently if you are over 16 years old. Skin check. Lung cancer screening. You may have this screening every year starting at age 58 if you have a 30-pack-year history of smoking and currently smoke or have quit within the past 15 years. Fecal occult blood test (FOBT) of the stool. You may have this test every year starting at age 62. Flexible sigmoidoscopy or colonoscopy. You may have a sigmoidoscopy every 5 years or a colonoscopy every 10 years starting at age 62. Hepatitis C blood test. Hepatitis B blood test. Sexually transmitted disease (STD) testing. Diabetes screening. This is done by checking your blood sugar (glucose) after you have not eaten for a while (fasting). You may have this done every 1-3 years. Bone density scan. This is done to screen for osteoporosis. You may have this done starting at age 68. Mammogram. This may be done every 1-2 years. Talk to your health care provider about how often you should have regular mammograms. Talk with your health care provider about your test results, treatment options, and if necessary, the need for more tests. Vaccines  Your health care provider may recommend certain vaccines, such as: Influenza vaccine. This is recommended every year. Tetanus, diphtheria, and acellular pertussis (Tdap, Td) vaccine. You may need a Td booster every 10 years. Zoster vaccine. You may need this after age 36. Pneumococcal 13-valent conjugate (PCV13) vaccine. One dose is recommended after age 60. Pneumococcal polysaccharide (PPSV23) vaccine. One dose is recommended after age 37. Talk  to your health care provider about which screenings and vaccines  you need and how often you need them. This information is not intended to replace advice given to you by your health care provider. Make sure you discuss any questions you have with your health care provider. Document Released: 09/25/2015 Document Revised: 05/18/2016 Document Reviewed: 06/30/2015 Elsevier Interactive Patient Education  2017  Prevention in the Home Falls can cause injuries. They can happen to people of all ages. There are many things you can do to make your home safe and to help prevent falls. What can I do on the outside of my home? Regularly fix the edges of walkways and driveways and fix any cracks. Remove anything that might make you trip as you walk through a door, such as a raised step or threshold. Trim any bushes or trees on the path to your home. Use bright outdoor lighting. Clear any walking paths of anything that might make someone trip, such as rocks or tools. Regularly check to see if handrails are loose or broken. Make sure that both sides of any steps have handrails. Any raised decks and porches should have guardrails on the edges. Have any leaves, snow, or ice cleared regularly. Use sand or salt on walking paths during winter. Clean up any spills in your garage right away. This includes oil or grease spills. What can I do in the bathroom? Use night lights. Install grab bars by the toilet and in the tub and shower. Do not use towel bars as grab bars. Use non-skid mats or decals in the tub or shower. If you need to sit down in the shower, use a plastic, non-slip stool. Keep the floor dry. Clean up any water that spills on the floor as soon as it happens. Remove soap buildup in the tub or shower regularly. Attach bath mats securely with double-sided non-slip rug tape. Do not have throw rugs and other things on the floor that can make you trip. What can I do in the bedroom? Use night lights. Make sure that you have a light by your bed that  is easy to reach. Do not use any sheets or blankets that are too big for your bed. They should not hang down onto the floor. Have a firm chair that has side arms. You can use this for support while you get dressed. Do not have throw rugs and other things on the floor that can make you trip. What can I do in the kitchen? Clean up any spills right away. Avoid walking on wet floors. Keep items that you use a lot in easy-to-reach places. If you need to reach something above you, use a strong step stool that has a grab bar. Keep electrical cords out of the way. Do not use floor polish or wax that makes floors slippery. If you must use wax, use non-skid floor wax. Do not have throw rugs and other things on the floor that can make you trip. What can I do with my stairs? Do not leave any items on the stairs. Make sure that there are handrails on both sides of the stairs and use them. Fix handrails that are broken or loose. Make sure that handrails are as long as the stairways. Check any carpeting to make sure that it is firmly attached to the stairs. Fix any carpet that is loose or worn. Avoid having throw rugs at the top or bottom of the stairs. If you do have throw rugs,  attach them to the floor with carpet tape. Make sure that you have a light switch at the top of the stairs and the bottom of the stairs. If you do not have them, ask someone to add them for you. What else can I do to help prevent falls? Wear shoes that: Do not have high heels. Have rubber bottoms. Are comfortable and fit you well. Are closed at the toe. Do not wear sandals. If you use a stepladder: Make sure that it is fully opened. Do not climb a closed stepladder. Make sure that both sides of the stepladder are locked into place. Ask someone to hold it for you, if possible. Clearly mark and make sure that you can see: Any grab bars or handrails. First and last steps. Where the edge of each step is. Use tools that help you  move around (mobility aids) if they are needed. These include: Canes. Walkers. Scooters. Crutches. Turn on the lights when you go into a dark area. Replace any light bulbs as soon as they burn out. Set up your furniture so you have a clear path. Avoid moving your furniture around. If any of your floors are uneven, fix them. If there are any pets around you, be aware of where they are. Review your medicines with your doctor. Some medicines can make you feel dizzy. This can increase your chance of falling. Ask your doctor what other things that you can do to help prevent falls. This information is not intended to replace advice given to you by your health care provider. Make sure you discuss any questions you have with your health care provider. Document Released: 06/25/2009 Document Revised: 02/04/2016 Document Reviewed: 10/03/2014 Elsevier Interactive Patient Education  2017 [employer].

## 2022-08-31 NOTE — Progress Notes (Signed)
   This service is provided via telemedicine  No vital signs collected/recorded due to the encounter was a telemedicine visit.   Location of patient (ex: home, work):  Home  Patient consents to a telephone visit: Yes, see telephone visit dated 08/31/22  Location of the provider (ex: office, home):  [facility], Office   Name of any referring provider:  N/A  Names of all persons participating in the telemedicine service and their role in the encounter:  Chrae/CMA, Sherrie Mustache, NP, and Patient   Time spent on call:  9 min with medical assistant

## 2022-08-31 NOTE — Telephone Encounter (Signed)
[name], alden are scheduled for a virtual visit with your provider today.    Just as we do with appointments in the office, we must obtain your consent to participate.  Your consent will be active for this visit and any virtual visit you may have with one of our providers in the next 365 days.    If you have a MyChart account, I can also send a copy of this consent to you electronically.  All virtual visits are billed to your insurance company just like a traditional visit in the office.  As this is a virtual visit, video technology does not allow for your provider to perform a traditional examination.  This may limit your provider's ability to fully assess your condition.  If your provider identifies any concerns that need to be evaluated in person or the need to arrange testing such as labs, EKG, etc, we will make arrangements to do so.    Although advances in technology are sophisticated, we cannot ensure that it will always work on either your end or our end.  If the connection with a video visit is poor, we may have to switch to a telephone visit.  With either a video or telephone visit, we are not always able to ensure that we have a secure connection.   I need to obtain your verbal consent now.   Are you willing to proceed with your visit today?   Brooke Cox has provided verbal consent on 08/31/2022 for a virtual visit (video or telephone).   Brooke Cox, Brooke Cox 08/31/2022  2:26 PM

## 2022-09-01 ENCOUNTER — Encounter: Payer: Medicare Other | Admitting: Nurse Practitioner

## 2022-09-01 ENCOUNTER — Other Ambulatory Visit: Payer: Self-pay | Admitting: Nurse Practitioner

## 2022-09-01 DIAGNOSIS — K219 Gastro-esophageal reflux disease without esophagitis: Secondary | ICD-10-CM

## 2022-09-02 ENCOUNTER — Other Ambulatory Visit: Payer: Self-pay

## 2022-09-02 ENCOUNTER — Encounter: Payer: Medicare Other | Admitting: Nurse Practitioner

## 2022-09-02 DIAGNOSIS — D509 Iron deficiency anemia, unspecified: Secondary | ICD-10-CM

## 2022-09-02 DIAGNOSIS — E785 Hyperlipidemia, unspecified: Secondary | ICD-10-CM

## 2022-09-06 ENCOUNTER — Other Ambulatory Visit: Payer: Medicare Other

## 2022-09-07 ENCOUNTER — Encounter: Payer: Self-pay | Admitting: Nurse Practitioner

## 2022-09-07 LAB — CBC WITH DIFFERENTIAL/PLATELET
Absolute Monocytes: 339 cells/uL (ref 200–950)
Basophils Absolute: 22 cells/uL (ref 0–200)
Basophils Relative: 0.5 %
Eosinophils Absolute: 189 cells/uL (ref 15–500)
Eosinophils Relative: 4.3 %
HCT: 35.3 % (ref 35.0–45.0)
Hemoglobin: 12.1 g/dL (ref 11.7–15.5)
Lymphs Abs: 1201 cells/uL (ref 850–3900)
MCH: 32.3 pg (ref 27.0–33.0)
MCHC: 34.3 g/dL (ref 32.0–36.0)
MCV: 94.1 fL (ref 80.0–100.0)
MPV: 9.2 fL (ref 7.5–12.5)
Monocytes Relative: 7.7 %
Neutro Abs: 2649 cells/uL (ref 1500–7800)
Neutrophils Relative %: 60.2 %
Platelets: 240 10*3/uL (ref 140–400)
RBC: 3.75 10*6/uL — ABNORMAL LOW (ref 3.80–5.10)
RDW: 11.8 % (ref 11.0–15.0)
Total Lymphocyte: 27.3 %
WBC: 4.4 10*3/uL (ref 3.8–10.8)

## 2022-09-07 LAB — COMPLETE METABOLIC PANEL WITH GFR
AG Ratio: 1.7 (calc) (ref 1.0–2.5)
ALT: 11 U/L (ref 6–29)
AST: 15 U/L (ref 10–35)
Albumin: 3.8 g/dL (ref 3.6–5.1)
Alkaline phosphatase (APISO): 55 U/L (ref 37–153)
BUN: 16 mg/dL (ref 7–25)
CO2: 23 mmol/L (ref 20–32)
Calcium: 10.2 mg/dL (ref 8.6–10.4)
Chloride: 107 mmol/L (ref 98–110)
Creat: 0.66 mg/dL (ref 0.60–0.95)
Globulin: 2.2 g/dL (calc) (ref 1.9–3.7)
Glucose, Bld: 79 mg/dL (ref 65–99)
Potassium: 4.3 mmol/L (ref 3.5–5.3)
Sodium: 142 mmol/L (ref 135–146)
Total Bilirubin: 0.5 mg/dL (ref 0.2–1.2)
Total Protein: 6 g/dL — ABNORMAL LOW (ref 6.1–8.1)
eGFR: 87 mL/min/{1.73_m2} (ref >=60)

## 2022-09-09 ENCOUNTER — Ambulatory Visit: Payer: Medicare Other | Admitting: Nurse Practitioner

## 2022-09-09 ENCOUNTER — Encounter: Payer: Self-pay | Admitting: Nurse Practitioner

## 2022-09-09 VITALS — BP 122/80 | HR 62 | Temp 97.9°F | Ht <= 58 in | Wt 152.0 lb

## 2022-09-09 DIAGNOSIS — E785 Hyperlipidemia, unspecified: Secondary | ICD-10-CM | POA: Diagnosis not present

## 2022-09-09 DIAGNOSIS — D509 Iron deficiency anemia, unspecified: Secondary | ICD-10-CM

## 2022-09-09 DIAGNOSIS — M810 Age-related osteoporosis without current pathological fracture: Secondary | ICD-10-CM | POA: Diagnosis not present

## 2022-09-09 DIAGNOSIS — I119 Hypertensive heart disease without heart failure: Secondary | ICD-10-CM | POA: Diagnosis not present

## 2022-09-09 DIAGNOSIS — I739 Peripheral vascular disease, unspecified: Secondary | ICD-10-CM

## 2022-09-09 DIAGNOSIS — K219 Gastro-esophageal reflux disease without esophagitis: Secondary | ICD-10-CM

## 2022-09-09 NOTE — Progress Notes (Signed)
Careteam: Patient Care Team: [name], NP as PCP - General (Geriatric Medicine) Renard Hamper, Marguerita Merles (Physician Assistant) Clent Jacks, MD as Consulting Physician (Ophthalmology) Rozetta Nunnery, MD (Inactive) as Consulting Physician (Otolaryngology)  PLACE OF SERVICE:  [place] Directive information Does Patient Have a Medical Advance Directive?: Yes, Type of Advance Directive: Out of facility DNR (pink MOST or yellow form), Pre-existing out of facility DNR order (yellow form or pink MOST form): Yellow form placed in chart (order not valid for inpatient use);Pink MOST form placed in chart (order not valid for inpatient use), Does patient want to make changes to medical advance directive?: No - Patient declined  Allergies  Allergen Reactions   Shellfish Allergy Nausea And Vomiting    Chief Complaint  Patient presents with   Medical Management of Chronic Issues    6 month follow-up and discuss labs (copy printed). Discuss need for additional covid boosters and td/tdap. [facility] verified. Patient denies receiving any vaccines since last visit. Patient c/o pain in left leg, no known injury.      HPI: Patient is a 81 y.o. female for routine follow up.   Osteoporosis- on fosamax, does not like taking it weekly- some GERD but minimal.  Also taking Cal and vit D   GERD- overall controlled.   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 She had [place] to rule out blood clot within the last 2 months.  Mostly has pain when walking- will improve with rest Sometimes when she is in bed will have pain but this is rare.  Unable to describe pain  Anemia- stable, continues on iron supplements.   Review of Systems:  Review of Systems  Constitutional:  Negative for chills, fever and weight  loss.  HENT:  Negative for tinnitus.   Respiratory:  Negative for cough, sputum production and shortness of breath.   Cardiovascular:  Negative for chest pain, palpitations and leg swelling.  Gastrointestinal:  Negative for abdominal pain, constipation, diarrhea and heartburn.  Genitourinary:  Negative for dysuria, frequency and urgency.  Musculoskeletal:  Positive for myalgias. Negative for back pain, falls and joint pain.  Skin: Negative.   Neurological:  Negative for dizziness and headaches.  Psychiatric/Behavioral:  Negative for depression and memory loss. The patient does not have insomnia.     Past Medical History:  Diagnosis Date   Cancer of skin of scalp    2023   Disorder of bone and cartilage, unspecified    Disorders of bursae and tendons in shoulder region, unspecified    Encounter for long-term (current) use of other medications    Esophageal reflux    Hyperosmolality and/or hypernatremia    Osteoporosis, unspecified    Other and unspecified hyperlipidemia    Pain in joint, shoulder region    Pain in limb    Palpitations    Pure hypercholesterolemia    Reflux esophagitis    Tachycardia, unspecified    Unspecified essential hypertension    Unspecified urinary incontinence    Past Surgical History:  Procedure Laterality Date   APPENDECTOMY  09/12/1953   Bone Density  08/14/2012   CATARACT EXTRACTION Bilateral 09/12/2008   CHOLECYSTECTOMY  09/12/2002   COLONOSCOPY  03/31/2009   Diverticulosis, Dr.John Amedeo Plenty    DIAGNOSTIC MAMMOGRAM  LAPAROSCOPY N/A 02/15/2018   Procedure: LAPAROSCOPY DIAGNOSTIC, LYSIS OF ADHESION;  Surgeon: Ralene Ok, MD;  Location: [facility];  Service: General;  Laterality: N/A;   VARICOSE VEIN SURGERY Left    Summer 2022, Dr.Featherson ([facility])   Social History:   reports that she has never smoked. She has never used smokeless tobacco. She reports that she does not drink alcohol and does not use drugs.  Family History   Problem Relation Age of Onset   Hypertension Mother    Diabetes Brother    Crohn's disease Neg Hx    Inflammatory bowel disease Neg Hx     Medications: Patient's Medications  New Prescriptions   No medications on file  Previous Medications   ACETAMINOPHEN (TYLENOL) 650 MG CR TABLET    Take 650 mg by mouth as needed.    ALENDRONATE (FOSAMAX) 70 MG TABLET    Take 1 tablet (70 mg total) by mouth every 7 (seven) days. Take with a full glass of water on an empty stomach.   ASPIRIN EC 81 MG TABLET    Take 81 mg by mouth daily.   ATENOLOL (TENORMIN) 25 MG TABLET    TAKE 1 TABLET BY MOUTH EVERYDAY AT BEDTIME   BIOTIN 5000 MCG CAPS    Take by mouth daily.   CALCIUM-VITAMIN D (OSCAL WITH D) 500-5 MG-MCG TABLET    Take 1 tablet by mouth 2 (two) times daily.   CHOLECALCIFEROL (VITAMIN D) 2000 UNITS TABLET    Take 2,000 Units by mouth daily.    FERROUS SULFATE (IRON) 325 (65 FE) MG TABS    Take 1 tablet by mouth as directed. On Monday's and Friday's   PANTOPRAZOLE (PROTONIX) 40 MG TABLET    TAKE 1 TABLET BY MOUTH EVERY DAY   SIMVASTATIN (ZOCOR) 40 MG TABLET    TAKE 1 TABLET BY MOUTH DAILY AT 6:00PM  Modified Medications   No medications on file  Discontinued Medications   No medications on file    Physical Exam:  Vitals:   09/09/22 0752  BP: 122/80  Pulse: 62  Temp: 97.9 F (36.6 C)  TempSrc: Temporal  SpO2: 96%  Weight: 152 lb (68.9 kg)  Height: 4' 10"  (1.473 m)   Body mass index is 31.77 kg/m. Wt Readings from Last 3 Encounters:  09/09/22 152 lb (68.9 kg)  04/26/22 152 lb (68.9 kg)  03/04/22 149 lb (67.6 kg)    Physical Exam Constitutional:      General: She is not in acute distress.    Appearance: She is well-developed. She is not diaphoretic.  HENT:     Head: Normocephalic and atraumatic.     Mouth/Throat:     Pharynx: No oropharyngeal exudate.  Eyes:     Conjunctiva/sclera: Conjunctivae normal.     Pupils: Pupils are equal, round, and reactive to light.   Cardiovascular:     Rate and Rhythm: Normal rate and regular rhythm.     Pulses: Normal pulses.     Heart sounds: Normal heart sounds.  Pulmonary:     Effort: Pulmonary effort is normal.     Breath sounds: Normal breath sounds.  Abdominal:     General: Bowel sounds are normal.     Palpations: Abdomen is soft.  Musculoskeletal:        General: No tenderness.     Cervical back: Normal range of motion and neck supple.     Right lower leg: No edema.     Left lower leg: No edema.  Comments: Left leg without swelling, redness or tenderness on exam  Skin:    General: Skin is warm and dry.  Neurological:     Mental Status: She is alert and oriented to person, place, and time.  Psychiatric:        Mood and Affect: Mood normal.     Labs reviewed: Basic Metabolic Panel: Recent Labs    03/01/22 0814 09/06/22 0819  NA 142 142  K 4.3 4.3  CL 108 107  CO2 26 23  GLUCOSE 80 79  BUN 17 16  CREATININE 0.65 0.66  CALCIUM 10.3 10.2   Liver Function Tests: Recent Labs    03/01/22 0814 09/06/22 0819  AST 14 15  ALT 8 11  BILITOT 0.5 0.5  PROT 6.1 6.0*   No results for input(s): "LIPASE", "AMYLASE" in the last 8760 hours. No results for input(s): "AMMONIA" in the last 8760 hours. CBC: Recent Labs    03/01/22 0814 09/06/22 0819  WBC 4.4 4.4  NEUTROABS 2,781 2,649  HGB 12.7 12.1  HCT 37.2 35.3  MCV 92.8 94.1  PLT 282 240   Lipid Panel: Recent Labs    03/01/22 0814  CHOL 160  HDL 57  LDLCALC 80  TRIG 134  CHOLHDL 2.8   TSH: No results for input(s): "TSH" in the last 8760 hours. A1C: No results found for: "HGBA1C"   Assessment/Plan 1. Hyperlipidemia with target LDL less than 130 -continues on zocor with dietary modifications - Lipid panel; Future - COMPLETE METABOLIC PANEL WITH GFR; Future  2. Iron deficiency anemia, unspecified iron deficiency anemia type -stable on recent labs, continues on iron twice a week - CBC with Differential/Platelet;  Future  3. Age-related osteoporosis without current pathological fracture -continues on fosamax, tolerating well. Continue cal and vit D - COMPLETE METABOLIC PANEL WITH GFR; Future  4. Benign hypertensive heart disease without heart failure -Blood pressure well controlled, goal bp <140/90 Continue current medications and dietary modifications follow metabolic panel  5. Gastroesophageal reflux disease without esophagitis -stable at this time, continues on protonix  6. Claudication ([place]) -pain to left calf with walking, improves with rest. Pulses normal. will get ABI for further evaluation at this time.  - VAS [place] ABI WITH/WO TBI; Future   Return in about 6 months (around 03/11/2023) for routine follow up, labs prior . Carlos American. [place], [facility] [phone]

## 2022-09-09 NOTE — Patient Instructions (Signed)
If you do not hear back about the test we have ordered for your leg  in a week please call our office back and ask for an update on referral.    Increase protein in diet.

## 2022-09-14 ENCOUNTER — Ambulatory Visit (HOSPITAL_COMMUNITY)
Admission: RE | Admit: 2022-09-14 | Discharge: 2022-09-14 | Disposition: A | Discharge status: Home / Self Care | Payer: Medicare Other | Source: Ambulatory Visit | Attending: Nurse Practitioner | Admitting: Nurse Practitioner

## 2022-09-14 DIAGNOSIS — I739 Peripheral vascular disease, unspecified: Secondary | ICD-10-CM | POA: Diagnosis present

## 2023-01-13 ENCOUNTER — Ambulatory Visit: Payer: Medicare Other | Admitting: Nurse Practitioner

## 2023-01-13 ENCOUNTER — Ambulatory Visit
Admission: RE | Admit: 2023-01-13 | Discharge: 2023-01-13 | Disposition: A | Discharge status: Home / Self Care | Payer: Medicare Other | Source: Ambulatory Visit | Attending: Nurse Practitioner | Admitting: Nurse Practitioner

## 2023-01-13 ENCOUNTER — Encounter: Payer: Self-pay | Admitting: Nurse Practitioner

## 2023-01-13 VITALS — BP 118/72 | HR 61 | Temp 97.5°F | Resp 16 | Ht <= 58 in | Wt 157.2 lb

## 2023-01-13 DIAGNOSIS — M5441 Lumbago with sciatica, right side: Secondary | ICD-10-CM | POA: Diagnosis not present

## 2023-01-13 DIAGNOSIS — M5442 Lumbago with sciatica, left side: Secondary | ICD-10-CM | POA: Diagnosis not present

## 2023-01-13 NOTE — Patient Instructions (Signed)
To use heating pad to lower back three times daily ~20 mins.  Xray today Continue tylenol as needed, can use tylenol extra strength 500 mg 2 tablets every 8 hours  Physical therapy has been order.

## 2023-01-13 NOTE — Progress Notes (Signed)
Careteam: Patient Care Team: Sharon Seller, NP as PCP - General (Geriatric Medicine) Vedia Coffer Audrie Lia (Physician Assistant) Ernesto Rutherford, MD as Consulting Physician (Ophthalmology) Drema Halon, MD (Inactive) as Consulting Physician (Otolaryngology)  PLACE OF SERVICE:  [facility]  Advanced Directive information    Allergies  Allergen Reactions   Shellfish Allergy Nausea And Vomiting    Chief Complaint  Patient presents with   Hip Pain    Right hip pain that goes down into leg x couple months, off and on, tylenol helps.      HPI: Patient is a 82 y.o. female due to right sided back and leg pain. Pain has been going several months but worse now.  Taking 2 tylenol and 2 in the evening which helps some.  Shooting pain down leg to her knee.  Pain is 8/10.  She has had hx of sciatic pain in the past and therapy help.  She has had some loss of bladder control but this is not typical. No foot drop or weakness to right leg.  No injury.   Review of Systems:  Review of Systems  Constitutional:  Negative for chills, fever and malaise/fatigue.  Musculoskeletal:  Positive for back pain, joint pain and myalgias.  Neurological:  Positive for tingling. Negative for tremors, sensory change, speech change, focal weakness and weakness.   Past Medical History:  Diagnosis Date   Cancer of skin of scalp    2023   Disorder of bone and cartilage, unspecified    Disorders of bursae and tendons in shoulder region, unspecified    Encounter for long-term (current) use of other medications    Esophageal reflux    Hyperosmolality and/or hypernatremia    Osteoporosis, unspecified    Other and unspecified hyperlipidemia    Pain in joint, shoulder region    Pain in limb    Palpitations    Pure hypercholesterolemia    Reflux esophagitis    Tachycardia, unspecified    Unspecified essential hypertension    Unspecified urinary incontinence    Past Surgical History:   Procedure Laterality Date   APPENDECTOMY  09/12/1953   Bone Density  08/14/2012   CATARACT EXTRACTION Bilateral 09/12/2008   CHOLECYSTECTOMY  09/12/2002   COLONOSCOPY  03/31/2009   Diverticulosis, Dr.John Madilyn Fireman    DIAGNOSTIC MAMMOGRAM     LAPAROSCOPY N/A 02/15/2018   Procedure: LAPAROSCOPY DIAGNOSTIC, LYSIS OF ADHESION;  Surgeon: Axel Filler, MD;  Location: [facility];  Service: General;  Laterality: N/A;   VARICOSE VEIN SURGERY Left    Summer 2022, Dr.Featherson ([facility])   Social History:   reports that she has never smoked. She has never used smokeless tobacco. She reports that she does not drink alcohol and does not use drugs.  Family History  Problem Relation Age of Onset   Hypertension Mother    Diabetes Brother    Crohn's disease Neg Hx    Inflammatory bowel disease Neg Hx     Medications: Patient's Medications  New Prescriptions   No medications on file  Previous Medications   ACETAMINOPHEN (TYLENOL) 650 MG CR TABLET    Take 650 mg by mouth as needed.    ALENDRONATE (FOSAMAX) 70 MG TABLET    Take 1 tablet (70 mg total) by mouth every 7 (seven) days. Take with a full glass of water on an empty stomach.   ASPIRIN EC 81 MG TABLET    Take 81 mg by mouth daily.   ATENOLOL (TENORMIN) 25 MG  TABLET    TAKE 1 TABLET BY MOUTH EVERYDAY AT BEDTIME   BIOTIN 5000 MCG CAPS    Take by mouth daily.   CALCIUM-VITAMIN D (OSCAL WITH D) 500-5 MG-MCG TABLET    Take 1 tablet by mouth 2 (two) times daily.   CHOLECALCIFEROL (VITAMIN D) 2000 UNITS TABLET    Take 2,000 Units by mouth daily.    FERROUS SULFATE (IRON) 325 (65 FE) MG TABS    Take 1 tablet by mouth as directed. On Monday's and Friday's   PANTOPRAZOLE (PROTONIX) 40 MG TABLET    TAKE 1 TABLET BY MOUTH EVERY DAY   SIMVASTATIN (ZOCOR) 40 MG TABLET    TAKE 1 TABLET BY MOUTH DAILY AT 6:00PM  Modified Medications   No medications on file  Discontinued Medications   No medications on file    Physical Exam:  Vitals:    01/13/23 0831  BP: 118/72  Pulse: 61  Resp: 16  Temp: (!) 97.5 F (36.4 C)  TempSrc: Temporal  SpO2: 96%  Weight: 157 lb 3.2 oz (71.3 kg)  Height: 4\' 10"  (1.473 m)   Body mass index is 32.85 kg/m. Wt Readings from Last 3 Encounters:  01/13/23 157 lb 3.2 oz (71.3 kg)  09/09/22 152 lb (68.9 kg)  04/26/22 152 lb (68.9 kg)    Physical Exam Constitutional:      Appearance: Normal appearance.  Pulmonary:     Effort: Pulmonary effort is normal.  Musculoskeletal:     Lumbar back: No tenderness. Normal range of motion. Negative right straight leg raise test and negative left straight leg raise test.  Neurological:     Mental Status: She is alert. Mental status is at baseline.     Sensory: Sensation is intact.  Psychiatric:        Mood and Affect: Mood normal.     Labs reviewed: Basic Metabolic Panel: Recent Labs    03/01/22 0814 09/06/22 0819  NA 142 142  K 4.3 4.3  CL 108 107  CO2 26 23  GLUCOSE 80 79  BUN 17 16  CREATININE 0.65 0.66  CALCIUM 10.3 10.2   Liver Function Tests: Recent Labs    03/01/22 0814 09/06/22 0819  AST 14 15  ALT 8 11  BILITOT 0.5 0.5  PROT 6.1 6.0*   No results for input(s): "LIPASE", "AMYLASE" in the last 8760 hours. No results for input(s): "AMMONIA" in the last 8760 hours. CBC: Recent Labs    03/01/22 0814 09/06/22 0819  WBC 4.4 4.4  NEUTROABS 2,781 2,649  HGB 12.7 12.1  HCT 37.2 35.3  MCV 92.8 94.1  PLT 282 240   Lipid Panel: Recent Labs    03/01/22 0814  CHOL 160  HDL 57  LDLCALC 80  TRIG 134  CHOLHDL 2.8   TSH: No results for input(s): "TSH" in the last 8760 hours. A1C: No results found for: "HGBA1C"   Assessment/Plan 1. Acute right-sided low back pain with bilateral sciatica -to use heating pad to low back TID ~20 mins.  -continue to use tylenol as needed for pain - DG Lumbar Spine Complete; Future - Ambulatory referral to Physical Therapy -precautions given to when to seek emergency attention and when  to follow up in office if symptoms worsens.  She is in agreement with plan.   Janene Harvey. [facility] [phone]

## 2023-01-25 ENCOUNTER — Ambulatory Visit: Payer: Medicare Other | Admitting: Physical Therapy

## 2023-02-01 ENCOUNTER — Encounter: Payer: Self-pay | Admitting: Rehabilitative and Restorative Service Providers"

## 2023-02-01 ENCOUNTER — Ambulatory Visit: Payer: Medicare Other | Admitting: Rehabilitative and Restorative Service Providers"

## 2023-02-01 DIAGNOSIS — M5431 Sciatica, right side: Secondary | ICD-10-CM | POA: Diagnosis not present

## 2023-02-01 DIAGNOSIS — R293 Abnormal posture: Secondary | ICD-10-CM | POA: Diagnosis not present

## 2023-02-01 DIAGNOSIS — M5416 Radiculopathy, lumbar region: Secondary | ICD-10-CM

## 2023-02-01 DIAGNOSIS — M6281 Muscle weakness (generalized): Secondary | ICD-10-CM | POA: Diagnosis not present

## 2023-02-01 NOTE — Therapy (Signed)
OUTPATIENT PHYSICAL THERAPY THORACOLUMBAR EVALUATION   Patient Name: Brooke Cox MRN: [id number] DOB:August 16, 1941, 82 y.o., female Today's Date: 02/01/2023  END OF SESSION:  PT End of Session - 02/01/23 1728     Visit Number 1    Number of Visits 12    Date for PT Re-Evaluation 03/29/23    Progress Note Due on Visit 10    PT Start Time 1300    PT Stop Time 1345    PT Time Calculation (min) 45 min    Activity Tolerance Patient tolerated treatment well;No increased pain;Patient limited by pain    Behavior During Therapy [facility] for tasks assessed/performed             Past Medical History:  Diagnosis Date   Cancer of skin of scalp    2023   Disorder of bone and cartilage, unspecified    Disorders of bursae and tendons in shoulder region, unspecified    Encounter for long-term (current) use of other medications    Esophageal reflux    Hyperosmolality and/or hypernatremia    Osteoporosis, unspecified    Other and unspecified hyperlipidemia    Pain in joint, shoulder region    Pain in limb    Palpitations    Pure hypercholesterolemia    Reflux esophagitis    Tachycardia, unspecified    Unspecified essential hypertension    Unspecified urinary incontinence    Past Surgical History:  Procedure Laterality Date   APPENDECTOMY  09/12/1953   Bone Density  08/14/2012   CATARACT EXTRACTION Bilateral 09/12/2008   CHOLECYSTECTOMY  09/12/2002   COLONOSCOPY  03/31/2009   Diverticulosis, Dr.John Madilyn Fireman    DIAGNOSTIC MAMMOGRAM     LAPAROSCOPY N/A 02/15/2018   Procedure: LAPAROSCOPY DIAGNOSTIC, LYSIS OF ADHESION;  Surgeon: Axel Filler, MD;  Location: [facility];  Service: General;  Laterality: N/A;   VARICOSE VEIN SURGERY Left    Summer 2022, Dr.Featherson ( Vein)   Patient Active Problem List   Diagnosis Date Noted   Obesity (BMI 30.0-34.9) 01/30/2017   LBBB (left bundle branch block) 08/27/2014   Osteopenia 07/23/2014   Intraventricular conduction delay 07/23/2014    Intraventricular block 07/23/2014   Sigmoid diverticulosis 07/23/2014   Benign hypertensive heart disease without heart failure 07/03/2013   GERD (gastroesophageal reflux disease) 07/03/2013   Hyperlipidemia with target LDL less than 130 07/03/2013    PCP: Sharon Seller, NP  REFERRING PROVIDER: Sharon Seller, NP  REFERRING DIAG:  Diagnosis  M54.42,M54.41 (ICD-10-CM) - Acute right-sided low back pain with bilateral sciatica    Rationale for Evaluation and Treatment: Rehabilitation  THERAPY DIAG:  Abnormal posture - Plan: PT plan of care cert/re-cert  Muscle weakness (generalized) - Plan: PT plan of care cert/re-cert  Radiculopathy, lumbar region - Plan: PT plan of care cert/re-cert  Sciatica, right side - Plan: PT plan of care cert/re-cert  ONSET DATE: 3 months ago  SUBJECTIVE:  SUBJECTIVE STATEMENT: Embrey notes right sided gluteal and leg pain just distal to the knee.  Symptoms have been present for about 3 months and do not appear to be getting better.  She does note more difficulty when standing, particularly with activities involving flexion.  PERTINENT HISTORY:  Osteoporosis, HLD, obesity, relevant cardiac history  PAIN:  Are you having pain? Yes: NPRS scale: 0-4/10 this week/10 Pain location: Right gluteal and leg just below the knee Pain description: Burning Aggravating factors: Worse during the day, with damp weather Relieving factors: Tylenol  PRECAUTIONS: Back  WEIGHT BEARING RESTRICTIONS: No  FALLS:  Has patient fallen in last 6 months? No  LIVING ENVIRONMENT: Lives with: lives with their family and lives with their spouse Lives in: House/apartment Stairs:  OK with a handrail Has following equipment at home: Grab bars  OCCUPATION: Retired  PLOF:  Independent  PATIENT GOALS: Keep up the house  NEXT MD VISIT: 03/13/2023  OBJECTIVE:   DIAGNOSTIC FINDINGS:  No fracture or dislocation. Mild narrowing of interspaces L2-L5. Early grade 1 anterolisthesis L4-5. Facet DJD bilaterally L3-S1. Aortic Atherosclerosis (ICD10-170.0). Cholecystectomy clips. Osteitis pubis.No fracture or dislocation. Mild narrowing of interspaces L2-L5.  1. No acute findings. 2. Multilevel lumbar degenerative changes as above.  PATIENT SURVEYS:  FOTO 59, risk adjusted 50 (Goal 68 in 10 visits)  SCREENING FOR RED FLAGS: Bowel or bladder incontinence: No Spinal tumors: No Cauda equina syndrome: No Compression fracture: No  COGNITION: Overall cognitive status: Within functional limits for tasks assessed     SENSATION: Hortencia notes right gluteal pain that can travel just distal to her right knee  MUSCLE LENGTH: Hamstrings: Right 40 deg; Left 40 deg  POSTURE: rounded shoulders, forward head, decreased lumbar lordosis, and flexed trunk    LUMBAR ROM:   AROM 02/01/2023  Flexion   Extension 5  Right lateral flexion   Left lateral flexion   Right rotation   Left rotation    (Blank rows = not tested)  LOWER EXTREMITY ROM:     Passive  Left/Right 02/01/2023   Hip flexion 95/90   Hip extension    Hip abduction    Hip adduction    Hip internal rotation 10/10   Hip external rotation 27/22   Knee flexion    Knee extension    Ankle dorsiflexion    Ankle plantarflexion    Ankle inversion    Ankle eversion     (Blank rows = not tested)  LOWER EXTREMITY STRENGTH:    STRENGTH 02/01/2023   Hip flexion    Hip extension    Hip abduction    Hip adduction    Hip internal rotation    Hip external rotation    Knee flexion    Knee extension    Ankle dorsiflexion    Ankle plantarflexion    Ankle inversion    Ankle eversion    Lumbar extension Deferred secondary to inability to assume test postures    (Blank rows = not tested)  GAIT: Distance  walked: Within the office, approximately 100 feet Assistive device utilized: None Level of assistance: Complete Independence Comments: Significant for forward flexion at the hips  TODAY'S TREATMENT:  DATE: 02/01/2023 Standing trunk extension AROM 10 x 3 seconds Shoulder blade pinches 10 x 5 seconds Supine single knee-to-chest with other leg straight 2 x 20 seconds Supine hamstrings stretch with other leg straight 2 x 20 seconds  Functional Activities: Reviewed imaging with spine model, discussed the importance of avoiding prolonged postures, avoiding flexion and flexion with rotation, techniques to use while washing dishes and vacuuming to avoid flexion, reviewed her day 1 home exercise program, discussed the importance of a home walking program and discussed the expected duration of her supervised rehabilitation 6 to 8 weeks   PATIENT EDUCATION:  Education details: See above Person educated: Patient Education method: Explanation, Demonstration, Tactile cues, Verbal cues, and Handouts Education comprehension: verbalized understanding, returned demonstration, verbal cues required, tactile cues required, and needs further education  HOME EXERCISE PROGRAM: [id number]  ASSESSMENT:  CLINICAL IMPRESSION: Patient is a 82 y.o. female who was seen today for physical therapy evaluation and treatment for right-sided gluteal and lower extremity pain.  Delsy has had the symptoms for approximately 3 months and things do not seem to be improving on their own.  A flexed posture, limited trunk extension AROM, tight hip musculature and likely weak postural and lumbar muscles are contributing to this problem.  Her prognosis to meet the below listed goals is good with the recommended plan of care.  OBJECTIVE IMPAIRMENTS: decreased activity tolerance, decreased endurance, decreased  knowledge of condition, difficulty walking, decreased ROM, decreased strength, decreased safety awareness, impaired perceived functional ability, increased muscle spasms, impaired flexibility, improper body mechanics, postural dysfunction, obesity, and pain.   ACTIVITY LIMITATIONS: carrying, lifting, bending, standing, bed mobility, and locomotion level  PARTICIPATION LIMITATIONS: meal prep, cleaning, laundry, and community activity  PERSONAL FACTORS: Osteoporosis, HLD, obesity, relevant cardiac history are also affecting patient's functional outcome.   REHAB POTENTIAL: Good  CLINICAL DECISION MAKING: Stable/uncomplicated  EVALUATION COMPLEXITY: Low   GOALS: Goals reviewed with patient? Yes  SHORT TERM GOALS: Target date: 03/01/2023  Denaisha will be independent with her day 1 home exercise program Baseline: Started 02/01/2023 Goal status: INITIAL  2.  Improve standing trunk extension AROM to 10 degrees Baseline: 5 degrees Goal status: INITIAL  3.  Dalene will be able to demonstrate correct sitting and standing posture Baseline: Unable at evaluation Goal status: INITIAL  4.  Delsia will report and demonstrate improved body mechanics with household chores and bed mobility Baseline: Based on observation and patient self-report, this will need to be addressed Goal status: INITIAL   LONG TERM GOALS: Target date: 03/29/2023  Improve FOTO to 68 in 10 visits Baseline: 59, risk adjusted 50 Goal status: INITIAL  2.  Improve right gluteal and right leg pain to consistently 0-2 out of 10 on the numeric pain rating scale Baseline: Symptoms require pain medication and can be 4+ out of 10 Goal status: INITIAL  3.  Improve bilateral lower extremity flexibility for hip flexors to 100 degrees, hamstrings to 40 degrees and hip external rotation to 40 degrees Baseline: 95/90; 40/40; 27/22 Goal status: INITIAL  4.  Improve Malyia's spine strength as assessed by FOTO scores, pain scores and  objective testing Baseline: Deferred at examination due to difficulty with test postures Goal status: INITIAL  5.  Rolonda will be independent with her long-term home exercise program at discharge Baseline: Started 02/01/2023 Goal status: INITIAL  PLAN:  PT FREQUENCY: 1-2x/week  PT DURATION: 8 weeks  PLANNED INTERVENTIONS: Therapeutic exercises, Therapeutic activity, Neuromuscular re-education, Gait training, Patient/Family education, Self Care, Dry Needling, Cryotherapy,  Traction, and Manual therapy.  PLAN FOR NEXT SESSION: Review day 1 home exercise program, practical posture and body mechanics work for ADLs, progress scapular and low back strength, consider adding a hip ER stretch to complement her 2 other supine stretches from day 1.   Cherlyn Cushing, PT, MPT 02/01/2023, 5:47 PM

## 2023-02-15 ENCOUNTER — Encounter: Payer: Self-pay | Admitting: Rehabilitative and Restorative Service Providers"

## 2023-02-15 ENCOUNTER — Ambulatory Visit: Payer: Medicare Other | Admitting: Rehabilitative and Restorative Service Providers"

## 2023-02-15 DIAGNOSIS — M5416 Radiculopathy, lumbar region: Secondary | ICD-10-CM

## 2023-02-15 DIAGNOSIS — M6281 Muscle weakness (generalized): Secondary | ICD-10-CM

## 2023-02-15 DIAGNOSIS — M5431 Sciatica, right side: Secondary | ICD-10-CM

## 2023-02-15 DIAGNOSIS — R293 Abnormal posture: Secondary | ICD-10-CM

## 2023-02-15 NOTE — Therapy (Signed)
OUTPATIENT PHYSICAL THERAPY THORACOLUMBAR TREATMENT   Patient Name: Brooke Cox MRN: [id number] DOB:[birth year], 82 y.o., female Today's Date: 02/15/2023  END OF SESSION:  PT End of Session - 02/15/23 1508     Visit Number 2    Number of Visits 12    Date for PT Re-Evaluation 03/29/23    Progress Note Due on Visit 10    PT Start Time 1507    PT Stop Time 1551    PT Time Calculation (min) 44 min    Activity Tolerance Patient tolerated treatment well;No increased pain    Behavior During Therapy WFL for tasks assessed/performed              Past Medical History:  Diagnosis Date   Cancer of skin of scalp    2023   Disorder of bone and cartilage, unspecified    Disorders of bursae and tendons in shoulder region, unspecified    Encounter for long-term (current) use of other medications    Esophageal reflux    Hyperosmolality and/or hypernatremia    Osteoporosis, unspecified    Other and unspecified hyperlipidemia    Pain in joint, shoulder region    Pain in limb    Palpitations    Pure hypercholesterolemia    Reflux esophagitis    Tachycardia, unspecified    Unspecified essential hypertension    Unspecified urinary incontinence    Past Surgical History:  Procedure Laterality Date   APPENDECTOMY  09/12/1953   Bone Density  08/14/2012   CATARACT EXTRACTION Bilateral 09/12/2008   CHOLECYSTECTOMY  09/12/2002   COLONOSCOPY  03/31/2009   Diverticulosis, Dr.John Madilyn Fireman    DIAGNOSTIC MAMMOGRAM     LAPAROSCOPY N/A 02/15/2018   Procedure: LAPAROSCOPY DIAGNOSTIC, LYSIS OF ADHESION;  Surgeon: Axel Filler, MD;  Location: [facility];  Service: General;  Laterality: N/A;   VARICOSE VEIN SURGERY Left    Summer 2022, Dr.Featherson ([facility])   Patient Active Problem List   Diagnosis Date Noted   Obesity (BMI 30.0-34.9) 01/30/2017   LBBB (left bundle branch block) 08/27/2014   Osteopenia 07/23/2014   Intraventricular conduction delay 07/23/2014   Intraventricular  block 07/23/2014   Sigmoid diverticulosis 07/23/2014   Benign hypertensive heart disease without heart failure 07/03/2013   GERD (gastroesophageal reflux disease) 07/03/2013   Hyperlipidemia with target LDL less than 130 07/03/2013    PCP: Sharon Seller, NP  REFERRING PROVIDER: Sharon Seller, NP  REFERRING DIAG:  Diagnosis  M54.42,M54.41 (ICD-10-CM) - Acute right-sided low back pain with bilateral sciatica    Rationale for Evaluation and Treatment: Rehabilitation  THERAPY DIAG:  Abnormal posture  Muscle weakness (generalized)  Radiculopathy, lumbar region  Sciatica, right side  ONSET DATE: 3 months ago  SUBJECTIVE:  SUBJECTIVE STATEMENT: Brooke Cox notes right sided gluteal and leg pain just distal to the knee.  HEP has been good.  No major changes in symptoms from visit 1.     Symptoms have been present for about 3 months and do not appear to be getting better.  She does note more difficulty when standing, particularly with activities involving flexion.  PERTINENT HISTORY:  Osteoporosis, HLD, obesity, relevant cardiac history  PAIN:  Are you having pain? Yes: NPRS scale: 0-8/10 this week with walking and 1 time at night on a 10/10 Pain location: Right gluteal and leg just below the knee Pain description: Burning Aggravating factors: Worse during the day, with damp weather Relieving factors: Tylenol  PRECAUTIONS: Back  WEIGHT BEARING RESTRICTIONS: No  FALLS:  Has patient fallen in last 6 months? No  LIVING ENVIRONMENT: Lives with: lives with their family and lives with their spouse Lives in: House/apartment Stairs:  OK with a handrail Has following equipment at home: Grab bars  OCCUPATION: Retired  PLOF: Independent  PATIENT GOALS: Keep up the house  NEXT MD VISIT:  03/13/2023  OBJECTIVE:   DIAGNOSTIC FINDINGS:  No fracture or dislocation. Mild narrowing of interspaces L2-L5. Early grade 1 anterolisthesis L4-5. Facet DJD bilaterally L3-S1. Aortic Atherosclerosis (ICD10-170.0). Cholecystectomy clips. Osteitis pubis.No fracture or dislocation. Mild narrowing of interspaces L2-L5.  1. No acute findings. 2. Multilevel lumbar degenerative changes as above.  PATIENT SURVEYS:  FOTO 59, risk adjusted 50 (Goal 68 in 10 visits)  SCREENING FOR RED FLAGS: Bowel or bladder incontinence: No Spinal tumors: No Cauda equina syndrome: No Compression fracture: No  COGNITION: Overall cognitive status: Within functional limits for tasks assessed     SENSATION: Brooke Cox notes right gluteal pain that can travel just distal to her right knee  MUSCLE LENGTH: Hamstrings: Right 40 deg; Left 40 deg  POSTURE: rounded shoulders, forward head, decreased lumbar lordosis, and flexed trunk    LUMBAR ROM:   AROM 02/01/2023  Flexion   Extension 5  Right lateral flexion   Left lateral flexion   Right rotation   Left rotation    (Blank rows = not tested)  LOWER EXTREMITY ROM:     Passive  Left/Right 02/01/2023   Hip flexion 95/90   Hip extension    Hip abduction    Hip adduction    Hip internal rotation 10/10   Hip external rotation 27/22   Knee flexion    Knee extension    Ankle dorsiflexion    Ankle plantarflexion    Ankle inversion    Ankle eversion     (Blank rows = not tested)  LOWER EXTREMITY STRENGTH:    STRENGTH 02/01/2023   Hip flexion    Hip extension    Hip abduction    Hip adduction    Hip internal rotation    Hip external rotation    Knee flexion    Knee extension    Ankle dorsiflexion    Ankle plantarflexion    Ankle inversion    Ankle eversion    Lumbar extension Deferred secondary to inability to assume test postures    (Blank rows = not tested)  GAIT: Distance walked: Within the office, approximately 100 feet Assistive  device utilized: None Level of assistance: Complete Independence Comments: Significant for forward flexion at the hips  TODAY'S TREATMENT:  DATE:  02/15/2023 Standing trunk extension AROM 10 x 3 seconds Shoulder blade pinches 10 x 5 seconds Supine single knee-to-chest with other leg straight 5 x 20 seconds Supine hamstrings stretch with other leg straight 5 x 20 seconds Figure 4 stretch 5 x 20 seconds Yoga bridge 10 x 5 seconds Hip hike at counter top 10X 3 seconds  Functional Activities: Sit to stand with slow eccentrics 5X 3 seconds (with shoulder blade pinch and lumbar extension when standing); review of disc pressures in various positions chart; review of correct lumbar roll use; log roll and reviewed the importance of avoiding flexion   02/01/2023 Standing trunk extension AROM 10 x 3 seconds Shoulder blade pinches 10 x 5 seconds Supine single knee-to-chest with other leg straight 2 x 20 seconds Supine hamstrings stretch with other leg straight 2 x 20 seconds  Functional Activities: Reviewed imaging with spine model, discussed the importance of avoiding prolonged postures, avoiding flexion and flexion with rotation, techniques to use while washing dishes and vacuuming to avoid flexion, reviewed her day 1 home exercise program, discussed the importance of a home walking program and discussed the expected duration of her supervised rehabilitation 6 to 8 weeks   PATIENT EDUCATION:  Education details: See above Person educated: Patient Education method: Explanation, Demonstration, Tactile cues, Verbal cues, and Handouts Education comprehension: verbalized understanding, returned demonstration, verbal cues required, tactile cues required, and needs further education  HOME EXERCISE PROGRAM: [id number]  ASSESSMENT:  CLINICAL IMPRESSION: Skylie reports and  demonstrates good early HEP compliance.  Her 3 month history of symptoms will require the full time recommended at evaluation to meet long-term goals.  A flexed posture, limited trunk extension AROM, tight hip musculature and weak postural and lumbar muscles were addressed today and will be the focus of continued work.  Her prognosis is good with the recommended POC.  OBJECTIVE IMPAIRMENTS: decreased activity tolerance, decreased endurance, decreased knowledge of condition, difficulty walking, decreased ROM, decreased strength, decreased safety awareness, impaired perceived functional ability, increased muscle spasms, impaired flexibility, improper body mechanics, postural dysfunction, obesity, and pain.   ACTIVITY LIMITATIONS: carrying, lifting, bending, standing, bed mobility, and locomotion level  PARTICIPATION LIMITATIONS: meal prep, cleaning, laundry, and community activity  PERSONAL FACTORS: Osteoporosis, HLD, obesity, relevant cardiac history are also affecting patient's functional outcome.   REHAB POTENTIAL: Good  CLINICAL DECISION MAKING: Stable/uncomplicated  EVALUATION COMPLEXITY: Low   GOALS: Goals reviewed with patient? Yes  SHORT TERM GOALS: Target date: 03/01/2023  Calyssa will be independent with her day 1 home exercise program Baseline: Started 02/01/2023 Goal status: On Going 02/15/2023  2.  Improve standing trunk extension AROM to 10 degrees Baseline: 5 degrees Goal status: INITIAL  3.  Aurion will be able to demonstrate correct sitting and standing posture Baseline: Unable at evaluation Goal status: On Going 02/15/2023  4.  Mariyana will report and demonstrate improved body mechanics with household chores and bed mobility Baseline: Based on observation and patient self-report, this will need to be addressed Goal status: On Going 02/15/2023   LONG TERM GOALS: Target date: 03/29/2023  Improve FOTO to 68 in 10 visits Baseline: 59, risk adjusted 50 Goal status:  INITIAL  2.  Improve right gluteal and right leg pain to consistently 0-2 out of 10 on the numeric pain rating scale Baseline: Symptoms require pain medication and can be 4+ out of 10 Goal status: On Going 02/15/2023  3.  Improve bilateral lower extremity flexibility for hip flexors to 100 degrees, hamstrings to 40 degrees  and hip external rotation to 40 degrees Baseline: 95/90; 40/40; 27/22 Goal status: INITIAL  4.  Improve Asencion's spine strength as assessed by FOTO scores, pain scores and objective testing Baseline: Deferred at examination due to difficulty with test postures Goal status: INITIAL  5.  Idalia will be independent with her long-term home exercise program at discharge Baseline: Started 02/01/2023 Goal status: INITIAL  PLAN:  PT FREQUENCY: 1-2x/week  PT DURATION: 8 weeks  PLANNED INTERVENTIONS: Therapeutic exercises, Therapeutic activity, Neuromuscular re-education, Gait training, Patient/Family education, Self Care, Dry Needling, Cryotherapy, Traction, and Manual therapy.  PLAN FOR NEXT SESSION: Review home exercise program, practical posture and body mechanics work for ADLs, progress scapular and low back strength.   Cherlyn Cushing, PT, MPT 02/15/2023, 4:01 PM

## 2023-02-17 ENCOUNTER — Ambulatory Visit (INDEPENDENT_AMBULATORY_CARE_PROVIDER_SITE_OTHER): Payer: Medicare Other | Admitting: Rehabilitative and Restorative Service Providers"

## 2023-02-17 ENCOUNTER — Other Ambulatory Visit: Payer: Self-pay | Admitting: Nurse Practitioner

## 2023-02-17 ENCOUNTER — Encounter: Payer: Self-pay | Admitting: Rehabilitative and Restorative Service Providers"

## 2023-02-17 DIAGNOSIS — I119 Hypertensive heart disease without heart failure: Secondary | ICD-10-CM

## 2023-02-17 DIAGNOSIS — M5416 Radiculopathy, lumbar region: Secondary | ICD-10-CM

## 2023-02-17 DIAGNOSIS — M6281 Muscle weakness (generalized): Secondary | ICD-10-CM

## 2023-02-17 DIAGNOSIS — M5431 Sciatica, right side: Secondary | ICD-10-CM | POA: Diagnosis not present

## 2023-02-17 DIAGNOSIS — E785 Hyperlipidemia, unspecified: Secondary | ICD-10-CM

## 2023-02-17 DIAGNOSIS — R293 Abnormal posture: Secondary | ICD-10-CM

## 2023-02-17 NOTE — Therapy (Signed)
OUTPATIENT PHYSICAL THERAPY THORACOLUMBAR TREATMENT   Patient Name: Brooke Cox MRN: [id number] DOB:[birth year], 82 y.o., female Today's Date: 02/17/2023  END OF SESSION:  PT End of Session - 02/17/23 1102     Visit Number 3    Number of Visits 12    Date for PT Re-Evaluation 03/29/23    Progress Note Due on Visit 10    PT Start Time 1101    PT Stop Time 1140    PT Time Calculation (min) 39 min    Activity Tolerance Patient tolerated treatment well;No increased pain    Behavior During Therapy WFL for tasks assessed/performed               Past Medical History:  Diagnosis Date   Cancer of skin of scalp    2023   Disorder of bone and cartilage, unspecified    Disorders of bursae and tendons in shoulder region, unspecified    Encounter for long-term (current) use of other medications    Esophageal reflux    Hyperosmolality and/or hypernatremia    Osteoporosis, unspecified    Other and unspecified hyperlipidemia    Pain in joint, shoulder region    Pain in limb    Palpitations    Pure hypercholesterolemia    Reflux esophagitis    Tachycardia, unspecified    Unspecified essential hypertension    Unspecified urinary incontinence    Past Surgical History:  Procedure Laterality Date   APPENDECTOMY  09/12/1953   Bone Density  08/14/2012   CATARACT EXTRACTION Bilateral 09/12/2008   CHOLECYSTECTOMY  09/12/2002   COLONOSCOPY  03/31/2009   Diverticulosis, Dr.John Madilyn Fireman    DIAGNOSTIC MAMMOGRAM     LAPAROSCOPY N/A 02/15/2018   Procedure: LAPAROSCOPY DIAGNOSTIC, LYSIS OF ADHESION;  Surgeon: Axel Filler, MD;  Location: [facility];  Service: General;  Laterality: N/A;   VARICOSE VEIN SURGERY Left    Summer 2022, Dr.Featherson ( Vein)   Patient Active Problem List   Diagnosis Date Noted   Obesity (BMI 30.0-34.9) 01/30/2017   LBBB (left bundle branch block) 08/27/2014   Osteopenia 07/23/2014   Intraventricular conduction delay 07/23/2014   Intraventricular  block 07/23/2014   Sigmoid diverticulosis 07/23/2014   Benign hypertensive heart disease without heart failure 07/03/2013   GERD (gastroesophageal reflux disease) 07/03/2013   Hyperlipidemia with target LDL less than 130 07/03/2013    PCP: Sharon Seller, NP  REFERRING PROVIDER: Sharon Seller, NP  REFERRING DIAG:  Diagnosis  M54.42,M54.41 (ICD-10-CM) - Acute right-sided low back pain with bilateral sciatica    Rationale for Evaluation and Treatment: Rehabilitation  THERAPY DIAG:  Abnormal posture  Radiculopathy, lumbar region  Muscle weakness (generalized)  Sciatica, right side  ONSET DATE: 3 months ago  SUBJECTIVE:  SUBJECTIVE STATEMENT: Evalynne notes right sided gluteal and leg pain just above the knee (was distal to the knee).  HEP remains good and she notes less pain Wednesday and today.  PERTINENT HISTORY:  Osteoporosis, HLD, obesity, relevant cardiac history  PAIN:  Are you having pain? Yes: NPRS scale: 0-8/10 this week with walking and 1 time at night on a 10/10 Pain location: Right gluteal and leg just below the knee Pain description: Burning Aggravating factors: Worse during the day, with damp weather Relieving factors: Tylenol  PRECAUTIONS: Back  WEIGHT BEARING RESTRICTIONS: No  FALLS:  Has patient fallen in last 6 months? No  LIVING ENVIRONMENT: Lives with: lives with their family and lives with their spouse Lives in: House/apartment Stairs:  OK with a handrail Has following equipment at home: Grab bars  OCCUPATION: Retired  PLOF: Independent  PATIENT GOALS: Keep up the house  NEXT MD VISIT: 03/13/2023  OBJECTIVE:   DIAGNOSTIC FINDINGS:  No fracture or dislocation. Mild narrowing of interspaces L2-L5. Early grade 1 anterolisthesis L4-5. Facet DJD  bilaterally L3-S1. Aortic Atherosclerosis (ICD10-170.0). Cholecystectomy clips. Osteitis pubis.No fracture or dislocation. Mild narrowing of interspaces L2-L5.  1. No acute findings. 2. Multilevel lumbar degenerative changes as above.  PATIENT SURVEYS:  FOTO 59, risk adjusted 50 (Goal 68 in 10 visits)  SCREENING FOR RED FLAGS: Bowel or bladder incontinence: No Spinal tumors: No Cauda equina syndrome: No Compression fracture: No  COGNITION: Overall cognitive status: Within functional limits for tasks assessed     SENSATION: Jennessy notes right gluteal pain that can travel just distal to her right knee  MUSCLE LENGTH: Hamstrings: Right 40 deg; Left 40 deg  POSTURE: rounded shoulders, forward head, decreased lumbar lordosis, and flexed trunk    LUMBAR ROM:   AROM 02/01/2023  Flexion   Extension 5  Right lateral flexion   Left lateral flexion   Right rotation   Left rotation    (Blank rows = not tested)  LOWER EXTREMITY ROM:     Passive  Left/Right 02/01/2023   Hip flexion 95/90   Hip extension    Hip abduction    Hip adduction    Hip internal rotation 10/10   Hip external rotation 27/22   Knee flexion    Knee extension    Ankle dorsiflexion    Ankle plantarflexion    Ankle inversion    Ankle eversion     (Blank rows = not tested)  LOWER EXTREMITY STRENGTH:    STRENGTH 02/01/2023   Hip flexion    Hip extension    Hip abduction    Hip adduction    Hip internal rotation    Hip external rotation    Knee flexion    Knee extension    Ankle dorsiflexion    Ankle plantarflexion    Ankle inversion    Ankle eversion    Lumbar extension Deferred secondary to inability to assume test postures    (Blank rows = not tested)  GAIT: Distance walked: Within the office, approximately 100 feet Assistive device utilized: None Level of assistance: Complete Independence Comments: Significant for forward flexion at the hips  TODAY'S TREATMENT:  DATE:  02/17/2023 Standing trunk extension AROM 10 x 3 seconds Shoulder blade pinches 10 x 5 seconds Supine single knee-to-chest with other leg straight 5 x 20 seconds Supine hamstrings stretch with other leg straight 5 x 20 seconds Figure 4 stretch 5 x 20 seconds Yoga bridge 2 sets of 10 x 5 seconds Hip hike at counter top 10X 3 seconds  Functional Activities: Sit to stand with slow eccentrics 5X 3 seconds (with shoulder blade pinch and lumbar extension when standing); review of correct lumbar roll use; log roll   02/15/2023 Standing trunk extension AROM 10 x 3 seconds Shoulder blade pinches 10 x 5 seconds Supine single knee-to-chest with other leg straight 5 x 20 seconds Supine hamstrings stretch with other leg straight 5 x 20 seconds Figure 4 stretch 5 x 20 seconds Yoga bridge 10 x 5 seconds Hip hike at counter top 10X 3 seconds  Functional Activities: Sit to stand with slow eccentrics 5X 3 seconds (with shoulder blade pinch and lumbar extension when standing); review of disc pressures in various positions chart; review of correct lumbar roll use; log roll and reviewed the importance of avoiding flexion   02/01/2023 Standing trunk extension AROM 10 x 3 seconds Shoulder blade pinches 10 x 5 seconds Supine single knee-to-chest with other leg straight 2 x 20 seconds Supine hamstrings stretch with other leg straight 2 x 20 seconds  Functional Activities: Reviewed imaging with spine model, discussed the importance of avoiding prolonged postures, avoiding flexion and flexion with rotation, techniques to use while washing dishes and vacuuming to avoid flexion, reviewed her day 1 home exercise program, discussed the importance of a home walking program and discussed the expected duration of her supervised rehabilitation 6 to 8 weeks   PATIENT EDUCATION:  Education details: See  above Person educated: Patient Education method: Explanation, Demonstration, Tactile cues, Verbal cues, and Handouts Education comprehension: verbalized understanding, returned demonstration, verbal cues required, tactile cues required, and needs further education  HOME EXERCISE PROGRAM: [id number]  ASSESSMENT:  CLINICAL IMPRESSION: Alicya continues her good early HEP compliance.  Already, she notes less pain 2 of the past 3 days.  A flexed posture, limited trunk extension AROM, tight hip musculature and weak postural and lumbar muscles were again addressed today and will be the focus of continued work.  Her prognosis is good with the recommended POC.  OBJECTIVE IMPAIRMENTS: decreased activity tolerance, decreased endurance, decreased knowledge of condition, difficulty walking, decreased ROM, decreased strength, decreased safety awareness, impaired perceived functional ability, increased muscle spasms, impaired flexibility, improper body mechanics, postural dysfunction, obesity, and pain.   ACTIVITY LIMITATIONS: carrying, lifting, bending, standing, bed mobility, and locomotion level  PARTICIPATION LIMITATIONS: meal prep, cleaning, laundry, and community activity  PERSONAL FACTORS: Osteoporosis, HLD, obesity, relevant cardiac history are also affecting patient's functional outcome.   REHAB POTENTIAL: Good  CLINICAL DECISION MAKING: Stable/uncomplicated  EVALUATION COMPLEXITY: Low   GOALS: Goals reviewed with patient? Yes  SHORT TERM GOALS: Target date: 03/01/2023  Anira will be independent with her day 1 home exercise program Baseline: Started 02/01/2023 Goal status: On Going 02/17/2023  2.  Improve standing trunk extension AROM to 10 degrees Baseline: 5 degrees Goal status: INITIAL  3.  Rokaya will be able to demonstrate correct sitting and standing posture Baseline: Unable at evaluation Goal status: On Going 02/17/2023  4.  Hennessey will report and demonstrate improved body  mechanics with household chores and bed mobility Baseline: Based on observation and patient self-report, this will need to be addressed  Goal status: On Going 02/17/2023   LONG TERM GOALS: Target date: 03/29/2023  Improve FOTO to 68 in 10 visits Baseline: 59, risk adjusted 50 Goal status: INITIAL  2.  Improve right gluteal and right leg pain to consistently 0-2 out of 10 on the numeric pain rating scale Baseline: Symptoms require pain medication and can be 4+ out of 10 Goal status: On Going 02/17/2023  3.  Improve bilateral lower extremity flexibility for hip flexors to 100 degrees, hamstrings to 40 degrees and hip external rotation to 40 degrees Baseline: 95/90; 40/40; 27/22 Goal status: INITIAL  4.  Improve Aylissa's spine strength as assessed by FOTO scores, pain scores and objective testing Baseline: Deferred at examination due to difficulty with test postures Goal status: INITIAL  5.  Beverli will be independent with her long-term home exercise program at discharge Baseline: Started 02/01/2023 Goal status: INITIAL  PLAN:  PT FREQUENCY: 1-2x/week  PT DURATION: 8 weeks  PLANNED INTERVENTIONS: Therapeutic exercises, Therapeutic activity, Neuromuscular re-education, Gait training, Patient/Family education, Self Care, Dry Needling, Cryotherapy, Traction, and Manual therapy.  PLAN FOR NEXT SESSION: Review home exercise program, practical posture and body mechanics work for ADLs, progress scapular and low back strength.   Cherlyn Cushing, PT, MPT 02/17/2023, 11:43 AM

## 2023-02-22 ENCOUNTER — Encounter: Payer: Self-pay | Admitting: Rehabilitative and Restorative Service Providers"

## 2023-02-22 ENCOUNTER — Ambulatory Visit (INDEPENDENT_AMBULATORY_CARE_PROVIDER_SITE_OTHER): Payer: Medicare Other | Admitting: Rehabilitative and Restorative Service Providers"

## 2023-02-22 DIAGNOSIS — M5431 Sciatica, right side: Secondary | ICD-10-CM

## 2023-02-22 DIAGNOSIS — R293 Abnormal posture: Secondary | ICD-10-CM

## 2023-02-22 DIAGNOSIS — M5416 Radiculopathy, lumbar region: Secondary | ICD-10-CM

## 2023-02-22 DIAGNOSIS — M6281 Muscle weakness (generalized): Secondary | ICD-10-CM | POA: Diagnosis not present

## 2023-02-22 NOTE — Therapy (Signed)
OUTPATIENT PHYSICAL THERAPY THORACOLUMBAR TREATMENT   Patient Name: Shamon Buckingham MRN: [id number] DOB:[birth year], 82 y.o., female Today's Date: 02/22/2023  END OF SESSION:  PT End of Session - 02/22/23 1515     Visit Number 4    Number of Visits 12    Date for PT Re-Evaluation 03/29/23    Progress Note Due on Visit 10    PT Start Time 1514    PT Stop Time 1556    PT Time Calculation (min) 42 min    Activity Tolerance Patient tolerated treatment well;No increased pain    Behavior During Therapy WFL for tasks assessed/performed                Past Medical History:  Diagnosis Date   Cancer of skin of scalp    2023   Disorder of bone and cartilage, unspecified    Disorders of bursae and tendons in shoulder region, unspecified    Encounter for long-term (current) use of other medications    Esophageal reflux    Hyperosmolality and/or hypernatremia    Osteoporosis, unspecified    Other and unspecified hyperlipidemia    Pain in joint, shoulder region    Pain in limb    Palpitations    Pure hypercholesterolemia    Reflux esophagitis    Tachycardia, unspecified    Unspecified essential hypertension    Unspecified urinary incontinence    Past Surgical History:  Procedure Laterality Date   APPENDECTOMY  09/12/1953   Bone Density  08/14/2012   CATARACT EXTRACTION Bilateral 09/12/2008   CHOLECYSTECTOMY  09/12/2002   COLONOSCOPY  03/31/2009   Diverticulosis, Dr.John Madilyn Fireman    DIAGNOSTIC MAMMOGRAM     LAPAROSCOPY N/A 02/15/2018   Procedure: LAPAROSCOPY DIAGNOSTIC, LYSIS OF ADHESION;  Surgeon: Axel Filler, MD;  Location: [facility];  Service: General;  Laterality: N/A;   VARICOSE VEIN SURGERY Left    Summer 2022, Dr.Featherson ([facility])   Patient Active Problem List   Diagnosis Date Noted   Obesity (BMI 30.0-34.9) 01/30/2017   LBBB (left bundle branch block) 08/27/2014   Osteopenia 07/23/2014   Intraventricular conduction delay 07/23/2014   Intraventricular  block 07/23/2014   Sigmoid diverticulosis 07/23/2014   Benign hypertensive heart disease without heart failure 07/03/2013   GERD (gastroesophageal reflux disease) 07/03/2013   Hyperlipidemia with target LDL less than 130 07/03/2013    PCP: Sharon Seller, NP  REFERRING PROVIDER: Sharon Seller, NP  REFERRING DIAG:  Diagnosis  M54.42,M54.41 (ICD-10-CM) - Acute right-sided low back pain with bilateral sciatica    Rationale for Evaluation and Treatment: Rehabilitation  THERAPY DIAG:  Abnormal posture  Radiculopathy, lumbar region  Muscle weakness (generalized)  Sciatica, right side  ONSET DATE: 3 months ago  SUBJECTIVE:  SUBJECTIVE STATEMENT: Laurajean notes right sided gluteal and leg pain to just above the knee (was distal to the knee) over the weekend.  HEP remains good and she notes less pain this week vs last week.  PERTINENT HISTORY:  Osteoporosis, HLD, obesity, relevant cardiac history  PAIN:  Are you having pain? Yes: NPRS scale: 0-7/10 this week, no longer as high as 8/10 Pain location: Right gluteal and leg just below the knee Pain description: Burning Aggravating factors: Worse during the day, with damp weather Relieving factors: Tylenol  PRECAUTIONS: Back  WEIGHT BEARING RESTRICTIONS: No  FALLS:  Has patient fallen in last 6 months? No  LIVING ENVIRONMENT: Lives with: lives with their family and lives with their spouse Lives in: House/apartment Stairs:  OK with a handrail Has following equipment at home: Grab bars  OCCUPATION: Retired  PLOF: Independent  PATIENT GOALS: Keep up the house  NEXT MD VISIT: 03/13/2023  OBJECTIVE:   DIAGNOSTIC FINDINGS:  No fracture or dislocation. Mild narrowing of interspaces L2-L5. Early grade 1 anterolisthesis L4-5. Facet DJD  bilaterally L3-S1. Aortic Atherosclerosis (ICD10-170.0). Cholecystectomy clips. Osteitis pubis.No fracture or dislocation. Mild narrowing of interspaces L2-L5.  1. No acute findings. 2. Multilevel lumbar degenerative changes as above.  PATIENT SURVEYS:  FOTO 59, risk adjusted 50 (Goal 68 in 10 visits)  SCREENING FOR RED FLAGS: Bowel or bladder incontinence: No Spinal tumors: No Cauda equina syndrome: No Compression fracture: No  COGNITION: Overall cognitive status: Within functional limits for tasks assessed     SENSATION: Yetta notes right gluteal pain that can travel just distal to her right knee  MUSCLE LENGTH: 02/22/2023: Hamstrings: Right 50 deg; Left 50 deg  Eval: Hamstrings: Right 40 deg; Left 40 deg  POSTURE: rounded shoulders, forward head, decreased lumbar lordosis, and flexed trunk    LUMBAR ROM:   AROM 02/01/2023  Flexion   Extension 5  Right lateral flexion   Left lateral flexion   Right rotation   Left rotation    (Blank rows = not tested)  LOWER EXTREMITY ROM:     Passive  Left/Right 02/01/2023 Left/Right 02/01/2023  Hip flexion 95/90 110/100  Hip extension    Hip abduction    Hip adduction    Hip internal rotation 10/10 12/12  Hip external rotation 27/22 34/33  Knee flexion    Knee extension    Ankle dorsiflexion    Ankle plantarflexion    Ankle inversion    Ankle eversion     (Blank rows = not tested)  LOWER EXTREMITY STRENGTH:    STRENGTH 02/01/2023   Hip flexion    Hip extension    Hip abduction    Hip adduction    Hip internal rotation    Hip external rotation    Knee flexion    Knee extension    Ankle dorsiflexion    Ankle plantarflexion    Ankle inversion    Ankle eversion    Lumbar extension Deferred secondary to inability to assume test postures    (Blank rows = not tested)  GAIT: Distance walked: Within the office, approximately 100 feet Assistive device utilized: None Level of assistance: Complete  Independence Comments: Significant for forward flexion at the hips  TODAY'S TREATMENT:  DATE:  02/22/2023 Standing trunk extension AROM 10 x 3 seconds Shoulder blade pinches 10 x 5 seconds Supine single knee-to-chest with other leg straight 5 x 20 seconds Supine hamstrings stretch with other leg straight 5 x 20 seconds Figure 4 stretch 5 x 20 seconds Yoga bridge 2 sets of 10 x 5 seconds Hip hike at counter top 10X 3 seconds  Functional Activities: Sit to stand with slow eccentrics 5X 3 seconds (with shoulder blade pinch and lumbar extension when standing); review of correct lumbar roll use; log roll for bed mobility   02/17/2023 Standing trunk extension AROM 10 x 3 seconds Shoulder blade pinches 10 x 5 seconds Supine single knee-to-chest with other leg straight 5 x 20 seconds Supine hamstrings stretch with other leg straight 5 x 20 seconds Figure 4 stretch 5 x 20 seconds Yoga bridge 2 sets of 10 x 5 seconds Hip hike at counter top 10X 3 seconds  Functional Activities: Sit to stand with slow eccentrics 5X 3 seconds (with shoulder blade pinch and lumbar extension when standing); review of correct lumbar roll use; log roll   02/15/2023 Standing trunk extension AROM 10 x 3 seconds Shoulder blade pinches 10 x 5 seconds Supine single knee-to-chest with other leg straight 5 x 20 seconds Supine hamstrings stretch with other leg straight 5 x 20 seconds Figure 4 stretch 5 x 20 seconds Yoga bridge 10 x 5 seconds Hip hike at counter top 10X 3 seconds  Functional Activities: Sit to stand with slow eccentrics 5X 3 seconds (with shoulder blade pinch and lumbar extension when standing); review of disc pressures in various positions chart; review of correct lumbar roll use; log roll and reviewed the importance of avoiding flexion   PATIENT EDUCATION:  Education details:  See above Person educated: Patient Education method: Programmer, multimedia, Demonstration, Tactile cues, Verbal cues, and Handouts Education comprehension: verbalized understanding, returned demonstration, verbal cues required, tactile cues required, and needs further education  HOME EXERCISE PROGRAM: [id number]  ASSESSMENT:  CLINICAL IMPRESSION: Jaylah continues her good HEP compliance.  Pain is noticeably improved over the past few days.  A flexed posture, limited trunk extension AROM, tight hip musculature and weak postural and lumbar muscles remain the focus of continued work.  Her prognosis is good with the recommended POC.  OBJECTIVE IMPAIRMENTS: decreased activity tolerance, decreased endurance, decreased knowledge of condition, difficulty walking, decreased ROM, decreased strength, decreased safety awareness, impaired perceived functional ability, increased muscle spasms, impaired flexibility, improper body mechanics, postural dysfunction, obesity, and pain.   ACTIVITY LIMITATIONS: carrying, lifting, bending, standing, bed mobility, and locomotion level  PARTICIPATION LIMITATIONS: meal prep, cleaning, laundry, and community activity  PERSONAL FACTORS: Osteoporosis, HLD, obesity, relevant cardiac history are also affecting patient's functional outcome.   REHAB POTENTIAL: Good  CLINICAL DECISION MAKING: Stable/uncomplicated  EVALUATION COMPLEXITY: Low   GOALS: Goals reviewed with patient? Yes  SHORT TERM GOALS: Target date: 03/01/2023  Marybella will be independent with her day 1 home exercise program Baseline: Started 02/01/2023 Goal status: Met 02/22/2023  2.  Improve standing trunk extension AROM to 10 degrees Baseline: 5 degrees Goal status: INITIAL  3.  Airyonna will be able to demonstrate correct sitting and standing posture Baseline: Unable at evaluation Goal status: Met 02/22/2023  4.  Malaiya will report and demonstrate improved body mechanics with household chores and bed  mobility Baseline: Based on observation and patient self-report, this will need to be addressed Goal status: On Going 02/22/2023   LONG TERM GOALS: Target date: 03/29/2023  Improve  FOTO to 68 in 10 visits Baseline: 59, risk adjusted 50 Goal status: INITIAL  2.  Improve right gluteal and right leg pain to consistently 0-2 out of 10 on the numeric pain rating scale Baseline: Symptoms require pain medication and can be 4+ out of 10 Goal status: On Going 02/17/2023  3.  Improve bilateral lower extremity flexibility for hip flexors to 100 degrees, hamstrings to 40 degrees and hip external rotation to 40 degrees Baseline: 95/90; 40/40; 27/22 Goal status: INITIAL  4.  Improve Shanee's spine strength as assessed by FOTO scores, pain scores and objective testing Baseline: Deferred at examination due to difficulty with test postures Goal status: INITIAL  5.  Oktober will be independent with her long-term home exercise program at discharge Baseline: Started 02/01/2023 Goal status: INITIAL  PLAN:  PT FREQUENCY: 1-2x/week  PT DURATION: 8 weeks  PLANNED INTERVENTIONS: Therapeutic exercises, Therapeutic activity, Neuromuscular re-education, Gait training, Patient/Family education, Self Care, Dry Needling, Cryotherapy, Traction, and Manual therapy.  PLAN FOR NEXT SESSION: Review home exercise program, practical posture and body mechanics work for ADLs, progress scapular and low back strength.  Was she able to cut back on Tylenol the last few days?   Cherlyn Cushing, PT, MPT 02/22/2023, 5:01 PM

## 2023-02-24 ENCOUNTER — Encounter: Payer: Self-pay | Admitting: Rehabilitative and Restorative Service Providers"

## 2023-02-24 ENCOUNTER — Ambulatory Visit (INDEPENDENT_AMBULATORY_CARE_PROVIDER_SITE_OTHER): Payer: Medicare Other | Admitting: Rehabilitative and Restorative Service Providers"

## 2023-02-24 DIAGNOSIS — M5431 Sciatica, right side: Secondary | ICD-10-CM | POA: Diagnosis not present

## 2023-02-24 DIAGNOSIS — M6281 Muscle weakness (generalized): Secondary | ICD-10-CM | POA: Diagnosis not present

## 2023-02-24 DIAGNOSIS — R293 Abnormal posture: Secondary | ICD-10-CM

## 2023-02-24 DIAGNOSIS — M5416 Radiculopathy, lumbar region: Secondary | ICD-10-CM | POA: Diagnosis not present

## 2023-02-24 NOTE — Therapy (Signed)
OUTPATIENT PHYSICAL THERAPY THORACOLUMBAR TREATMENT   Patient Name: Lateisha Deitz MRN: [id number] DOB:1941/02/03, 82 y.o., female Today's Date: 02/24/2023  END OF SESSION:  PT End of Session - 02/24/23 1115     Visit Number 5    Number of Visits 12    Date for PT Re-Evaluation 03/29/23    Authorization - Visit Number 5    Progress Note Due on Visit 10    PT Start Time 1100    PT Stop Time 1140    PT Time Calculation (min) 40 min    Activity Tolerance Patient tolerated treatment well;No increased pain    Behavior During Therapy WFL for tasks assessed/performed             Past Medical History:  Diagnosis Date   Cancer of skin of scalp    2023   Disorder of bone and cartilage, unspecified    Disorders of bursae and tendons in shoulder region, unspecified    Encounter for long-term (current) use of other medications    Esophageal reflux    Hyperosmolality and/or hypernatremia    Osteoporosis, unspecified    Other and unspecified hyperlipidemia    Pain in joint, shoulder region    Pain in limb    Palpitations    Pure hypercholesterolemia    Reflux esophagitis    Tachycardia, unspecified    Unspecified essential hypertension    Unspecified urinary incontinence    Past Surgical History:  Procedure Laterality Date   APPENDECTOMY  09/12/1953   Bone Density  08/14/2012   CATARACT EXTRACTION Bilateral 09/12/2008   CHOLECYSTECTOMY  09/12/2002   COLONOSCOPY  03/31/2009   Diverticulosis, Dr.John Madilyn Fireman    DIAGNOSTIC MAMMOGRAM     LAPAROSCOPY N/A 02/15/2018   Procedure: LAPAROSCOPY DIAGNOSTIC, LYSIS OF ADHESION;  Surgeon: Axel Filler, MD;  Location: [facility];  Service: General;  Laterality: N/A;   VARICOSE VEIN SURGERY Left    Summer 2022, Dr.Featherson ([facility])   Patient Active Problem List   Diagnosis Date Noted   Obesity (BMI 30.0-34.9) 01/30/2017   LBBB (left bundle branch block) 08/27/2014   Osteopenia 07/23/2014   Intraventricular conduction delay  07/23/2014   Intraventricular block 07/23/2014   Sigmoid diverticulosis 07/23/2014   Benign hypertensive heart disease without heart failure 07/03/2013   GERD (gastroesophageal reflux disease) 07/03/2013   Hyperlipidemia with target LDL less than 130 07/03/2013    PCP: Sharon Seller, NP  REFERRING PROVIDER: Sharon Seller, NP  REFERRING DIAG:  Diagnosis  M54.42,M54.41 (ICD-10-CM) - Acute right-sided low back pain with bilateral sciatica    Rationale for Evaluation and Treatment: Rehabilitation  THERAPY DIAG:  Abnormal posture  Radiculopathy, lumbar region  Muscle weakness (generalized)  Sciatica, right side  ONSET DATE: 3 months ago  SUBJECTIVE:  SUBJECTIVE STATEMENT: Debbe notes her symptoms have been mostly right gluteal  since Wednesday.  No leg pain since Wednesday.  HEP remains good and she notes less pain this week vs last week.  PERTINENT HISTORY:  Osteoporosis, HLD, obesity, relevant cardiac history  PAIN:  Are you having pain? Yes: NPRS scale: 0-6/10 since Wednesday, no longer as high as 8/10 Pain location: Right gluteal and leg just below the knee Pain description: Burning Aggravating factors: Worse during the day, with damp weather Relieving factors: Tylenol  PRECAUTIONS: Back  WEIGHT BEARING RESTRICTIONS: No  FALLS:  Has patient fallen in last 6 months? No  LIVING ENVIRONMENT: Lives with: lives with their family and lives with their spouse Lives in: House/apartment Stairs:  OK with a handrail Has following equipment at home: Grab bars  OCCUPATION: Retired  PLOF: Independent  PATIENT GOALS: Keep up the house  NEXT MD VISIT: 03/13/2023  OBJECTIVE:   DIAGNOSTIC FINDINGS:  No fracture or dislocation. Mild narrowing of interspaces L2-L5. Early grade 1  anterolisthesis L4-5. Facet DJD bilaterally L3-S1. Aortic Atherosclerosis (ICD10-170.0). Cholecystectomy clips. Osteitis pubis.No fracture or dislocation. Mild narrowing of interspaces L2-L5.  1. No acute findings. 2. Multilevel lumbar degenerative changes as above.  PATIENT SURVEYS:  FOTO 59, risk adjusted 50 (Goal 68 in 10 visits)  SCREENING FOR RED FLAGS: Bowel or bladder incontinence: No Spinal tumors: No Cauda equina syndrome: No Compression fracture: No  COGNITION: Overall cognitive status: Within functional limits for tasks assessed     SENSATION: Fiamma notes right gluteal pain that can travel just distal to her right knee  MUSCLE LENGTH: 02/22/2023: Hamstrings: Right 50 deg; Left 50 deg  Eval: Hamstrings: Right 40 deg; Left 40 deg  POSTURE: rounded shoulders, forward head, decreased lumbar lordosis, and flexed trunk    LUMBAR ROM:   AROM 02/01/2023  Flexion   Extension 5  Right lateral flexion   Left lateral flexion   Right rotation   Left rotation    (Blank rows = not tested)  LOWER EXTREMITY ROM:     Passive  Left/Right 02/01/2023 Left/Right 02/01/2023  Hip flexion 95/90 110/100  Hip extension    Hip abduction    Hip adduction    Hip internal rotation 10/10 12/12  Hip external rotation 27/22 34/33  Knee flexion    Knee extension    Ankle dorsiflexion    Ankle plantarflexion    Ankle inversion    Ankle eversion     (Blank rows = not tested)  LOWER EXTREMITY STRENGTH:    STRENGTH 02/01/2023   Hip flexion    Hip extension    Hip abduction    Hip adduction    Hip internal rotation    Hip external rotation    Knee flexion    Knee extension    Ankle dorsiflexion    Ankle plantarflexion    Ankle inversion    Ankle eversion    Lumbar extension Deferred secondary to inability to assume test postures    (Blank rows = not tested)  GAIT: Distance walked: Within the office, approximately 100 feet Assistive device utilized: None Level of  assistance: Complete Independence Comments: Significant for forward flexion at the hips  TODAY'S TREATMENT:  DATE:  02/24/2023 Standing trunk extension AROM 10 x 3 seconds Shoulder blade pinches 10 x 5 seconds Supine single knee-to-chest with other leg straight 5 x 20 seconds Supine hamstrings stretch with other leg straight 5 x 20 seconds Figure 4 stretch 5 x 20 seconds Yoga bridge 2 sets of 10 x 5 seconds Prone alternating leg lift 2 sets of 10 for 3 seconds Hip hike at counter top 10X 3 seconds  Functional Activities: Sit to stand with slow eccentrics 5X 3 seconds (with shoulder blade pinch and lumbar extension when standing); review of correct lumbar roll use; log roll for bed mobility; practical golfers and diagonal squat lifts; dishes; vacuuming and sweeping mechanics   02/22/2023 Standing trunk extension AROM 10 x 3 seconds Shoulder blade pinches 10 x 5 seconds Supine single knee-to-chest with other leg straight 5 x 20 seconds Supine hamstrings stretch with other leg straight 5 x 20 seconds Figure 4 stretch 5 x 20 seconds Yoga bridge 2 sets of 10 x 5 seconds Hip hike at counter top 10X 3 seconds  Functional Activities: Sit to stand with slow eccentrics 5X 3 seconds (with shoulder blade pinch and lumbar extension when standing); review of correct lumbar roll use; log roll for bed mobility   02/17/2023 Standing trunk extension AROM 10 x 3 seconds Shoulder blade pinches 10 x 5 seconds Supine single knee-to-chest with other leg straight 5 x 20 seconds Supine hamstrings stretch with other leg straight 5 x 20 seconds Figure 4 stretch 5 x 20 seconds Yoga bridge 2 sets of 10 x 5 seconds Hip hike at counter top 10X 3 seconds  Functional Activities: Sit to stand with slow eccentrics 5X 3 seconds (with shoulder blade pinch and lumbar extension when standing);  review of correct lumbar roll use; log roll   PATIENT EDUCATION:  Education details: See above Person educated: Patient Education method: Programmer, multimedia, Demonstration, Tactile cues, Verbal cues, and Handouts Education comprehension: verbalized understanding, returned demonstration, verbal cues required, tactile cues required, and needs further education  HOME EXERCISE PROGRAM: [id number]  ASSESSMENT:  CLINICAL IMPRESSION: Wallis notes significant progress this week.  Symptoms are more proximal and less severe.  Endurance with standing and walking is slowly improving.  We discussed further cutting back her tylenol use (currently only at night, was during the day last week as well) if symptoms keep improving.  We also discussed dropping exercises to 1 x per day if she does well with no tylenol.  Added a lumbar strengthening activity today and we will assess her progress next Thursday.  OBJECTIVE IMPAIRMENTS: decreased activity tolerance, decreased endurance, decreased knowledge of condition, difficulty walking, decreased ROM, decreased strength, decreased safety awareness, impaired perceived functional ability, increased muscle spasms, impaired flexibility, improper body mechanics, postural dysfunction, obesity, and pain.   ACTIVITY LIMITATIONS: carrying, lifting, bending, standing, bed mobility, and locomotion level  PARTICIPATION LIMITATIONS: meal prep, cleaning, laundry, and community activity  PERSONAL FACTORS: Osteoporosis, HLD, obesity, relevant cardiac history are also affecting patient's functional outcome.   REHAB POTENTIAL: Good  CLINICAL DECISION MAKING: Stable/uncomplicated  EVALUATION COMPLEXITY: Low   GOALS: Goals reviewed with patient? Yes  SHORT TERM GOALS: Target date: 03/01/2023  Shirletta will be independent with her day 1 home exercise program Baseline: Started 02/01/2023 Goal status: Met 02/22/2023  2.  Improve standing trunk extension AROM to 10 degrees Baseline: 5  degrees Goal status: INITIAL  3.  Jowanna will be able to demonstrate correct sitting and standing posture Baseline: Unable at evaluation Goal status: Met  02/22/2023  4.  Alejandria will report and demonstrate improved body mechanics with household chores and bed mobility Baseline: Based on observation and patient self-report, this will need to be addressed Goal status: On Going 02/24/2023   LONG TERM GOALS: Target date: 03/29/2023  Improve FOTO to 68 in 10 visits Baseline: 59, risk adjusted 50 Goal status: INITIAL  2.  Improve right gluteal and right leg pain to consistently 0-2 out of 10 on the numeric pain rating scale Baseline: Symptoms require pain medication and can be 4+ out of 10 Goal status: On Going 02/24/2023  3.  Improve bilateral lower extremity flexibility for hip flexors to 100 degrees, hamstrings to 40 degrees and hip external rotation to 40 degrees Baseline: 95/90; 40/40; 27/22 Goal status: INITIAL  4.  Improve Niquita's spine strength as assessed by FOTO scores, pain scores and objective testing Baseline: Deferred at examination due to difficulty with test postures Goal status: INITIAL  5.  Mariely will be independent with her long-term home exercise program at discharge Baseline: Started 02/24/2023 Goal status: INITIAL  PLAN:  PT FREQUENCY: 1-2x/week  PT DURATION: 8 weeks  PLANNED INTERVENTIONS: Therapeutic exercises, Therapeutic activity, Neuromuscular re-education, Gait training, Patient/Family education, Self Care, Dry Needling, Cryotherapy, Traction, and Manual therapy.  PLAN FOR NEXT SESSION: How did prone strengthening go?  Was she able to cut back on Tylenol?  FOTO and at least some objective measures to assess LTGs.   Cherlyn Cushing, PT, MPT 02/24/2023, 11:45 AM

## 2023-03-02 ENCOUNTER — Encounter: Payer: Self-pay | Admitting: Rehabilitative and Restorative Service Providers"

## 2023-03-02 ENCOUNTER — Ambulatory Visit (INDEPENDENT_AMBULATORY_CARE_PROVIDER_SITE_OTHER): Payer: Medicare Other | Admitting: Rehabilitative and Restorative Service Providers"

## 2023-03-02 DIAGNOSIS — M5431 Sciatica, right side: Secondary | ICD-10-CM | POA: Diagnosis not present

## 2023-03-02 DIAGNOSIS — M5416 Radiculopathy, lumbar region: Secondary | ICD-10-CM | POA: Diagnosis not present

## 2023-03-02 DIAGNOSIS — R293 Abnormal posture: Secondary | ICD-10-CM | POA: Diagnosis not present

## 2023-03-02 DIAGNOSIS — M6281 Muscle weakness (generalized): Secondary | ICD-10-CM

## 2023-03-02 NOTE — Therapy (Signed)
OUTPATIENT PHYSICAL THERAPY THORACOLUMBAR TREATMENT/DISCHARGE  PHYSICAL THERAPY DISCHARGE SUMMARY  Visits from Start of Care: 6  Current functional level related to goals / functional outcomes: See note   Remaining deficits: See note   Education / Equipment: Updated HEP   Patient agrees to discharge. Patient goals were met. Patient is being discharged due to meeting the stated rehab goals.   Patient Name: Brooke Cox MRN: [id number] DOB:[birth year], 82 y.o., female Today's Date: 03/02/2023  END OF SESSION:  PT End of Session - 03/02/23 0931     Visit Number 6    Number of Visits 12    Date for PT Re-Evaluation 03/29/23    Authorization - Visit Number 6    Progress Note Due on Visit 10    PT Start Time 0930    PT Stop Time 1010    PT Time Calculation (min) 40 min    Activity Tolerance Patient tolerated treatment well;No increased pain    Behavior During Therapy WFL for tasks assessed/performed              Past Medical History:  Diagnosis Date   Cancer of skin of scalp    2023   Disorder of bone and cartilage, unspecified    Disorders of bursae and tendons in shoulder region, unspecified    Encounter for long-term (current) use of other medications    Esophageal reflux    Hyperosmolality and/or hypernatremia    Osteoporosis, unspecified    Other and unspecified hyperlipidemia    Pain in joint, shoulder region    Pain in limb    Palpitations    Pure hypercholesterolemia    Reflux esophagitis    Tachycardia, unspecified    Unspecified essential hypertension    Unspecified urinary incontinence    Past Surgical History:  Procedure Laterality Date   APPENDECTOMY  09/12/1953   Bone Density  08/14/2012   CATARACT EXTRACTION Bilateral 09/12/2008   CHOLECYSTECTOMY  09/12/2002   COLONOSCOPY  03/31/2009   Diverticulosis, Dr.John Madilyn Fireman    DIAGNOSTIC MAMMOGRAM     LAPAROSCOPY N/A 02/15/2018   Procedure: LAPAROSCOPY DIAGNOSTIC, LYSIS OF ADHESION;  Surgeon:  Axel Filler, MD;  Location: [facility];  Service: General;  Laterality: N/A;   VARICOSE VEIN SURGERY Left    Summer 2022, Dr.Featherson ([facility])   Patient Active Problem List   Diagnosis Date Noted   Obesity (BMI 30.0-34.9) 01/30/2017   LBBB (left bundle branch block) 08/27/2014   Osteopenia 07/23/2014   Intraventricular conduction delay 07/23/2014   Intraventricular block 07/23/2014   Sigmoid diverticulosis 07/23/2014   Benign hypertensive heart disease without heart failure 07/03/2013   GERD (gastroesophageal reflux disease) 07/03/2013   Hyperlipidemia with target LDL less than 130 07/03/2013    PCP: Sharon Seller, NP  REFERRING PROVIDER: Sharon Seller, NP  REFERRING DIAG:  Diagnosis  M54.42,M54.41 (ICD-10-CM) - Acute right-sided low back pain with bilateral sciatica    Rationale for Evaluation and Treatment: Rehabilitation  THERAPY DIAG:  Abnormal posture  Radiculopathy, lumbar region  Muscle weakness (generalized)  Sciatica, right side  ONSET DATE: 3 months ago  SUBJECTIVE:  SUBJECTIVE STATEMENT: Brooke Cox notes her symptoms have been absent since her last PT visit.  No back, hip or leg pain since her last PT visit.  HEP compliance remains good at 1 x per day and she notes no tylenol this week was needed.  PERTINENT HISTORY:  Osteoporosis, HLD, obesity, relevant cardiac history  PAIN:  Are you having pain? Yes: NPRS scale: 0/10 since her last PT visit, no longer as high as 8/10 Pain location: Right gluteal and leg just below the knee Pain description: Burning Aggravating factors: Worse during the day, with damp weather Relieving factors: Tylenol  PRECAUTIONS: Back  WEIGHT BEARING RESTRICTIONS: No  FALLS:  Has patient fallen in last 6 months? No  LIVING  ENVIRONMENT: Lives with: lives with their family and lives with their spouse Lives in: House/apartment Stairs:  OK with a handrail Has following equipment at home: Grab bars  OCCUPATION: Retired  PLOF: Independent  PATIENT GOALS: Keep up the house  NEXT MD VISIT: 03/13/2023  OBJECTIVE:   DIAGNOSTIC FINDINGS:  No fracture or dislocation. Mild narrowing of interspaces L2-L5. Early grade 1 anterolisthesis L4-5. Facet DJD bilaterally L3-S1. Aortic Atherosclerosis (ICD10-170.0). Cholecystectomy clips. Osteitis pubis.No fracture or dislocation. Mild narrowing of interspaces L2-L5.  1. No acute findings. 2. Multilevel lumbar degenerative changes as above.  PATIENT SURVEYS:  03/02/2023 FOTO 83 (Goal met)  Eval FOTO 59, risk adjusted 50 (Goal 68 in 10 visits)  SCREENING FOR RED FLAGS: Bowel or bladder incontinence: No Spinal tumors: No Cauda equina syndrome: No Compression fracture: No  COGNITION: Overall cognitive status: Within functional limits for tasks assessed     SENSATION: Brooke Cox notes right gluteal pain that can travel just distal to her right knee  MUSCLE LENGTH: 02/22/2023: Hamstrings: Right 50 deg; Left 50 deg  Eval: Hamstrings: Right 40 deg; Left 40 deg  POSTURE: rounded shoulders, forward head, decreased lumbar lordosis, and flexed trunk    LUMBAR ROM:   AROM 02/01/2023 03/01/2024  Flexion    Extension 5 15  Right lateral flexion    Left lateral flexion    Right rotation    Left rotation     (Blank rows = not tested)  LOWER EXTREMITY ROM:     Passive  Left/Right 02/01/2023 Left/Right 02/22/2023  Hip flexion 95/90 110/100  Hip extension    Hip abduction    Hip adduction    Hip internal rotation 10/10 12/12  Hip external rotation 27/22 34/33  Knee flexion    Knee extension    Ankle dorsiflexion    Ankle plantarflexion    Ankle inversion    Ankle eversion     (Blank rows = not tested)  LOWER EXTREMITY STRENGTH:    STRENGTH 02/01/2023   Hip  flexion    Hip extension    Hip abduction    Hip adduction    Hip internal rotation    Hip external rotation    Knee flexion    Knee extension    Ankle dorsiflexion    Ankle plantarflexion    Ankle inversion    Ankle eversion    Lumbar extension Deferred secondary to inability to assume test postures    (Blank rows = not tested)  GAIT: Distance walked: Within the office, approximately 100 feet Assistive device utilized: None Level of assistance: Complete Independence Comments: Significant for forward flexion at the hips  TODAY'S TREATMENT:  DATE:  03/02/2023 Standing trunk extension AROM 10 x 3 seconds Shoulder blade pinches 10 x 5 seconds Supine single knee-to-chest with other leg straight 4 x 20 seconds Supine hamstrings stretch with other leg straight 4 x 20 seconds Figure 4 stretch 4 x 20 seconds Yoga bridge 2 sets of 10 x 5 seconds Prone alternating leg lift 2 sets of 10 for 3 seconds Hip hike at counter top 10X 3 seconds  Functional Activities: Review HEP and home walking program for DC into independent rehabilitation Sit to stand with slow eccentrics 5X 3 seconds (with shoulder blade pinch and lumbar extension when standing); quick review of correct lumbar roll use; log roll for bed mobility; practical golfers and diagonal squat lifts; dishes; vacuuming and sweeping mechanics   02/24/2023 Standing trunk extension AROM 10 x 3 seconds Shoulder blade pinches 10 x 5 seconds Supine single knee-to-chest with other leg straight 5 x 20 seconds Supine hamstrings stretch with other leg straight 5 x 20 seconds Figure 4 stretch 5 x 20 seconds Yoga bridge 2 sets of 10 x 5 seconds Prone alternating leg lift 2 sets of 10 for 3 seconds Hip hike at counter top 10X 3 seconds  Functional Activities: Sit to stand with slow eccentrics 5X 3 seconds (with shoulder  blade pinch and lumbar extension when standing); review of correct lumbar roll use; log roll for bed mobility; practical golfers and diagonal squat lifts; dishes; vacuuming and sweeping mechanics   02/22/2023 Standing trunk extension AROM 10 x 3 seconds Shoulder blade pinches 10 x 5 seconds Supine single knee-to-chest with other leg straight 5 x 20 seconds Supine hamstrings stretch with other leg straight 5 x 20 seconds Figure 4 stretch 5 x 20 seconds Yoga bridge 2 sets of 10 x 5 seconds Hip hike at counter top 10X 3 seconds  Functional Activities: Sit to stand with slow eccentrics 5X 3 seconds (with shoulder blade pinch and lumbar extension when standing); review of correct lumbar roll use; log roll for bed mobility   PATIENT EDUCATION:  Education details: See above Person educated: Patient Education method: Explanation, Demonstration, Tactile cues, Verbal cues, and Handouts Education comprehension: verbalized understanding, returned demonstration, verbal cues required, tactile cues required, and needs further education  HOME EXERCISE PROGRAM: [id number]  ASSESSMENT:  CLINICAL IMPRESSION: Brooke Cox notes significant progress over the past 1-2 weeks.  Symptoms are now absent and no pain medication was needed this week.  Endurance with standing and walking is no longer limited by pain.  She has cut back her tylenol use and has not needed it in almost a week.  We reviewed her DC HEP and she appears ready for independent rehabilitation.  OBJECTIVE IMPAIRMENTS: decreased activity tolerance, decreased endurance, decreased knowledge of condition, difficulty walking, decreased ROM, decreased strength, decreased safety awareness, impaired perceived functional ability, increased muscle spasms, impaired flexibility, improper body mechanics, postural dysfunction, obesity, and pain.   ACTIVITY LIMITATIONS: carrying, lifting, bending, standing, bed mobility, and locomotion level  PARTICIPATION  LIMITATIONS: meal prep, cleaning, laundry, and community activity  PERSONAL FACTORS: Osteoporosis, HLD, obesity, relevant cardiac history are also affecting patient's functional outcome.   REHAB POTENTIAL: Good  CLINICAL DECISION MAKING: Stable/uncomplicated  EVALUATION COMPLEXITY: Low   GOALS: Goals reviewed with patient? Yes  SHORT TERM GOALS: Target date: 03/01/2023  Brooke Cox will be independent with her day 1 home exercise program Baseline: Started 02/01/2023 Goal status: Met 02/22/2023  2.  Improve standing trunk extension AROM to 10 degrees Baseline: 5 degrees Goal status: Met 03/02/2023  3.  Brooke Cox will be able to demonstrate correct sitting and standing posture Baseline: Unable at evaluation Goal status: Met 02/22/2023  4.  Brooke Cox will report and demonstrate improved body mechanics with household chores and bed mobility Baseline: Based on observation and patient self-report, this will need to be addressed Goal status: Met 03/02/2023   LONG TERM GOALS: Target date: 03/29/2023  Improve FOTO to 68 in 10 visits Baseline: 59, risk adjusted 50 Goal status: Met 03/02/2023  2.  Improve right gluteal and right leg pain to consistently 0-2 out of 10 on the numeric pain rating scale Baseline: Symptoms require pain medication and can be 4+ out of 10 Goal status: Met 03/02/2023  3.  Improve bilateral lower extremity flexibility for hip flexors to 100 degrees, hamstrings to 40 degrees and hip external rotation to 40 degrees Baseline: 95/90; 40/40; 27/22 Goal status: Partially Met 03/02/2023  4.  Improve Brooke Cox's spine strength as assessed by FOTO scores, pain scores and objective testing Baseline: Deferred at examination due to difficulty with test postures Goal status: Met 03/02/2023  5.  Brooke Cox will be independent with her long-term home exercise program at discharge Baseline: Started 02/24/2023 Goal status: Met 03/02/2023  PLAN:  PT FREQUENCY: DC  PT DURATION: DC  PLANNED  INTERVENTIONS: Therapeutic exercises, Therapeutic activity, Neuromuscular re-education, Gait training, Patient/Family education, Self Care, Dry Needling, Cryotherapy, Traction, and Manual therapy.  PLAN FOR NEXT SESSION: DC   Cherlyn Cushing, PT, MPT 03/02/2023, 10:13 AM

## 2023-03-08 ENCOUNTER — Other Ambulatory Visit: Payer: Medicare Other

## 2023-03-08 DIAGNOSIS — M810 Age-related osteoporosis without current pathological fracture: Secondary | ICD-10-CM

## 2023-03-08 DIAGNOSIS — D509 Iron deficiency anemia, unspecified: Secondary | ICD-10-CM

## 2023-03-08 DIAGNOSIS — E785 Hyperlipidemia, unspecified: Secondary | ICD-10-CM

## 2023-03-08 LAB — CBC WITH DIFFERENTIAL/PLATELET
Basophils Absolute: 22 cells/uL (ref 0–200)
Basophils Relative: 0.4 %
Eosinophils Absolute: 154 cells/uL (ref 15–500)
HCT: 36.1 % (ref 35.0–45.0)
Hemoglobin: 12.1 g/dL (ref 11.7–15.5)
Lymphs Abs: 1364 cells/uL (ref 850–3900)
MCV: 92.1 fL (ref 80.0–100.0)
RBC: 3.92 10*6/uL (ref 3.80–5.10)
RDW: 12.8 % (ref 11.0–15.0)
Total Lymphocyte: 24.8 %

## 2023-03-09 ENCOUNTER — Encounter: Payer: Medicare Other | Admitting: Rehabilitative and Restorative Service Providers"

## 2023-03-09 LAB — COMPLETE METABOLIC PANEL WITH GFR
AG Ratio: 1.7 (calc) (ref 1.0–2.5)
ALT: 11 U/L (ref 6–29)
AST: 15 U/L (ref 10–35)
Albumin: 4 g/dL (ref 3.6–5.1)
Alkaline phosphatase (APISO): 47 U/L (ref 37–153)
BUN: 17 mg/dL (ref 7–25)
CO2: 28 mmol/L (ref 20–32)
Calcium: 10.6 mg/dL — ABNORMAL HIGH (ref 8.6–10.4)
Chloride: 105 mmol/L (ref 98–110)
Creat: 0.72 mg/dL (ref 0.60–0.95)
Globulin: 2.3 g/dL (calc) (ref 1.9–3.7)
Glucose, Bld: 83 mg/dL (ref 65–99)
Potassium: 4.4 mmol/L (ref 3.5–5.3)
Sodium: 140 mmol/L (ref 135–146)
Total Bilirubin: 0.6 mg/dL (ref 0.2–1.2)
Total Protein: 6.3 g/dL (ref 6.1–8.1)
eGFR: 82 mL/min/{1.73_m2} (ref >=60)

## 2023-03-09 LAB — LIPID PANEL
Cholesterol: 179 mg/dL (ref <200)
HDL: 61 mg/dL (ref >=50)
LDL Cholesterol (Calc): 94 mg/dL (calc)
Non-HDL Cholesterol (Calc): 118 mg/dL (calc) (ref <130)
Total CHOL/HDL Ratio: 2.9 (calc) (ref <5.0)
Triglycerides: 143 mg/dL (ref <150)

## 2023-03-09 LAB — CBC WITH DIFFERENTIAL/PLATELET
Absolute Monocytes: 413 cells/uL (ref 200–950)
Eosinophils Relative: 2.8 %
MCH: 30.9 pg (ref 27.0–33.0)
MCHC: 33.5 g/dL (ref 32.0–36.0)
MPV: 9.1 fL (ref 7.5–12.5)
Monocytes Relative: 7.5 %
Neutro Abs: 3548 cells/uL (ref 1500–7800)
Neutrophils Relative %: 64.5 %
Platelets: 271 10*3/uL (ref 140–400)
WBC: 5.5 10*3/uL (ref 3.8–10.8)

## 2023-03-10 ENCOUNTER — Encounter: Payer: Self-pay | Admitting: Nurse Practitioner

## 2023-03-13 ENCOUNTER — Ambulatory Visit: Payer: Medicare Other | Admitting: Nurse Practitioner

## 2023-03-13 ENCOUNTER — Encounter: Payer: Self-pay | Admitting: Nurse Practitioner

## 2023-03-13 VITALS — BP 118/76 | HR 67 | Temp 97.1°F | Ht <= 58 in | Wt 155.0 lb

## 2023-03-13 DIAGNOSIS — M810 Age-related osteoporosis without current pathological fracture: Secondary | ICD-10-CM

## 2023-03-13 DIAGNOSIS — I119 Hypertensive heart disease without heart failure: Secondary | ICD-10-CM

## 2023-03-13 DIAGNOSIS — E785 Hyperlipidemia, unspecified: Secondary | ICD-10-CM | POA: Diagnosis not present

## 2023-03-13 DIAGNOSIS — M5442 Lumbago with sciatica, left side: Secondary | ICD-10-CM | POA: Diagnosis not present

## 2023-03-13 DIAGNOSIS — M5441 Lumbago with sciatica, right side: Secondary | ICD-10-CM

## 2023-03-13 DIAGNOSIS — K219 Gastro-esophageal reflux disease without esophagitis: Secondary | ICD-10-CM

## 2023-03-13 DIAGNOSIS — D509 Iron deficiency anemia, unspecified: Secondary | ICD-10-CM

## 2023-03-13 NOTE — Progress Notes (Signed)
Careteam: Patient Care Team: Sharon Seller, NP as PCP - General (Geriatric Medicine) Vedia Coffer, Audrie Lia (Physician Assistant) Ernesto Rutherford, MD as Consulting Physician (Ophthalmology) Drema Halon, MD (Inactive) as Consulting Physician (Otolaryngology)  PLACE OF SERVICE:  [facility]  Advanced Directive information Does Patient Have a Medical Advance Directive?: Yes, Type of Advance Directive: Out of facility DNR (pink MOST or yellow form), Pre-existing out of facility DNR order (yellow form or pink MOST form): Pink MOST form placed in chart (order not valid for inpatient use);Yellow form placed in chart (order not valid for inpatient use), Does patient want to make changes to medical advance directive?: No - Patient declined  Allergies  Allergen Reactions   Shellfish Allergy Nausea And Vomiting    Chief Complaint  Patient presents with   Medical Management of Chronic Issues    6 month follow-up. Discuss need for covid boosters      HPI: Patient is a 82 y.o. female for follow up. Reports she is doing well.  Has no concerns or complaints today.   GERD improved on protonix, no ongoing symptoms  Back pain has improved with therapy.   Anemia controlled on iron twice weekly  Hyperlipidemia controlled on zocor with dietary modifications.  Review of Systems:  Review of Systems  Constitutional:  Negative for chills, fever and weight loss.  HENT:  Negative for tinnitus.   Respiratory:  Negative for cough, sputum production and shortness of breath.   Cardiovascular:  Negative for chest pain, palpitations and leg swelling.  Gastrointestinal:  Negative for abdominal pain, constipation, diarrhea and heartburn.  Genitourinary:  Negative for dysuria, frequency and urgency.  Musculoskeletal:  Negative for back pain, falls, joint pain and myalgias.  Skin: Negative.   Neurological:  Negative for dizziness and headaches.  Psychiatric/Behavioral:  Negative for  depression and memory loss. The patient does not have insomnia.     Past Medical History:  Diagnosis Date   Cancer of skin of scalp    2023   Disorder of bone and cartilage, unspecified    Disorders of bursae and tendons in shoulder region, unspecified    Encounter for long-term (current) use of other medications    Esophageal reflux    Hyperosmolality and/or hypernatremia    Osteoporosis, unspecified    Other and unspecified hyperlipidemia    Pain in joint, shoulder region    Pain in limb    Palpitations    Pure hypercholesterolemia    Reflux esophagitis    Tachycardia, unspecified    Unspecified essential hypertension    Unspecified urinary incontinence    Past Surgical History:  Procedure Laterality Date   APPENDECTOMY  09/12/1953   Bone Density  08/14/2012   CATARACT EXTRACTION Bilateral 09/12/2008   CHOLECYSTECTOMY  09/12/2002   COLONOSCOPY  03/31/2009   Diverticulosis, Dr.John Madilyn Fireman    DIAGNOSTIC MAMMOGRAM     LAPAROSCOPY N/A 02/15/2018   Procedure: LAPAROSCOPY DIAGNOSTIC, LYSIS OF ADHESION;  Surgeon: Axel Filler, MD;  Location: [facility];  Service: General;  Laterality: N/A;   VARICOSE VEIN SURGERY Left    Summer 2022, Dr.Featherson ([facility])   Social History:   reports that she has never smoked. She has never used smokeless tobacco. She reports that she does not drink alcohol and does not use drugs.  Family History  Problem Relation Age of Onset   Hypertension Mother    Diabetes Brother    Crohn's disease Neg Hx    Inflammatory bowel disease Neg Hx  Medications: Patient's Medications  New Prescriptions   No medications on file  Previous Medications   ACETAMINOPHEN (TYLENOL) 650 MG CR TABLET    Take 650 mg by mouth as needed.    ALENDRONATE (FOSAMAX) 70 MG TABLET    Take 1 tablet (70 mg total) by mouth every 7 (seven) days. Take with a full glass of water on an empty stomach.   ASPIRIN EC 81 MG TABLET    Take 81 mg by mouth daily.   ATENOLOL  (TENORMIN) 25 MG TABLET    TAKE 1 TABLET BY MOUTH EVERYDAY AT BEDTIME   BIOTIN 5000 MCG CAPS    Take by mouth daily.   CALCIUM-VITAMIN D (OSCAL WITH D) 500-5 MG-MCG TABLET    Take 1 tablet by mouth 2 (two) times daily.   CHOLECALCIFEROL (VITAMIN D) 2000 UNITS TABLET    Take 2,000 Units by mouth daily.    FERROUS SULFATE (IRON) 325 (65 FE) MG TABS    Take 1 tablet by mouth as directed. On Monday's and Friday's   PANTOPRAZOLE (PROTONIX) 40 MG TABLET    TAKE 1 TABLET BY MOUTH EVERY DAY   SIMVASTATIN (ZOCOR) 40 MG TABLET    TAKE 1 TABLET BY MOUTH DAILY AT 6:00PM  Modified Medications   No medications on file  Discontinued Medications   No medications on file    Physical Exam:  Vitals:   03/13/23 0813  BP: 118/76  Pulse: 67  Temp: (!) 97.1 F (36.2 C)  TempSrc: Temporal  SpO2: 97%  Weight: 155 lb (70.3 kg)  Height: 4\' 10"  (1.473 m)   Body mass index is 32.4 kg/m. Wt Readings from Last 3 Encounters:  03/13/23 155 lb (70.3 kg)  01/13/23 157 lb 3.2 oz (71.3 kg)  09/09/22 152 lb (68.9 kg)    Physical Exam Constitutional:      General: She is not in acute distress.    Appearance: She is well-developed. She is not diaphoretic.  HENT:     Head: Normocephalic and atraumatic.     Mouth/Throat:     Pharynx: No oropharyngeal exudate.  Eyes:     Conjunctiva/sclera: Conjunctivae normal.     Pupils: Pupils are equal, round, and reactive to light.  Cardiovascular:     Rate and Rhythm: Normal rate and regular rhythm.     Heart sounds: Normal heart sounds.  Pulmonary:     Effort: Pulmonary effort is normal.     Breath sounds: Normal breath sounds.  Abdominal:     General: Bowel sounds are normal.     Palpations: Abdomen is soft.  Musculoskeletal:     Cervical back: Normal range of motion and neck supple.     Right lower leg: No edema.     Left lower leg: No edema.  Skin:    General: Skin is warm and dry.  Neurological:     Mental Status: She is alert.  Psychiatric:         Mood and Affect: Mood normal.     Labs reviewed: Basic Metabolic Panel: Recent Labs    09/06/22 0819 03/08/23 0802  NA 142 140  K 4.3 4.4  CL 107 105  CO2 23 28  GLUCOSE 79 83  BUN 16 17  CREATININE 0.66 0.72  CALCIUM 10.2 10.6*   Liver Function Tests: Recent Labs    09/06/22 0819 03/08/23 0802  AST 15 15  ALT 11 11  BILITOT 0.5 0.6  PROT 6.0* 6.3   No results for input(s): "  LIPASE", "AMYLASE" in the last 8760 hours. No results for input(s): "AMMONIA" in the last 8760 hours. CBC: Recent Labs    09/06/22 0819 03/08/23 0802  WBC 4.4 5.5  NEUTROABS 2,649 3,548  HGB 12.1 12.1  HCT 35.3 36.1  MCV 94.1 92.1  PLT 240 271   Lipid Panel: Recent Labs    03/08/23 0802  CHOL 179  HDL 61  LDLCALC 94  TRIG 143  CHOLHDL 2.9   TSH: No results for input(s): "TSH" in the last 8760 hours. A1C: No results found for: "HGBA1C"   Assessment/Plan 1. Acute right-sided low back pain with bilateral sciatica -improved at this time, to continue exercises per PT  2. Hyperlipidemia with target LDL less than 130 Continues on zocor with dietary modifications - COMPLETE METABOLIC PANEL WITH GFR; Future - CBC with Differential/Platelet; Future  3. Iron deficiency anemia, unspecified iron deficiency anemia type Continues on iron twice weekly - CBC with Differential/Platelet; Future  4. Age-related osteoporosis without current pathological fracture Calcium level slightly high. Will have her reduce supplement to daily and continue vit d with weight bearing exercises - COMPLETE METABOLIC PANEL WITH GFR; Future  5. Benign hypertensive heart disease without heart failure -Blood pressure well controlled, goal bp <140/90 Continue current medications and dietary modifications follow metabolic panel - COMPLETE METABOLIC PANEL WITH GFR; Future - CBC with Differential/Platelet; Future  6. Gastroesophageal reflux disease without esophagitis Controlled on protonix.    Return in  about 6 months (around 09/13/2023) for routine follow up, labs prior to visit. Janene Harvey. Biagio [facility] [phone]

## 2023-03-13 NOTE — Patient Instructions (Signed)
Decrease calcium supplement to once daily

## 2023-04-19 ENCOUNTER — Other Ambulatory Visit: Payer: Self-pay | Admitting: Adult Health

## 2023-04-19 DIAGNOSIS — M810 Age-related osteoporosis without current pathological fracture: Secondary | ICD-10-CM

## 2023-04-27 ENCOUNTER — Other Ambulatory Visit: Payer: Self-pay | Admitting: Nurse Practitioner

## 2023-04-27 DIAGNOSIS — K219 Gastro-esophageal reflux disease without esophagitis: Secondary | ICD-10-CM

## 2023-06-13 LAB — HM MAMMOGRAPHY

## 2023-07-05 ENCOUNTER — Ambulatory Visit (INDEPENDENT_AMBULATORY_CARE_PROVIDER_SITE_OTHER): Payer: Medicare Other

## 2023-07-05 DIAGNOSIS — Z23 Encounter for immunization: Secondary | ICD-10-CM

## 2023-08-13 ENCOUNTER — Other Ambulatory Visit: Payer: Self-pay | Admitting: Nurse Practitioner

## 2023-08-13 DIAGNOSIS — E785 Hyperlipidemia, unspecified: Secondary | ICD-10-CM

## 2023-08-13 DIAGNOSIS — I119 Hypertensive heart disease without heart failure: Secondary | ICD-10-CM

## 2023-09-11 ENCOUNTER — Other Ambulatory Visit: Payer: Medicare Other

## 2023-09-11 DIAGNOSIS — M810 Age-related osteoporosis without current pathological fracture: Secondary | ICD-10-CM

## 2023-09-11 DIAGNOSIS — D509 Iron deficiency anemia, unspecified: Secondary | ICD-10-CM

## 2023-09-11 DIAGNOSIS — E785 Hyperlipidemia, unspecified: Secondary | ICD-10-CM

## 2023-09-11 DIAGNOSIS — I119 Hypertensive heart disease without heart failure: Secondary | ICD-10-CM

## 2023-09-12 LAB — COMPLETE METABOLIC PANEL WITH GFR
AG Ratio: 1.9 (calc) (ref 1.0–2.5)
ALT: 12 U/L (ref 6–29)
AST: 16 U/L (ref 10–35)
Albumin: 4 g/dL (ref 3.6–5.1)
Alkaline phosphatase (APISO): 51 U/L (ref 37–153)
BUN: 13 mg/dL (ref 7–25)
CO2: 29 mmol/L (ref 20–32)
Calcium: 10.1 mg/dL (ref 8.6–10.4)
Chloride: 108 mmol/L (ref 98–110)
Creat: 0.7 mg/dL (ref 0.60–0.95)
Globulin: 2.1 g/dL (ref 1.9–3.7)
Glucose, Bld: 87 mg/dL (ref 65–99)
Potassium: 4.3 mmol/L (ref 3.5–5.3)
Sodium: 141 mmol/L (ref 135–146)
Total Bilirubin: 0.6 mg/dL (ref 0.2–1.2)
Total Protein: 6.1 g/dL (ref 6.1–8.1)
eGFR: 85 mL/min/{1.73_m2} (ref >=60)

## 2023-09-12 LAB — CBC WITH DIFFERENTIAL/PLATELET
Absolute Lymphocytes: 1232 {cells}/uL (ref 850–3900)
Absolute Monocytes: 369 {cells}/uL (ref 200–950)
Basophils Absolute: 31 {cells}/uL (ref 0–200)
Basophils Relative: 0.6 %
Eosinophils Absolute: 99 {cells}/uL (ref 15–500)
Eosinophils Relative: 1.9 %
HCT: 37.7 % (ref 35.0–45.0)
Hemoglobin: 12.6 g/dL (ref 11.7–15.5)
MCH: 31.3 pg (ref 27.0–33.0)
MCHC: 33.4 g/dL (ref 32.0–36.0)
MCV: 93.8 fL (ref 80.0–100.0)
MPV: 9.3 fL (ref 7.5–12.5)
Monocytes Relative: 7.1 %
Neutro Abs: 3468 {cells}/uL (ref 1500–7800)
Neutrophils Relative %: 66.7 %
Platelets: 247 10*3/uL (ref 140–400)
RBC: 4.02 10*6/uL (ref 3.80–5.10)
RDW: 12.2 % (ref 11.0–15.0)
Total Lymphocyte: 23.7 %
WBC: 5.2 10*3/uL (ref 3.8–10.8)

## 2023-09-15 ENCOUNTER — Ambulatory Visit: Payer: Medicare Other | Admitting: Nurse Practitioner

## 2023-09-15 ENCOUNTER — Encounter: Payer: Self-pay | Admitting: Nurse Practitioner

## 2023-09-15 VITALS — BP 124/82 | HR 67 | Temp 96.9°F | Ht <= 58 in | Wt 154.8 lb

## 2023-09-15 DIAGNOSIS — K219 Gastro-esophageal reflux disease without esophagitis: Secondary | ICD-10-CM

## 2023-09-15 DIAGNOSIS — E785 Hyperlipidemia, unspecified: Secondary | ICD-10-CM | POA: Diagnosis not present

## 2023-09-15 DIAGNOSIS — M810 Age-related osteoporosis without current pathological fracture: Secondary | ICD-10-CM

## 2023-09-15 DIAGNOSIS — D509 Iron deficiency anemia, unspecified: Secondary | ICD-10-CM | POA: Diagnosis not present

## 2023-09-15 DIAGNOSIS — I119 Hypertensive heart disease without heart failure: Secondary | ICD-10-CM | POA: Diagnosis not present

## 2023-09-15 NOTE — Progress Notes (Signed)
 Careteam: Patient Care Team: Caro Harlene POUR, NP as PCP - General (Geriatric Medicine) Netta, Isadora KATHEE RIGGERS (Physician Assistant) Octavia Charleston, MD as Consulting Physician (Ophthalmology) Ethyl Lonni BRAVO, MD (Inactive) as Consulting Physician (Otolaryngology)  PLACE OF SERVICE:  [facility]  Advanced Directive information Does Patient Have a Medical Advance Directive?: Yes, Type of Advance Directive: Out of facility DNR (pink MOST or yellow form), Pre-existing out of facility DNR order (yellow form or pink MOST form): Yellow form placed in chart (order not valid for inpatient use);Pink MOST form placed in chart (order not valid for inpatient use), Does patient want to make changes to medical advance directive?: No - Patient declined  Allergies  Allergen Reactions   Shellfish Allergy Nausea And Vomiting    Chief Complaint  Patient presents with   Medical Management of Chronic Issues    6 month follow-up. Discuss need for covid boosters and AWV (pending for 09/19/23). Renew disability placard.      HPI: Patient is a 83 y.o. female for routine follow up.  Reports she is doing well. Denies pain.   Osteoporosis- started fosamax  this past year. No side effects  GERD- no symptoms at this time.   Iron def anemia- well controlled on iron twice weekly.   Has some constipation but able to manage   Blood pressure well controlled.   Continues zocor - attempts low cholesterol diet but eats what her husband eats  She does not do much exercise.    Review of Systems:  Review of Systems  Constitutional:  Negative for chills, fever and weight loss.  HENT:  Negative for tinnitus.   Respiratory:  Negative for cough, sputum production and shortness of breath.   Cardiovascular:  Negative for chest pain, palpitations and leg swelling.  Gastrointestinal:  Negative for abdominal pain, constipation, diarrhea and heartburn.  Genitourinary:  Negative for dysuria, frequency and  urgency.  Musculoskeletal:  Negative for back pain, falls, joint pain and myalgias.  Skin: Negative.   Neurological:  Negative for dizziness and headaches.  Psychiatric/Behavioral:  Negative for depression and memory loss. The patient does not have insomnia.     Past Medical History:  Diagnosis Date   Cancer of skin of scalp    2023   Disorder of bone and cartilage, unspecified    Disorders of bursae and tendons in shoulder region, unspecified    Encounter for long-term (current) use of other medications    Esophageal reflux    Hyperosmolality and/or hypernatremia    Osteoporosis, unspecified    Other and unspecified hyperlipidemia    Pain in joint, shoulder region    Pain in limb    Palpitations    Pure hypercholesterolemia    Reflux esophagitis    Tachycardia, unspecified    Unspecified essential hypertension    Unspecified urinary incontinence    Past Surgical History:  Procedure Laterality Date   APPENDECTOMY  09/12/1953   Bone Density  08/14/2012   CATARACT EXTRACTION Bilateral 09/12/2008   CHOLECYSTECTOMY  09/12/2002   COLONOSCOPY  03/31/2009   Diverticulosis, Dr.John Dyane    DIAGNOSTIC MAMMOGRAM     LAPAROSCOPY N/A 02/15/2018   Procedure: LAPAROSCOPY DIAGNOSTIC, LYSIS OF ADHESION;  Surgeon: Rubin Calamity, MD;  Location: [facility];  Service: General;  Laterality: N/A;   VARICOSE VEIN SURGERY Left    Summer 2022, Dr.Featherson ([facility])   Social History:   reports that she has never smoked. She has never used smokeless tobacco. She reports that she does not  drink alcohol and does not use drugs.  Family History  Problem Relation Age of Onset   Hypertension Mother    Diabetes Brother    Crohn's disease Neg Hx    Inflammatory bowel disease Neg Hx     Medications: Patient's Medications  New Prescriptions   No medications on file  Previous Medications   ACETAMINOPHEN  (TYLENOL ) 650 MG CR TABLET    Take 650 mg by mouth as needed.    ALENDRONATE  (FOSAMAX )  70 MG TABLET    TAKE 1 TABLET (70 MG TOTAL) BY MOUTH EVERY 7 DAYS WITH FULL GLASS WATER ON EMPTY STOMACH   ASPIRIN EC 81 MG TABLET    Take 81 mg by mouth daily.   ATENOLOL  (TENORMIN ) 25 MG TABLET    TAKE 1 TABLET BY MOUTH EVERYDAY AT BEDTIME   BIOTIN 5000 MCG CAPS    Take by mouth daily.   CALCIUM -VITAMIN D (OSCAL WITH D) 500-5 MG-MCG TABLET    Take 1 tablet by mouth 2 (two) times daily.   CHOLECALCIFEROL (VITAMIN D) 2000 UNITS TABLET    Take 2,000 Units by mouth daily.    FERROUS SULFATE (IRON) 325 (65 FE) MG TABS    Take 1 tablet by mouth as directed. On Monday's and Friday's   PANTOPRAZOLE  (PROTONIX ) 40 MG TABLET    TAKE 1 TABLET BY MOUTH EVERY DAY   SIMVASTATIN  (ZOCOR ) 40 MG TABLET    TAKE 1 TABLET BY MOUTH DAILY AT 6:00PM  Modified Medications   No medications on file  Discontinued Medications   No medications on file    Physical Exam:  Vitals:   09/14/23 1654  BP: 124/82  Pulse: 67  Temp: (!) 96.9 F (36.1 C)  TempSrc: Temporal  SpO2: 99%  Weight: 154 lb 12.8 oz (70.2 kg)  Height: 4' 10 (1.473 m)   Body mass index is 32.35 kg/m. Wt Readings from Last 3 Encounters:  09/14/23 154 lb 12.8 oz (70.2 kg)  03/13/23 155 lb (70.3 kg)  01/13/23 157 lb 3.2 oz (71.3 kg)    Physical Exam Constitutional:      General: She is not in acute distress.    Appearance: She is well-developed. She is not diaphoretic.  HENT:     Head: Normocephalic and atraumatic.     Mouth/Throat:     Pharynx: No oropharyngeal exudate.  Eyes:     Conjunctiva/sclera: Conjunctivae normal.     Pupils: Pupils are equal, round, and reactive to light.  Cardiovascular:     Rate and Rhythm: Normal rate and regular rhythm.     Heart sounds: Normal heart sounds.  Pulmonary:     Effort: Pulmonary effort is normal.     Breath sounds: Normal breath sounds.  Abdominal:     General: Bowel sounds are normal.     Palpations: Abdomen is soft.  Musculoskeletal:     Cervical back: Normal range of motion and neck  supple.     Right lower leg: No edema.     Left lower leg: No edema.  Skin:    General: Skin is warm and dry.  Neurological:     Mental Status: She is alert.  Psychiatric:        Mood and Affect: Mood normal.    Labs reviewed: Basic Metabolic Panel: Recent Labs    03/08/23 0802 09/11/23 0802  NA 140 141  K 4.4 4.3  CL 105 108  CO2 28 29  GLUCOSE 83 87  BUN 17 13  CREATININE  0.72 0.70  CALCIUM  10.6* 10.1   Liver Function Tests: Recent Labs    03/08/23 0802 09/11/23 0802  AST 15 16  ALT 11 12  BILITOT 0.6 0.6  PROT 6.3 6.1   No results for input(s): LIPASE, AMYLASE in the last 8760 hours. No results for input(s): AMMONIA in the last 8760 hours. CBC: Recent Labs    03/08/23 0802 09/11/23 0802  WBC 5.5 5.2  NEUTROABS 3,548 3,468  HGB 12.1 12.6  HCT 36.1 37.7  MCV 92.1 93.8  PLT 271 247   Lipid Panel: Recent Labs    03/08/23 0802  CHOL 179  HDL 61  LDLCALC 94  TRIG 143  CHOLHDL 2.9   TSH: No results for input(s): TSH in the last 8760 hours. A1C: No results found for: HGBA1C   Assessment/Plan 1. Iron deficiency anemia, unspecified iron deficiency anemia type (Primary) Hgb normal on iron twice weekly supplement  2. Benign hypertensive heart disease without heart failure -Blood pressure well controlled, goal bp <140/90 Continue current medications and dietary modifications follow metabolic panel - COMPLETE METABOLIC PANEL WITH GFR; Future - CBC with Differential/Platelet; Future  3. Gastroesophageal reflux disease without esophagitis Stable on protonix  daily  4. Hyperlipidemia with target LDL less than 130 -stable on zocor  with dietary modifications - Lipid panel; Future - COMPLETE METABOLIC PANEL WITH GFR; Future  5. Age-related osteoporosis without current pathological fracture Continues on fosamax  with weight bearing exercises and vit d supplement    Return in about 6 months (around 03/14/2024) for routine follow up, labs  prior .  Charlies Rayburn K. Caro BODILY [facility] [phone]

## 2023-09-19 ENCOUNTER — Ambulatory Visit: Payer: Medicare Other | Admitting: Nurse Practitioner

## 2023-09-22 ENCOUNTER — Ambulatory Visit: Payer: Medicare Other | Admitting: Nurse Practitioner

## 2023-09-22 VITALS — BP 110/80 | HR 63 | Temp 96.2°F | Ht 59.0 in | Wt 155.0 lb

## 2023-09-22 DIAGNOSIS — Z0000 Encounter for general adult medical examination without abnormal findings: Secondary | ICD-10-CM | POA: Diagnosis not present

## 2023-09-22 NOTE — Patient Instructions (Signed)
  Brooke Cox , Thank you for taking time to come for your Medicare Wellness Visit. I appreciate your ongoing commitment to your health goals. Please review the following plan we discussed and let me know if I can assist you in the future.   To get COVID booster at local pharmacy    This is a list of the screening recommended for you and due dates:  Health Maintenance  Topic Date Due   COVID-19 Vaccine (5 - 2024-25 season) 05/14/2023   Mammogram  06/12/2024   Medicare Annual Wellness Visit  09/21/2024   DTaP/Tdap/Td vaccine (3 - Td or Tdap) 11/05/2032   Pneumonia Vaccine  Completed   Flu Shot  Completed   DEXA scan (bone density measurement)  Completed   Zoster (Shingles) Vaccine  Completed   HPV Vaccine  Aged Out

## 2023-09-22 NOTE — Progress Notes (Signed)
 Subjective:   Brooke Cox is a 83 y.o. female who presents for Medicare Annual (Subsequent) preventive examination.  Visit Complete: in person at [facility]   Cardiac Risk Factors include: advanced age (>30men, >39 women);dyslipidemia;obesity (BMI >30kg/m2)     Objective:    Today's Vitals   09/22/23 0901  BP: 110/80  Pulse: 63  Temp: (!) 96.2 F (35.7 C)  TempSrc: Temporal  SpO2: 98%  Weight: 155 lb (70.3 kg)  Height: 4' 11 (1.499 m)   Body mass index is 31.31 kg/m.     09/21/2023    4:43 PM 09/15/2023    8:22 AM 09/14/2023    4:54 PM 03/13/2023    8:15 AM 03/10/2023    4:57 PM 02/01/2023    5:27 PM 09/09/2022    7:51 AM  Advanced Directives  Does Patient Have a Medical Advance Directive? Yes Yes Yes Yes Yes No Yes  Type of Advance Directive Out of facility DNR (pink MOST or yellow form) Out of facility DNR (pink MOST or yellow form) Out of facility DNR (pink MOST or yellow form) Out of facility DNR (pink MOST or yellow form) Out of facility DNR (pink MOST or yellow form)  Out of facility DNR (pink MOST or yellow form)  Does patient want to make changes to medical advance directive? No - Patient declined No - Patient declined No - Patient declined No - Patient declined No - Patient declined  No - Patient declined  Would patient like information on creating a medical advance directive?      No - Patient declined   Pre-existing out of facility DNR order (yellow form or pink MOST form) Yellow form placed in chart (order not valid for inpatient use);Pink MOST form placed in chart (order not valid for inpatient use) Yellow form placed in chart (order not valid for inpatient use);Pink MOST form placed in chart (order not valid for inpatient use) Yellow form placed in chart (order not valid for inpatient use);Pink MOST form placed in chart (order not valid for inpatient use) Pink MOST form placed in chart (order not valid for inpatient use);Yellow form placed in chart (order not valid for  inpatient use) Pink MOST form placed in chart (order not valid for inpatient use);Yellow form placed in chart (order not valid for inpatient use)  Yellow form placed in chart (order not valid for inpatient use);Pink MOST form placed in chart (order not valid for inpatient use)    Current Medications (verified) Outpatient Encounter Medications as of 09/22/2023  Medication Sig   acetaminophen  (TYLENOL ) 650 MG CR tablet Take 650 mg by mouth as needed.    alendronate  (FOSAMAX ) 70 MG tablet TAKE 1 TABLET (70 MG TOTAL) BY MOUTH EVERY 7 DAYS WITH FULL GLASS WATER ON EMPTY STOMACH   aspirin EC 81 MG tablet Take 81 mg by mouth daily.   atenolol  (TENORMIN ) 25 MG tablet TAKE 1 TABLET BY MOUTH EVERYDAY AT BEDTIME   calcium -vitamin D (OSCAL WITH D) 500-5 MG-MCG tablet Take 1 tablet by mouth 2 (two) times daily.   Cholecalciferol (VITAMIN D) 2000 UNITS tablet Take 2,000 Units by mouth daily.    Ferrous Sulfate (IRON) 325 (65 Fe) MG TABS Take 1 tablet by mouth as directed. On Monday's and Friday's   pantoprazole  (PROTONIX ) 40 MG tablet TAKE 1 TABLET BY MOUTH EVERY DAY   simvastatin  (ZOCOR ) 40 MG tablet TAKE 1 TABLET BY MOUTH DAILY AT 6:00PM   Biotin 5000 MCG CAPS Take by mouth daily. (Patient not  taking: Reported on 09/22/2023)   No facility-administered encounter medications on file as of 09/22/2023.    Allergies (verified) Shellfish allergy   History: Past Medical History:  Diagnosis Date   Cancer of skin of scalp    2023   Disorder of bone and cartilage, unspecified    Disorders of bursae and tendons in shoulder region, unspecified    Encounter for long-term (current) use of other medications    Esophageal reflux    Hyperosmolality and/or hypernatremia    Osteoporosis, unspecified    Other and unspecified hyperlipidemia    Pain in joint, shoulder region    Pain in limb    Palpitations    Pure hypercholesterolemia    Reflux esophagitis    Tachycardia, unspecified    Unspecified essential  hypertension    Unspecified urinary incontinence    Past Surgical History:  Procedure Laterality Date   APPENDECTOMY  09/12/1953   Bone Density  08/14/2012   CATARACT EXTRACTION Bilateral 09/12/2008   CHOLECYSTECTOMY  09/12/2002   COLONOSCOPY  03/31/2009   Diverticulosis, Dr.John Dyane    DIAGNOSTIC MAMMOGRAM     LAPAROSCOPY N/A 02/15/2018   Procedure: LAPAROSCOPY DIAGNOSTIC, LYSIS OF ADHESION;  Surgeon: Rubin Calamity, MD;  Location: [facility];  Service: General;  Laterality: N/A;   VARICOSE VEIN SURGERY Left    Summer 2022, Dr.Featherson ([facility])   Family History  Problem Relation Age of Onset   Hypertension Mother    Diabetes Brother    Crohn's disease Neg Hx    Inflammatory bowel disease Neg Hx    Social History   Socioeconomic History   Marital status: Married    Spouse name: Not on file   Number of children: Not on file   Years of education: Not on file   Highest education level: Not on file  Occupational History   Occupation: retired  Tobacco Use   Smoking status: Never   Smokeless tobacco: Never  Vaping Use   Vaping status: Never Used  Substance and Sexual Activity   Alcohol use: No   Drug use: No   Sexual activity: Not Currently  Other Topics Concern   Not on file  Social History Narrative   Not on file   Social Drivers of Health   Financial Resource Strain: Low Risk  (08/07/2017)   Overall Financial Resource Strain (CARDIA)    Difficulty of Paying Living Expenses: Not hard at all  Food Insecurity: No Food Insecurity (08/07/2017)   Hunger Vital Sign    Worried About Running Out of Food in the Last Year: Never true    Ran Out of Food in the Last Year: Never true  Transportation Needs: No Transportation Needs (08/07/2017)   PRAPARE - Administrator, Civil Service (Medical): No    Lack of Transportation (Non-Medical): No  Physical Activity: Insufficiently Active (08/20/2018)   Exercise Vital Sign    Days of Exercise per Week: 3 days     Minutes of Exercise per Session: 40 min  Stress: No Stress Concern Present (08/07/2017)   [employer] of Occupational Health - Occupational Stress Questionnaire    Feeling of Stress : Only a little  Social Connections: Socially Integrated (08/07/2017)   Social Connection and Isolation Panel [NHANES]    Frequency of Communication with Friends and Family: More than three times a week    Frequency of Social Gatherings with Friends and Family: More than three times a week    Attends Religious Services: 1 to 4 times  per year    Active Member of Clubs or Organizations: Yes    Attends Banker Meetings: 1 to 4 times per year    Marital Status: Married    Tobacco Counseling Counseling given: Not Answered   Clinical Intake:  Pre-visit preparation completed: Yes  Pain : No/denies pain     BMI - recorded: 31.31 Diabetes: No  How often do you need to have someone help you when you read instructions, pamphlets, or other written materials from your doctor or pharmacy?: 1 - Never         Activities of Daily Living    09/22/2023    9:20 AM  In your present state of health, do you have any difficulty performing the following activities:  Hearing? 0  Vision? 0  Difficulty concentrating or making decisions? 0  Walking or climbing stairs? 0  Comment a lttle trouble going up stair  Dressing or bathing? 0  Doing errands, shopping? 0  Preparing Food and eating ? N  Using the Toilet? N  In the past six months, have you accidently leaked urine? Y  Do you have problems with loss of bowel control? N  Managing your Medications? N  Managing your Finances? N  Housekeeping or managing your Housekeeping? N    Patient Care Team: Caro Harlene POUR, NP as PCP - General (Geriatric Medicine) Netta Isadora KATHEE RIGGERS (Physician Assistant) Octavia Charleston, MD as Consulting Physician (Ophthalmology) Ethyl Lonni BRAVO, MD (Inactive) as Consulting Physician  (Otolaryngology)  Indicate any recent Medical Services you may have received from other than [facility] providers in the past year (date may be approximate).     Assessment:   This is a routine wellness examination for Brooke Cox.  Hearing/Vision screen Hearing Screening - Comments:: No hearing issues  Vision Screening - Comments:: Last eye exam less than 12 months ago, Dr.[facility]    Goals Addressed   None    Depression Screen    09/22/2023    9:04 AM 09/15/2023    8:21 AM 08/31/2022    2:20 PM 08/31/2021    2:35 PM 08/27/2021    8:37 AM 08/24/2020   10:51 AM 08/23/2019   10:11 AM  PHQ 2/9 Scores  PHQ - 2 Score 0 0 0 0 0 0 0    Fall Risk    09/22/2023    9:03 AM 09/15/2023    8:21 AM 01/13/2023    8:31 AM 09/09/2022    8:07 AM 08/31/2022    2:20 PM  Fall Risk   Falls in the past year? 0 0 0 0 0  Number falls in past yr: 0 0 0 0 0  Injury with Fall? 0 0 0 0 0  Risk for fall due to : No Fall Risks No Fall Risks No Fall Risks No Fall Risks No Fall Risks  Follow up Falls evaluation completed Falls evaluation completed Falls evaluation completed Falls evaluation completed Falls evaluation completed    MEDICARE RISK AT HOME: Medicare Risk at Home Any stairs in or around the home?: No Home free of loose throw rugs in walkways, pet beds, electrical cords, etc?: Yes Adequate lighting in your home to reduce risk of falls?: Yes Life alert?: No Use of a cane, walker or w/c?: No Grab bars in the bathroom?: Yes Shower chair or bench in shower?: No Elevated toilet seat or a handicapped toilet?: No  TIMED UP AND GO:  Was the test performed?  No    Cognitive Function:  09/22/2023    9:06 AM 08/20/2018    8:58 AM 08/07/2017   10:14 AM 08/01/2016   10:21 AM 07/30/2015    9:03 AM  MMSE - Mini Mental State Exam  Orientation to time 5 5 5 5 5   Orientation to Place 5 5 5 5 5   Registration 3 3 3 3 3   Attention/ Calculation 5 5 5 5 5   Recall 3 3 2 3 3   Language- name 2 objects 2 2 2 2 2    Language- repeat 1 1 1 1 1   Language- follow 3 step command 3 3 3 3 3   Language- read & follow direction 1 1 1 1 1   Write a sentence 1 1 1 1 1   Copy design 1 1 1 1 1   Total score 30 30 29 30 30         08/31/2022    2:22 PM 08/31/2021    2:38 PM 08/24/2020   10:53 AM 08/23/2019   10:11 AM  6CIT Screen  What Year? 0 points 0 points 0 points 0 points  What month? 0 points 0 points 0 points 0 points  What time? 0 points 0 points 0 points 0 points  Count back from 20 0 points 0 points 0 points 0 points  Months in reverse 0 points 0 points 0 points 0 points  Repeat phrase 0 points 0 points 0 points 2 points  Total Score 0 points 0 points 0 points 2 points    Immunizations Immunization History  Administered Date(s) Administered   Fluad Quad(high Dose 65+) 07/09/2019, 06/10/2020, 07/01/2021, 07/06/2022   Fluad Trivalent(High Dose 65+) 07/05/2023   Influenza, High Dose Seasonal PF 07/12/2017, 06/22/2018   Influenza,inj,Quad PF,6+ Mos 07/03/2013, 07/30/2015, 08/01/2016   Influenza-Unspecified 07/03/2012, 06/12/2014   PFIZER Comirnaty(Gray Top)Covid-19 Tri-Sucrose Vaccine 12/21/2020   PFIZER(Purple Top)SARS-COV-2 Vaccination 10/02/2019, 10/20/2019, 08/12/2020   Pneumococcal Conjugate-13 07/23/2014   Pneumococcal Polysaccharide-23 02/20/2008   Tdap 07/05/2011, 11/05/2022   Zoster Recombinant(Shingrix) 09/06/2018, 03/27/2019   Zoster, Live 07/28/2011    TDAP status: Up to date  Flu Vaccine status: Up to date  Pneumococcal vaccine status: Up to date  Covid-19 vaccine status: Information provided on how to obtain vaccines.   Qualifies for Shingles Vaccine? Yes   Zostavax completed No   Shingrix Completed?: Yes  Screening Tests Health Maintenance  Topic Date Due   COVID-19 Vaccine (5 - 2024-25 season) 05/14/2023   MAMMOGRAM  06/12/2024   Medicare Annual Wellness (AWV)  09/21/2024   DTaP/Tdap/Td (3 - Td or Tdap) 11/05/2032   Pneumonia Vaccine 36+ Years old  Completed    INFLUENZA VACCINE  Completed   DEXA SCAN  Completed   Zoster Vaccines- Shingrix  Completed   HPV VACCINES  Aged Out    Health Maintenance  Health Maintenance Due  Topic Date Due   COVID-19 Vaccine (5 - 2024-25 season) 05/14/2023    Colorectal cancer screening: No longer required.   Mammogram status: No longer required due to aged out.  Bone Density status: Completed 06/07/2022. Results reflect: Bone density results: OSTEOPOROSIS. Repeat every 2 years.  Lung Cancer Screening: (Low Dose CT Chest recommended if Age 36-80 years, 20 pack-year currently smoking OR have quit w/in 15years.) does not qualify.   Lung Cancer Screening Referral: na  Additional Screening:  Hepatitis C Screening: does not qualify Vision Screening: Recommended annual ophthalmology exams for early detection of glaucoma and other disorders of the eye. Is the patient up to date with their annual eye exam?  Yes  Who is the provider or what is the name of the office in which the patient attends annual eye exams? [facility] If pt is not established with a provider, would they like to be referred to a provider to establish care? No .   Dental Screening: Recommended annual dental exams for proper oral hygiene   Community Resource Referral / Chronic Care Management: CRR required this visit?  No   CCM required this visit?  No     Plan:     I have personally reviewed and noted the following in the patient's chart:   Medical and social history Use of alcohol, tobacco or illicit drugs  Current medications and supplements including opioid prescriptions. Patient is not currently taking opioid prescriptions. Functional ability and status Nutritional status Physical activity Advanced directives List of other physicians Hospitalizations, surgeries, and ER visits in previous 12 months Vitals Screenings to include cognitive, depression, and falls Referrals and appointments  In addition, I have reviewed and  discussed with patient certain preventive protocols, quality metrics, and best practice recommendations. A written personalized care plan for preventive services as well as general preventive health recommendations were provided to patient.     Harlene MARLA An, NP   09/22/2023

## 2023-10-15 ENCOUNTER — Other Ambulatory Visit: Payer: Self-pay | Admitting: Nurse Practitioner

## 2023-10-15 DIAGNOSIS — K219 Gastro-esophageal reflux disease without esophagitis: Secondary | ICD-10-CM

## 2024-01-24 ENCOUNTER — Ambulatory Visit: Admitting: Sports Medicine

## 2024-01-24 ENCOUNTER — Encounter: Payer: Self-pay | Admitting: Sports Medicine

## 2024-01-24 VITALS — BP 134/80 | HR 70 | Temp 98.1°F | Resp 20 | Ht 59.0 in | Wt 154.4 lb

## 2024-01-24 DIAGNOSIS — H6123 Impacted cerumen, bilateral: Secondary | ICD-10-CM | POA: Diagnosis not present

## 2024-01-24 DIAGNOSIS — R051 Acute cough: Secondary | ICD-10-CM | POA: Diagnosis not present

## 2024-01-24 MED ORDER — GUAIFENESIN-DM 100-10 MG/5ML PO SYRP
5.0000 mL | ORAL_SOLUTION | ORAL | 0 refills | Status: DC | PRN
Start: 2024-01-24 — End: 2024-03-18

## 2024-01-24 MED ORDER — CARBAMIDE PEROXIDE 6.5 % OT SOLN
5.0000 [drp] | Freq: Two times a day (BID) | OTIC | 0 refills | Status: DC
Start: 2024-01-24 — End: 2024-03-18

## 2024-01-24 NOTE — Patient Instructions (Signed)
 Use debrox ear drops twice daily  Use robitussin cough syrup for cough

## 2024-01-24 NOTE — Progress Notes (Signed)
 Careteam: Patient Care Team: Verma Gobble, NP as PCP - General (Geriatric Medicine) Garen Juneau Marven Slimmer (Physician Assistant) Maris Sickle, MD as Consulting Physician (Ophthalmology) Prescott Brodie, MD (Inactive) as Consulting Physician (Otolaryngology)  PLACE OF SERVICE:  [facility]  Advanced Directive information    Allergies  Allergen Reactions   Shellfish Allergy Nausea And Vomiting    Chief Complaint  Patient presents with   Cerumen Impaction        History of Present Illness 83 yr old F with h/o HTN, GERD, HLD presented to clinic for ear irrigation.   Pt presented to clinic for ear irrigation  C/o her ears are stuffed up  Denies ear pain, discharge, tinnitus.  Cough  Since 2 days  Dry cough Denies fevers, chills, sob, myalgias, abdominal pain, nausea, vomiting, diarrhea. No sick contacts Denies sore throat    Review of Systems:  Review of Systems  Constitutional:  Negative for chills and fever.  HENT:  Negative for congestion, ear pain, hearing loss, sore throat and tinnitus.   Eyes:  Negative for double vision.  Respiratory:  Positive for cough. Negative for sputum production and shortness of breath.   Cardiovascular:  Negative for chest pain, palpitations and leg swelling.  Gastrointestinal:  Negative for abdominal pain, heartburn and nausea.  Genitourinary:  Negative for dysuria, frequency and hematuria.  Musculoskeletal:  Negative for falls and myalgias.  Neurological:  Negative for dizziness, sensory change and focal weakness.   Negative unless indicated in HPI.   Past Medical History:  Diagnosis Date   Cancer of skin of scalp    2023   Disorder of bone and cartilage, unspecified    Disorders of bursae and tendons in shoulder region, unspecified    Encounter for long-term (current) use of other medications    Esophageal reflux    Hyperosmolality and/or hypernatremia    Osteoporosis, unspecified    Other and unspecified  hyperlipidemia    Pain in joint, shoulder region    Pain in limb    Palpitations    Pure hypercholesterolemia    Reflux esophagitis    Tachycardia, unspecified    Unspecified essential hypertension    Unspecified urinary incontinence    Past Surgical History:  Procedure Laterality Date   APPENDECTOMY  09/12/1953   Bone Density  08/14/2012   CATARACT EXTRACTION Bilateral 09/12/2008   CHOLECYSTECTOMY  09/12/2002   COLONOSCOPY  03/31/2009   Diverticulosis, Dr.John Sabra Cramp    DIAGNOSTIC MAMMOGRAM     LAPAROSCOPY N/A 02/15/2018   Procedure: LAPAROSCOPY DIAGNOSTIC, LYSIS OF ADHESION;  Surgeon: Shela Derby, MD;  Location: [facility];  Service: General;  Laterality: N/A;   VARICOSE VEIN SURGERY Left    Summer 2022, Dr.Featherson ([facility])   Social History:   reports that she has never smoked. She has never used smokeless tobacco. She reports that she does not drink alcohol and does not use drugs.  Family History  Problem Relation Age of Onset   Hypertension Mother    Diabetes Brother    Crohn's disease Neg Hx    Inflammatory bowel disease Neg Hx     Medications: Patient's Medications  New Prescriptions   No medications on file  Previous Medications   ACETAMINOPHEN  (TYLENOL ) 650 MG CR TABLET    Take 650 mg by mouth as needed.    ALENDRONATE  (FOSAMAX ) 70 MG TABLET    TAKE 1 TABLET (70 MG TOTAL) BY MOUTH EVERY 7 DAYS WITH FULL GLASS WATER ON EMPTY STOMACH  ASPIRIN EC 81 MG TABLET    Take 81 mg by mouth daily.   ATENOLOL  (TENORMIN ) 25 MG TABLET    TAKE 1 TABLET BY MOUTH EVERYDAY AT BEDTIME   BIOTIN 5000 MCG CAPS    Take by mouth daily.   CALCIUM -VITAMIN D (OSCAL WITH D) 500-5 MG-MCG TABLET    Take 1 tablet by mouth 2 (two) times daily.   CHOLECALCIFEROL (VITAMIN D) 2000 UNITS TABLET    Take 2,000 Units by mouth daily.    FERROUS SULFATE (IRON) 325 (65 FE) MG TABS    Take 1 tablet by mouth as directed. On Monday's and Friday's   PANTOPRAZOLE  (PROTONIX ) 40 MG TABLET    TAKE 1  TABLET BY MOUTH EVERY DAY   SIMVASTATIN  (ZOCOR ) 40 MG TABLET    TAKE 1 TABLET BY MOUTH DAILY AT 6:00PM  Modified Medications   No medications on file  Discontinued Medications   No medications on file    Physical Exam: Vitals:   01/24/24 0819  BP: 134/80  Pulse: 70  Resp: 20  Temp: 98.1 F (36.7 C)  SpO2: 98%  Weight: 154 lb 6.4 oz (70 kg)  Height: 4\' 11"  (1.499 m)   Body mass index is 31.19 kg/m. BP Readings from Last 3 Encounters:  01/24/24 134/80  09/22/23 110/80  09/14/23 124/82   Wt Readings from Last 3 Encounters:  01/24/24 154 lb 6.4 oz (70 kg)  09/22/23 155 lb (70.3 kg)  09/14/23 154 lb 12.8 oz (70.2 kg)    Physical Exam Constitutional:      Appearance: Normal appearance.  HENT:     Head: Normocephalic and atraumatic.     Left Ear: There is impacted cerumen.  Cardiovascular:     Rate and Rhythm: Normal rate and regular rhythm.  Pulmonary:     Effort: Pulmonary effort is normal. No respiratory distress.     Breath sounds: Normal breath sounds. No wheezing.  Abdominal:     General: Bowel sounds are normal. There is no distension.     Tenderness: There is no abdominal tenderness. There is no guarding or rebound.     Comments:    Musculoskeletal:        General: No swelling or tenderness.  Neurological:     Mental Status: She is alert. Mental status is at baseline.     Motor: No weakness.     Labs reviewed: Basic Metabolic Panel: Recent Labs    03/08/23 0802 09/11/23 0802  NA 140 141  K 4.4 4.3  CL 105 108  CO2 28 29  GLUCOSE 83 87  BUN 17 13  CREATININE 0.72 0.70  CALCIUM  10.6* 10.1   Liver Function Tests: Recent Labs    03/08/23 0802 09/11/23 0802  AST 15 16  ALT 11 12  BILITOT 0.6 0.6  PROT 6.3 6.1   No results for input(s): "LIPASE", "AMYLASE" in the last 8760 hours. No results for input(s): "AMMONIA" in the last 8760 hours. CBC: Recent Labs    03/08/23 0802 09/11/23 0802  WBC 5.5 5.2  NEUTROABS 3,548 3,468  HGB 12.1  12.6  HCT 36.1 37.7  MCV 92.1 93.8  PLT 271 247   Lipid Panel: Recent Labs    03/08/23 0802  CHOL 179  HDL 61  LDLCALC 94  TRIG 143  CHOLHDL 2.9   TSH: No results for input(s): "TSH" in the last 8760 hours. A1C: No results found for: "HGBA1C"  Assessment and Plan Assessment & Plan   1. Bilateral  impacted cerumen [H61.23] (Primary)  - carbamide peroxide (DEBROX) 6.5 % OTIC solution; Place 5 drops into the left ear 2 (two) times daily.  Dispense: 15 mL; Refill: 0  2. Acute cough  - guaiFENesin -dextromethorphan (ROBITUSSIN DM) 100-10 MG/5ML syrup; Take 5 mLs by mouth every 4 (four) hours as needed for cough.  Dispense: 118 mL; Refill: 0     No follow-ups on file.:   Seanpaul Preece

## 2024-01-31 ENCOUNTER — Encounter: Admitting: Sports Medicine

## 2024-02-01 NOTE — Progress Notes (Signed)
 This encounter was created in error - please disregard.

## 2024-02-07 ENCOUNTER — Other Ambulatory Visit: Payer: Self-pay | Admitting: Nurse Practitioner

## 2024-02-07 DIAGNOSIS — E785 Hyperlipidemia, unspecified: Secondary | ICD-10-CM

## 2024-02-07 DIAGNOSIS — I119 Hypertensive heart disease without heart failure: Secondary | ICD-10-CM

## 2024-03-13 ENCOUNTER — Other Ambulatory Visit: Payer: Medicare Other

## 2024-03-13 DIAGNOSIS — E785 Hyperlipidemia, unspecified: Secondary | ICD-10-CM

## 2024-03-13 DIAGNOSIS — I119 Hypertensive heart disease without heart failure: Secondary | ICD-10-CM

## 2024-03-13 LAB — CBC WITH DIFFERENTIAL/PLATELET
Absolute Lymphocytes: 1381 {cells}/uL (ref 850–3900)
Absolute Monocytes: 395 {cells}/uL (ref 200–950)
Basophils Absolute: 18 {cells}/uL (ref 0–200)
Basophils Relative: 0.3 %
Eosinophils Absolute: 159 {cells}/uL (ref 15–500)
Eosinophils Relative: 2.7 %
HCT: 40.7 % (ref 35.0–45.0)
Hemoglobin: 13.1 g/dL (ref 11.7–15.5)
MCH: 30.8 pg (ref 27.0–33.0)
MCHC: 32.2 g/dL (ref 32.0–36.0)
MCV: 95.5 fL (ref 80.0–100.0)
MPV: 9.2 fL (ref 7.5–12.5)
Monocytes Relative: 6.7 %
Neutro Abs: 3947 {cells}/uL (ref 1500–7800)
Neutrophils Relative %: 66.9 %
Platelets: 268 10*3/uL (ref 140–400)
RBC: 4.26 10*6/uL (ref 3.80–5.10)
RDW: 12.2 % (ref 11.0–15.0)
Total Lymphocyte: 23.4 %
WBC: 5.9 10*3/uL (ref 3.8–10.8)

## 2024-03-13 LAB — LIPID PANEL
Cholesterol: 195 mg/dL (ref <200)
HDL: 62 mg/dL (ref >=50)
LDL Cholesterol (Calc): 108 mg/dL — ABNORMAL HIGH
Non-HDL Cholesterol (Calc): 133 mg/dL — ABNORMAL HIGH (ref <130)
Total CHOL/HDL Ratio: 3.1 (calc) (ref <5.0)
Triglycerides: 136 mg/dL (ref <150)

## 2024-03-13 LAB — COMPREHENSIVE METABOLIC PANEL WITH GFR
AG Ratio: 1.8 (calc) (ref 1.0–2.5)
ALT: 9 U/L (ref 6–29)
AST: 13 U/L (ref 10–35)
Albumin: 4.2 g/dL (ref 3.6–5.1)
Alkaline phosphatase (APISO): 53 U/L (ref 37–153)
BUN: 14 mg/dL (ref 7–25)
CO2: 31 mmol/L (ref 20–32)
Calcium: 10.2 mg/dL (ref 8.6–10.4)
Chloride: 106 mmol/L (ref 98–110)
Creat: 0.65 mg/dL (ref 0.60–0.95)
Globulin: 2.3 g/dL (ref 1.9–3.7)
Glucose, Bld: 84 mg/dL (ref 65–99)
Potassium: 4.4 mmol/L (ref 3.5–5.3)
Sodium: 141 mmol/L (ref 135–146)
Total Bilirubin: 0.5 mg/dL (ref 0.2–1.2)
Total Protein: 6.5 g/dL (ref 6.1–8.1)
eGFR: 86 mL/min/{1.73_m2} (ref >=60)

## 2024-03-18 ENCOUNTER — Ambulatory Visit: Payer: Medicare Other | Admitting: Nurse Practitioner

## 2024-03-18 ENCOUNTER — Encounter: Payer: Self-pay | Admitting: Nurse Practitioner

## 2024-03-18 VITALS — BP 111/75 | HR 66 | Temp 97.2°F | Resp 18 | Ht 59.0 in | Wt 153.6 lb

## 2024-03-18 DIAGNOSIS — K219 Gastro-esophageal reflux disease without esophagitis: Secondary | ICD-10-CM | POA: Diagnosis not present

## 2024-03-18 DIAGNOSIS — M810 Age-related osteoporosis without current pathological fracture: Secondary | ICD-10-CM

## 2024-03-18 DIAGNOSIS — E785 Hyperlipidemia, unspecified: Secondary | ICD-10-CM

## 2024-03-18 DIAGNOSIS — D509 Iron deficiency anemia, unspecified: Secondary | ICD-10-CM | POA: Diagnosis not present

## 2024-03-18 DIAGNOSIS — I119 Hypertensive heart disease without heart failure: Secondary | ICD-10-CM | POA: Diagnosis not present

## 2024-03-18 DIAGNOSIS — M1712 Unilateral primary osteoarthritis, left knee: Secondary | ICD-10-CM | POA: Insufficient documentation

## 2024-03-18 MED ORDER — DICLOFENAC SODIUM 1 % EX GEL
4.0000 g | Freq: Four times a day (QID) | CUTANEOUS | 2 refills | Status: AC
Start: 2024-03-18 — End: ?

## 2024-03-18 NOTE — Assessment & Plan Note (Signed)
 Has been on fosamax  since 2023- continue for 5 years continue to take calcium  600 mg twice daily with Vitamin D 2000 units daily and weight bearing activity 30 mins/5 days a week

## 2024-03-18 NOTE — Assessment & Plan Note (Addendum)
 Stop iron as hgb as been stable.  Will monitor.

## 2024-03-18 NOTE — Patient Instructions (Signed)
 Okay to use tylenol  1000 mg by mouth every 8 hours for joint pain Can you Voltaren  gel up to 4 times daily to knee  Stop iron supplement

## 2024-03-18 NOTE — Progress Notes (Signed)
 Careteam: Patient Care Team: Caro Harlene POUR, NP as PCP - General (Geriatric Medicine) Netta Isadora KATHEE RIGGERS (Physician Assistant) Octavia Charleston, MD as Consulting Physician (Ophthalmology) Ethyl Lonni BRAVO, MD (Inactive) as Consulting Physician (Otolaryngology)  PLACE OF SERVICE:  [facility]  Advanced Directive information    Allergies  Allergen Reactions   Shellfish Allergy Nausea And Vomiting    Chief Complaint  Patient presents with   Medical Management of Chronic Issues     6 months follow-up    HPI:  Discussed the use of AI scribe software for clinical note transcription with the patient, who gave verbal consent to proceed.  History of Present Illness Brooke Cox is an 83 year old female who presents for a six-month follow-up visit.  Her recent blood work showed an increase in LDL cholesterol to 108 mg/dL from 94 mg/dL a year ago. She is on simvastatin  for cholesterol management, and her total cholesterol remains under 200 mg/dL with HDL cholesterol at goal.    Blood counts, electrolytes, kidney, and liver function tests were normal. She takes an iron supplement twice a week without side effects.  She has been on Fosamax  for osteoporosis since 2023 and takes calcium  and vitamin D supplements.   Her current medication regimen includes Protonix  for acid reflux, which effectively manages her symptoms.  She experiences pain in her left knee, from the knee down, rated as 6 out of 10. The pain has progressively worsened over time without any known injury and is not relieved by Tylenol . It sometimes affects her walking.  No chest pain, palpitations, shortness of breath, cough, congestion, changes in bowel movements, vaginal bleeding, or blood in the urine.  Reports she eats 3 meals a day, sleep well other than having to get up to urinate.      Review of Systems:  Review of Systems  Constitutional:  Negative for chills, fever and weight loss.  HENT:   Negative for tinnitus.   Respiratory:  Negative for cough, sputum production and shortness of breath.   Cardiovascular:  Negative for chest pain, palpitations and leg swelling.  Gastrointestinal:  Negative for abdominal pain, constipation, diarrhea and heartburn.  Genitourinary:  Negative for dysuria, frequency and urgency.  Musculoskeletal:  Positive for joint pain. Negative for back pain, falls and myalgias.  Skin: Negative.   Neurological:  Negative for dizziness and headaches.  Psychiatric/Behavioral:  Negative for depression and memory loss. The patient does not have insomnia.     Past Medical History:  Diagnosis Date   Cancer of skin of scalp    2023   Disorder of bone and cartilage, unspecified    Disorders of bursae and tendons in shoulder region, unspecified    Encounter for long-term (current) use of other medications    Esophageal reflux    Hyperosmolality and/or hypernatremia    Osteoporosis, unspecified    Other and unspecified hyperlipidemia    Pain in joint, shoulder region    Pain in limb    Palpitations    Pure hypercholesterolemia    Reflux esophagitis    Tachycardia, unspecified    Unspecified essential hypertension    Unspecified urinary incontinence    Past Surgical History:  Procedure Laterality Date   APPENDECTOMY  09/12/1953   Bone Density  08/14/2012   CATARACT EXTRACTION Bilateral 09/12/2008   CHOLECYSTECTOMY  09/12/2002   COLONOSCOPY  03/31/2009   Diverticulosis, Dr.John Dyane    DIAGNOSTIC MAMMOGRAM     LAPAROSCOPY N/A 02/15/2018   Procedure: LAPAROSCOPY  DIAGNOSTIC, LYSIS OF ADHESION;  Surgeon: Rubin Calamity, MD;  Location: [facility];  Service: General;  Laterality: N/A;   VARICOSE VEIN SURGERY Left    Summer 2022, Dr.Featherson ([facility])   Social History:   reports that she has never smoked. She has never used smokeless tobacco. She reports that she does not drink alcohol and does not use drugs.  Family History  Problem Relation Age  of Onset   Hypertension Mother    Diabetes Brother    Crohn's disease Neg Hx    Inflammatory bowel disease Neg Hx     Medications: Patient's Medications  New Prescriptions   DICLOFENAC  SODIUM (VOLTAREN ) 1 % GEL    Apply 4 g topically 4 (four) times daily.  Previous Medications   ACETAMINOPHEN  (TYLENOL ) 650 MG CR TABLET    Take 650 mg by mouth as needed.    ALENDRONATE  (FOSAMAX ) 70 MG TABLET    TAKE 1 TABLET (70 MG TOTAL) BY MOUTH EVERY 7 DAYS WITH FULL GLASS WATER ON EMPTY STOMACH   ASPIRIN EC 81 MG TABLET    Take 81 mg by mouth daily.   ATENOLOL  (TENORMIN ) 25 MG TABLET    TAKE 1 TABLET BY MOUTH EVERYDAY AT BEDTIME   CALCIUM -VITAMIN D (OSCAL WITH D) 500-5 MG-MCG TABLET    Take 1 tablet by mouth 2 (two) times daily.   CHOLECALCIFEROL (VITAMIN D) 2000 UNITS TABLET    Take 2,000 Units by mouth daily.    PANTOPRAZOLE  (PROTONIX ) 40 MG TABLET    TAKE 1 TABLET BY MOUTH EVERY DAY   SIMVASTATIN  (ZOCOR ) 40 MG TABLET    TAKE 1 TABLET BY MOUTH DAILY AT 6:00PM  Modified Medications   No medications on file  Discontinued Medications   BIOTIN 5000 MCG CAPS    Take by mouth daily.   CARBAMIDE PEROXIDE (DEBROX) 6.5 % OTIC SOLUTION    Place 5 drops into the left ear 2 (two) times daily.   FERROUS SULFATE (IRON) 325 (65 FE) MG TABS    Take 1 tablet by mouth as directed. On Monday's and Friday's   GUAIFENESIN -DEXTROMETHORPHAN (ROBITUSSIN DM) 100-10 MG/5ML SYRUP    Take 5 mLs by mouth every 4 (four) hours as needed for cough.    Physical Exam:  Vitals:   03/18/24 0815  BP: 111/75  Pulse: 66  Resp: 18  Temp: (!) 97.2 F (36.2 C)  SpO2: 98%  Weight: 153 lb 9.6 oz (69.7 kg)  Height: 4' 11 (1.499 m)   Body mass index is 31.02 kg/m. Wt Readings from Last 3 Encounters:  03/18/24 153 lb 9.6 oz (69.7 kg)  01/24/24 154 lb 6.4 oz (70 kg)  09/22/23 155 lb (70.3 kg)    Physical Exam Constitutional:      General: She is not in acute distress.    Appearance: She is well-developed. She is not  diaphoretic.  HENT:     Head: Normocephalic and atraumatic.     Mouth/Throat:     Pharynx: No oropharyngeal exudate.  Eyes:     Conjunctiva/sclera: Conjunctivae normal.     Pupils: Pupils are equal, round, and reactive to light.  Cardiovascular:     Rate and Rhythm: Normal rate and regular rhythm.     Heart sounds: Normal heart sounds.  Pulmonary:     Effort: Pulmonary effort is normal.     Breath sounds: Normal breath sounds.  Abdominal:     General: Bowel sounds are normal.     Palpations: Abdomen is soft.  Musculoskeletal:     Cervical back: Normal range of motion and neck supple.     Left knee: Crepitus present.     Right lower leg: No edema.     Left lower leg: No edema.  Skin:    General: Skin is warm and dry.  Neurological:     Mental Status: She is alert.  Psychiatric:        Mood and Affect: Mood normal.     Labs reviewed: Basic Metabolic Panel: Recent Labs    09/11/23 0802 03/13/24 0803  NA 141 141  K 4.3 4.4  CL 108 106  CO2 29 31  GLUCOSE 87 84  BUN 13 14  CREATININE 0.70 0.65  CALCIUM  10.1 10.2   Liver Function Tests: Recent Labs    09/11/23 0802 03/13/24 0803  AST 16 13  ALT 12 9  BILITOT 0.6 0.5  PROT 6.1 6.5   No results for input(s): LIPASE, AMYLASE in the last 8760 hours. No results for input(s): AMMONIA in the last 8760 hours. CBC: Recent Labs    09/11/23 0802 03/13/24 0803  WBC 5.2 5.9  NEUTROABS 3,468 3,947  HGB 12.6 13.1  HCT 37.7 40.7  MCV 93.8 95.5  PLT 247 268   Lipid Panel: Recent Labs    03/13/24 0803  CHOL 195  HDL 62  LDLCALC 108*  TRIG 136  CHOLHDL 3.1   TSH: No results for input(s): TSH in the last 8760 hours. A1C: No results found for: HGBA1C   Assessment/Plan  Iron deficiency anemia, unspecified iron deficiency anemia type Assessment & Plan: Stop iron as hgb as been stable.  Will monitor.   Orders: -     CBC with Differential/Platelet; Future  Benign hypertensive heart disease  without heart failure Assessment & Plan: Blood pressure well controlled, goal bp <140/90 Continue current medications and dietary modifications follow metabolic panel  Orders: -     Comprehensive metabolic panel with GFR; Future -     CBC with Differential/Platelet; Future  Gastroesophageal reflux disease without esophagitis Assessment & Plan: Well controlled on protonix .    Age-related osteoporosis without current pathological fracture Assessment & Plan: Has been on fosamax  since 2023- continue for 5 years continue to take calcium  600 mg twice daily with Vitamin D 2000 units daily and weight bearing activity 30 mins/5 days a week   Hyperlipidemia with target LDL less than 130 Assessment & Plan: Continues on zocor  with dietary modifications  Orders: -     Lipid panel; Future  Primary osteoarthritis of left knee Assessment & Plan: Can use tylenol  1000 mg by mouth every 8 hours as needed Discussed exercises to help with pain Voltaren  gel QID PRN  Orders: -     Diclofenac  Sodium; Apply 4 g topically 4 (four) times daily.  Dispense: 150 g; Refill: 2     Return in about 6 months (around 09/18/2024) for routine follow up, labs prior to visit.  Hurschel Paynter K. Caro BODILY [facility] [phone]

## 2024-03-18 NOTE — Assessment & Plan Note (Signed)
 Can use tylenol  1000 mg by mouth every 8 hours as needed Discussed exercises to help with pain Voltaren  gel QID PRN

## 2024-03-18 NOTE — Assessment & Plan Note (Signed)
 Blood pressure well controlled, goal bp <140/90 Continue current medications and dietary modifications follow metabolic panel

## 2024-03-18 NOTE — Assessment & Plan Note (Signed)
 Continues on zocor  with dietary modifications

## 2024-03-18 NOTE — Assessment & Plan Note (Signed)
Well controlled on protonix.

## 2024-04-04 ENCOUNTER — Other Ambulatory Visit: Payer: Self-pay | Admitting: Nurse Practitioner

## 2024-04-04 DIAGNOSIS — M810 Age-related osteoporosis without current pathological fracture: Secondary | ICD-10-CM

## 2024-04-10 ENCOUNTER — Other Ambulatory Visit: Payer: Self-pay | Admitting: Nurse Practitioner

## 2024-04-10 DIAGNOSIS — K219 Gastro-esophageal reflux disease without esophagitis: Secondary | ICD-10-CM

## 2024-05-04 ENCOUNTER — Other Ambulatory Visit: Payer: Self-pay | Admitting: Nurse Practitioner

## 2024-05-04 DIAGNOSIS — E785 Hyperlipidemia, unspecified: Secondary | ICD-10-CM

## 2024-06-18 LAB — HM MAMMOGRAPHY

## 2024-06-20 ENCOUNTER — Encounter: Payer: Self-pay | Admitting: Nurse Practitioner

## 2024-07-02 ENCOUNTER — Ambulatory Visit (INDEPENDENT_AMBULATORY_CARE_PROVIDER_SITE_OTHER)

## 2024-07-02 DIAGNOSIS — Z23 Encounter for immunization: Secondary | ICD-10-CM | POA: Diagnosis not present

## 2024-08-03 ENCOUNTER — Other Ambulatory Visit: Payer: Self-pay | Admitting: Nurse Practitioner

## 2024-08-03 DIAGNOSIS — I119 Hypertensive heart disease without heart failure: Secondary | ICD-10-CM

## 2024-09-16 ENCOUNTER — Other Ambulatory Visit: Payer: Self-pay

## 2024-09-16 DIAGNOSIS — D509 Iron deficiency anemia, unspecified: Secondary | ICD-10-CM

## 2024-09-16 DIAGNOSIS — E785 Hyperlipidemia, unspecified: Secondary | ICD-10-CM

## 2024-09-16 DIAGNOSIS — I119 Hypertensive heart disease without heart failure: Secondary | ICD-10-CM

## 2024-09-17 ENCOUNTER — Ambulatory Visit: Payer: Self-pay | Admitting: Nurse Practitioner

## 2024-09-17 LAB — CBC WITH DIFFERENTIAL/PLATELET
Absolute Lymphocytes: 1293 {cells}/uL (ref 850–3900)
Absolute Monocytes: 341 {cells}/uL (ref 200–950)
Basophils Absolute: 22 {cells}/uL (ref 0–200)
Basophils Relative: 0.4 %
Eosinophils Absolute: 121 {cells}/uL (ref 15–500)
Eosinophils Relative: 2.2 %
HCT: 37.8 % (ref 35.9–46.0)
Hemoglobin: 12.5 g/dL (ref 11.7–15.5)
MCH: 31.3 pg (ref 27.0–33.0)
MCHC: 33.1 g/dL (ref 31.6–35.4)
MCV: 94.7 fL (ref 81.4–101.7)
MPV: 9.3 fL (ref 7.5–12.5)
Monocytes Relative: 6.2 %
Neutro Abs: 3724 {cells}/uL (ref 1500–7800)
Neutrophils Relative %: 67.7 %
Platelets: 277 Thousand/uL (ref 140–400)
RBC: 3.99 Million/uL (ref 3.80–5.10)
RDW: 12.1 % (ref 11.0–15.0)
Total Lymphocyte: 23.5 %
WBC: 5.5 Thousand/uL (ref 3.8–10.8)

## 2024-09-17 LAB — COMPREHENSIVE METABOLIC PANEL WITH GFR
AG Ratio: 2 (calc) (ref 1.0–2.5)
ALT: 11 U/L (ref 6–29)
AST: 15 U/L (ref 10–35)
Albumin: 4 g/dL (ref 3.6–5.1)
Alkaline phosphatase (APISO): 52 U/L (ref 37–153)
BUN: 13 mg/dL (ref 7–25)
CO2: 29 mmol/L (ref 20–32)
Calcium: 9.9 mg/dL (ref 8.6–10.4)
Chloride: 107 mmol/L (ref 98–110)
Creat: 0.65 mg/dL (ref 0.60–0.95)
Globulin: 2 g/dL (ref 1.9–3.7)
Glucose, Bld: 81 mg/dL (ref 65–99)
Potassium: 4.2 mmol/L (ref 3.5–5.3)
Sodium: 142 mmol/L (ref 135–146)
Total Bilirubin: 0.5 mg/dL (ref 0.2–1.2)
Total Protein: 6 g/dL — ABNORMAL LOW (ref 6.1–8.1)
eGFR: 86 mL/min/1.73m2

## 2024-09-17 LAB — LIPID PANEL
Cholesterol: 158 mg/dL
HDL: 58 mg/dL
LDL Cholesterol (Calc): 76 mg/dL
Non-HDL Cholesterol (Calc): 100 mg/dL
Total CHOL/HDL Ratio: 2.7 (calc)
Triglycerides: 141 mg/dL

## 2024-09-20 ENCOUNTER — Encounter: Payer: Self-pay | Admitting: Nurse Practitioner

## 2024-09-20 ENCOUNTER — Ambulatory Visit: Payer: Self-pay | Admitting: Nurse Practitioner

## 2024-09-20 ENCOUNTER — Telehealth: Payer: Self-pay | Admitting: Nurse Practitioner

## 2024-09-20 VITALS — BP 130/80 | HR 68 | Temp 97.1°F | Ht 59.0 in | Wt 152.4 lb

## 2024-09-20 DIAGNOSIS — E785 Hyperlipidemia, unspecified: Secondary | ICD-10-CM | POA: Diagnosis not present

## 2024-09-20 DIAGNOSIS — K219 Gastro-esophageal reflux disease without esophagitis: Secondary | ICD-10-CM

## 2024-09-20 DIAGNOSIS — D509 Iron deficiency anemia, unspecified: Secondary | ICD-10-CM | POA: Diagnosis not present

## 2024-09-20 DIAGNOSIS — M545 Low back pain, unspecified: Secondary | ICD-10-CM | POA: Diagnosis not present

## 2024-09-20 DIAGNOSIS — M1712 Unilateral primary osteoarthritis, left knee: Secondary | ICD-10-CM | POA: Diagnosis not present

## 2024-09-20 DIAGNOSIS — M810 Age-related osteoporosis without current pathological fracture: Secondary | ICD-10-CM

## 2024-09-20 NOTE — Patient Instructions (Signed)
 1.) Visit your local pharmacy if you would like to receive covid boosters in the future

## 2024-09-20 NOTE — Telephone Encounter (Signed)
 Copied from CRM [id number]. Topic: Appointments - Scheduling Inquiry for Clinic >> Sep 20, 2024  1:50 PM Graeme ORN wrote: Reason for CRM: Patient called. Wanted to leave a message for Harlene to call her. She is update no one told her about the appt for Monday and she does not know why she has appt Monday if she was just seen today. Let her know it was made last year and its a different appt that may be able to be done over the phone. She states she does not do phone appts. She will be there Monday but wanted someone to know. Thank you

## 2024-09-20 NOTE — Progress Notes (Signed)
 "   Careteam: Patient Care Team: Caro Harlene POUR, NP as PCP - General (Geriatric Medicine) Netta Isadora KATHEE RIGGERS (Physician Assistant) Octavia Charleston, MD as Consulting Physician (Ophthalmology) Ethyl Lonni BRAVO, MD (Inactive) as Consulting Physician (Otolaryngology)  PLACE OF SERVICE:  [facility]  Advanced Directive information    Allergies[1]  Chief Complaint  Patient presents with   Medical Management of Chronic Issues    6 month follow-up. Discussed need for covid booster (declined)     HPI:  Discussed the use of AI scribe software for clinical note transcription with the patient, who gave verbal consent to proceed.  History of Present Illness Jobina Maita is an 84 year old female who presents for a six-month follow-up.  She has been experiencing low back pain for one to two weeks, which began after helping her husband on the farm, specifically when opening a gate. The pain radiates around her low back and is exacerbated by certain movements, reaching a pain level of six out of ten. Sitting alleviates the pain, and she has stopped taking Tylenol  as it was not effective. Resting and avoiding aggravating activities have provided some relief. No pain radiating down the leg from the back.  She has a history of anemia but blood counts stable  She continues to take Zocor  40 mg daily for cholesterol.   She has been on Fosamax  for osteoporosis for approximately three years and continues to take calcium  and vitamin D supplements.   She is also on Protonix  for acid reflux, which she reports is effective.  She has osteoarthritis of the knee and previously used Voltaren  gel, which she found ineffective. She occasionally uses Tylenol  for knee pain, which is worsening and affecting her mobility.    Review of Systems:  Review of Systems  Constitutional:  Negative for chills, fever and weight loss.  HENT:  Negative for tinnitus.   Respiratory:  Negative for cough, sputum  production and shortness of breath.   Cardiovascular:  Negative for chest pain, palpitations and leg swelling.  Gastrointestinal:  Negative for abdominal pain, constipation, diarrhea and heartburn.  Genitourinary:  Negative for dysuria, frequency and urgency.  Musculoskeletal:  Positive for joint pain and myalgias. Negative for back pain and falls.  Skin: Negative.   Neurological:  Negative for dizziness and headaches.  Psychiatric/Behavioral:  Negative for depression and memory loss. The patient does not have insomnia.     Past Medical History:  Diagnosis Date   Cancer of skin of scalp    2023   Disorder of bone and cartilage, unspecified    Disorders of bursae and tendons in shoulder region, unspecified    Encounter for long-term (current) use of other medications    Esophageal reflux    Hyperosmolality and/or hypernatremia    Osteoporosis, unspecified    Other and unspecified hyperlipidemia    Pain in joint, shoulder region    Pain in limb    Palpitations    Pure hypercholesterolemia    Reflux esophagitis    Tachycardia, unspecified    Unspecified essential hypertension    Unspecified urinary incontinence    Past Surgical History:  Procedure Laterality Date   APPENDECTOMY  09/12/1953   Bone Density  08/14/2012   CATARACT EXTRACTION Bilateral 09/12/2008   CHOLECYSTECTOMY  09/12/2002   COLONOSCOPY  03/31/2009   Diverticulosis, Dr.John Dyane    DIAGNOSTIC MAMMOGRAM     LAPAROSCOPY N/A 02/15/2018   Procedure: LAPAROSCOPY DIAGNOSTIC, LYSIS OF ADHESION;  Surgeon: Rubin Calamity, MD;  Location: [facility];  Service: General;  Laterality: N/A;   VARICOSE VEIN SURGERY Left    Summer 2022, Dr.Featherson ([facility])   Social History:   reports that she has never smoked. She has never used smokeless tobacco. She reports that she does not drink alcohol and does not use drugs.  Family History  Problem Relation Age of Onset   Hypertension Mother    Diabetes Brother    Crohn's  disease Neg Hx    Inflammatory bowel disease Neg Hx     Medications: Patient's Medications  New Prescriptions   No medications on file  Previous Medications   ACETAMINOPHEN  (TYLENOL ) 650 MG CR TABLET    Take 650 mg by mouth as needed.    ALENDRONATE  (FOSAMAX ) 70 MG TABLET    TAKE 1 TABLET (70 MG TOTAL) BY MOUTH EVERY 7 DAYS WITH FULL GLASS WATER ON EMPTY STOMACH   ASPIRIN EC 81 MG TABLET    Take 81 mg by mouth daily.   ATENOLOL  (TENORMIN ) 25 MG TABLET    TAKE 1 TABLET BY MOUTH EVERYDAY AT BEDTIME   CALCIUM -VITAMIN D (OSCAL WITH D) 500-5 MG-MCG TABLET    Take 1 tablet by mouth 2 (two) times daily.   CHOLECALCIFEROL (VITAMIN D) 2000 UNITS TABLET    Take 2,000 Units by mouth daily.    DICLOFENAC  SODIUM (VOLTAREN ) 1 % GEL    Apply 4 g topically 4 (four) times daily.   PANTOPRAZOLE  (PROTONIX ) 40 MG TABLET    TAKE 1 TABLET BY MOUTH EVERY DAY   SIMVASTATIN  (ZOCOR ) 40 MG TABLET    TAKE 1 TABLET BY MOUTH DAILY AT 6:00PM  Modified Medications   No medications on file  Discontinued Medications   No medications on file    Physical Exam:  Vitals:   09/20/24 0803  BP: 130/80  Pulse: 68  Temp: (!) 97.1 F (36.2 C)  SpO2: 95%  Weight: 152 lb 6.4 oz (69.1 kg)  Height: 4' 11 (1.499 m)   Body mass index is 30.78 kg/m. Wt Readings from Last 3 Encounters:  09/20/24 152 lb 6.4 oz (69.1 kg)  03/18/24 153 lb 9.6 oz (69.7 kg)  01/24/24 154 lb 6.4 oz (70 kg)    Physical Exam Constitutional:      General: She is not in acute distress.    Appearance: She is well-developed. She is not diaphoretic.  HENT:     Head: Normocephalic and atraumatic.     Mouth/Throat:     Pharynx: No oropharyngeal exudate.  Eyes:     Conjunctiva/sclera: Conjunctivae normal.     Pupils: Pupils are equal, round, and reactive to light.  Cardiovascular:     Rate and Rhythm: Normal rate and regular rhythm.     Heart sounds: Normal heart sounds.  Pulmonary:     Effort: Pulmonary effort is normal.     Breath  sounds: Normal breath sounds.  Abdominal:     General: Bowel sounds are normal.     Palpations: Abdomen is soft.  Musculoskeletal:     Cervical back: Normal range of motion and neck supple.     Right lower leg: No edema.     Left lower leg: No edema.  Skin:    General: Skin is warm and dry.  Neurological:     Mental Status: She is alert.  Psychiatric:        Mood and Affect: Mood normal.     Labs reviewed: Basic Metabolic Panel: Recent Labs    03/13/24 0803 09/16/24  0810  NA 141 142  K 4.4 4.2  CL 106 107  CO2 31 29  GLUCOSE 84 81  BUN 14 13  CREATININE 0.65 0.65  CALCIUM  10.2 9.9   Liver Function Tests: Recent Labs    03/13/24 0803 09/16/24 0810  AST 13 15  ALT 9 11  BILITOT 0.5 0.5  PROT 6.5 6.0*   No results for input(s): LIPASE, AMYLASE in the last 8760 hours. No results for input(s): AMMONIA in the last 8760 hours. CBC: Recent Labs    03/13/24 0803 09/16/24 0810  WBC 5.9 5.5  NEUTROABS 3,947 3,724  HGB 13.1 12.5  HCT 40.7 37.8  MCV 95.5 94.7  PLT 268 277   Lipid Panel: Recent Labs    03/13/24 0803 09/16/24 0810  CHOL 195 158  HDL 62 58  LDLCALC 108* 76  TRIG 136 141  CHOLHDL 3.1 2.7   TSH: No results for input(s): TSH in the last 8760 hours. A1C: No results found for: HGBA1C   Assessment/Plan Assessment and Plan Assessment & Plan Primary osteoarthritis of left knee Worsening knee pain affecting mobility. Voltaren  gel was ineffective. - Referred to orthopedics for evaluation and potential injection.  Acute low back pain Likely due to physical strain. Pain intermittent, 6/10 with certain movements, absent when sitting. - Apply heating pad three times a day. - Rest and avoid aggravating activities.- which seems to be improving symptoms -tylenol  was not effective - Contact provider if pain worsens.  Hyperlipidemia LDL at 76 mg/dL. - Continue Zocor  40 mg daily.  Gastroesophageal reflux disease - Continue Protonix  as  prescribed.  Age-related osteoporosis Managed with Fosamax  since 2023, to continue for two more years (stop 2028) - Continue Fosamax  once weekly. - Continue calcium  and vitamin D supplementation.     Return in about 6 months (around 03/20/2025) for routine follow up, labs prior to visit. Shamyia Grandpre K. Caro BODILY [facility] [phone]      [1]  Allergies Allergen Reactions   Shellfish Allergy Nausea And Vomiting   "

## 2024-09-23 ENCOUNTER — Encounter: Payer: Self-pay | Admitting: Nurse Practitioner

## 2024-09-23 ENCOUNTER — Ambulatory Visit: Payer: Medicare Other | Admitting: Nurse Practitioner

## 2024-09-23 VITALS — BP 118/76 | HR 58 | Temp 97.2°F | Ht 58.66 in | Wt 153.4 lb

## 2024-09-23 DIAGNOSIS — Z0000 Encounter for general adult medical examination without abnormal findings: Secondary | ICD-10-CM | POA: Diagnosis not present

## 2024-09-23 NOTE — Progress Notes (Signed)
 "  Chief Complaint  Patient presents with   Medicare Wellness    Annual wellness visit      Subjective:   Brooke Cox is a 84 y.o. female who presents for a Medicare Annual Wellness Visit.  Visit info / Clinical Intake: Medicare Wellness Visit Type:: Subsequent Annual Wellness Visit Persons participating in visit and providing information:: patient Medicare Wellness Visit Mode:: In-person (required for WTM) Interpreter Needed?: No Pre-visit prep was completed: no AWV questionnaire completed by patient prior to visit?: no Living arrangements:: lives with spouse/significant other Patient's Overall Health Status Rating: very good Typical amount of pain: some Does pain affect daily life?: no Are you currently prescribed opioids?: no  Dietary Habits and Nutritional Risks How many meals a day?: 2 Eats fruit and vegetables daily?: yes Most meals are obtained by: preparing own meals; eating out In the last 2 weeks, have you had any of the following?: none Diabetic:: no  Functional Status Activities of Daily Living (to include ambulation/medication): Independent Ambulation: Independent Medication Administration: Independent Home Management (perform basic housework or laundry): Independent Manage your own finances?: yes Primary transportation is: driving Concerns about vision?: no *vision screening is required for WTM* Concerns about hearing?: no  Fall Screening Falls in the past year?: 0 Number of falls in past year: 0 Was there an injury with Fall?: 0 Fall Risk Category Calculator: 0 Patient Fall Risk Level: Low Fall Risk  Fall Risk Patient at Risk for Falls Due to: No Fall Risks Fall risk Follow up: Falls evaluation completed  Home and Transportation Safety: All rugs have non-skid backing?: yes All stairs or steps have railings?: yes Grab bars in the bathtub or shower?: yes Have non-skid surface in bathtub or shower?: yes Good home lighting?: yes Regular seat belt  use?: yes Hospital stays in the last year:: no  Cognitive Assessment Difficulty concentrating, remembering, or making decisions? : yes Will 6CIT or Mini Cog be Completed: yes What year is it?: 0 points What month is it?: 0 points Give patient an address phrase to remember (5 components): [address] [place]  About what time is it?: 0 points Count backwards from 20 to 1: 0 points Say the months of the year in reverse: 0 points Repeat the address phrase from earlier: 0 points 6 CIT Score: 0 points  Advance Directives (For Healthcare) Does Patient Have a Medical Advance Directive?: Yes Does patient want to make changes to medical advance directive?: No - Guardian declined Type of Advance Directive: Out of facility DNR (pink MOST or yellow form) Out of facility DNR (pink MOST or yellow form) in Chart? (Ambulatory ONLY): Yes - validated most recent copy scanned in chart Pre-existing out of facility DNR order (yellow form or pink MOST form): Yellow form placed in chart (order not valid for inpatient use); Pink MOST form placed in chart (order not valid for inpatient use)  Reviewed/Updated  Reviewed/Updated: Reviewed All (Medical, Surgical, Family, Medications, Allergies, Care Teams, Patient Goals)    Allergies (verified) Shellfish allergy   Current Medications (verified) Outpatient Encounter Medications as of 09/23/2024  Medication Sig   acetaminophen  (TYLENOL ) 650 MG CR tablet Take 650 mg by mouth as needed.    alendronate  (FOSAMAX ) 70 MG tablet TAKE 1 TABLET (70 MG TOTAL) BY MOUTH EVERY 7 DAYS WITH FULL GLASS WATER ON EMPTY STOMACH   aspirin EC 81 MG tablet Take 81 mg by mouth daily.   atenolol  (TENORMIN ) 25 MG tablet TAKE 1 TABLET BY MOUTH EVERYDAY AT BEDTIME  calcium -vitamin D (OSCAL WITH D) 500-5 MG-MCG tablet Take 1 tablet by mouth 2 (two) times daily.   Cholecalciferol (VITAMIN D) 2000 UNITS tablet Take 2,000 Units by mouth daily.    pantoprazole  (PROTONIX ) 40 MG  tablet TAKE 1 TABLET BY MOUTH EVERY DAY   simvastatin  (ZOCOR ) 40 MG tablet TAKE 1 TABLET BY MOUTH DAILY AT 6:00PM   diclofenac  Sodium (VOLTAREN ) 1 % GEL Apply 4 g topically 4 (four) times daily. (Patient not taking: Reported on 09/23/2024)   No facility-administered encounter medications on file as of 09/23/2024.    History: Past Medical History:  Diagnosis Date   Cancer of skin of scalp    2023   Disorder of bone and cartilage, unspecified    Disorders of bursae and tendons in shoulder region, unspecified    Encounter for long-term (current) use of other medications    Esophageal reflux    Hyperosmolality and/or hypernatremia    Osteoporosis, unspecified    Other and unspecified hyperlipidemia    Pain in joint, shoulder region    Pain in limb    Palpitations    Pure hypercholesterolemia    Reflux esophagitis    Tachycardia, unspecified    Unspecified essential hypertension    Unspecified urinary incontinence    Past Surgical History:  Procedure Laterality Date   APPENDECTOMY  09/12/1953   Bone Density  08/14/2012   CATARACT EXTRACTION Bilateral 09/12/2008   CHOLECYSTECTOMY  09/12/2002   COLONOSCOPY  03/31/2009   Diverticulosis, Dr.John Dyane    DIAGNOSTIC MAMMOGRAM     LAPAROSCOPY N/A 02/15/2018   Procedure: LAPAROSCOPY DIAGNOSTIC, LYSIS OF ADHESION;  Surgeon: Rubin Calamity, MD;  Location: [facility];  Service: General;  Laterality: N/A;   VARICOSE VEIN SURGERY Left    Summer 2022, Dr.Featherson ([facility])   Family History  Problem Relation Age of Onset   Hypertension Mother    Diabetes Brother    Crohn's disease Neg Hx    Inflammatory bowel disease Neg Hx    Social History   Occupational History   Occupation: retired  Tobacco Use   Smoking status: Never   Smokeless tobacco: Never  Vaping Use   Vaping status: Never Used  Substance and Sexual Activity   Alcohol use: No   Drug use: No   Sexual activity: Not Currently   Tobacco Counseling Counseling  given: Not Answered  SDOH Screenings   Depression (PHQ2-9): Low Risk (09/23/2024)  Tobacco Use: Low Risk (09/23/2024)   See flowsheets for full screening details  Depression Screen PHQ 2 & 9 Depression Scale- Over the past 2 weeks, how often have you been bothered by any of the following problems? Little interest or pleasure in doing things: 0 Feeling down, depressed, or hopeless (PHQ Adolescent also includes...irritable): 0 PHQ-2 Total Score: 0     Goals Addressed   None          Objective:    Today's Vitals   09/23/24 0859  BP: 118/76  Pulse: (!) 58  Temp: (!) 97.2 F (36.2 C)  SpO2: 99%  Weight: 153 lb 6.4 oz (69.6 kg)  Height: 4' 10.66 (1.49 m)   Body mass index is 31.34 kg/m.  Hearing/Vision screen Hearing Screening - Comments:: No hearing issues  Vision Screening - Comments:: Last eye exam less than 12 months ago, next eye exam pending for March/April 2026, with Dr.Groat Immunizations and Health Maintenance Health Maintenance  Topic Date Due   COVID-19 Vaccine (5 - 2025-26 season) 10/06/2024 (Originally 05/13/2024)   Mammogram  06/18/2025   Medicare Annual Wellness (AWV)  09/23/2025   DTaP/Tdap/Td (3 - Td or Tdap) 11/05/2032   Pneumococcal Vaccine: 50+ Years  Completed   Influenza Vaccine  Completed   Bone Density Scan  Completed   Zoster Vaccines- Shingrix  Completed   Meningococcal B Vaccine  Aged Out        Assessment/Plan:  This is a routine wellness examination for Brooke Cox.  Patient Care Team: Caro Harlene POUR, NP as PCP - General (Geriatric Medicine) Netta Isadora KATHEE RIGGERS (Physician Assistant) Octavia Charleston, MD as Consulting Physician (Ophthalmology) Ethyl Lonni BRAVO, MD (Inactive) as Consulting Physician (Otolaryngology)  I have personally reviewed and noted the following in the patients chart:   Medical and social history Use of alcohol, tobacco or illicit drugs  Current medications and supplements including opioid  prescriptions. Functional ability and status Nutritional status Physical activity Advanced directives List of other physicians Hospitalizations, surgeries, and ER visits in previous 12 months Vitals Screenings to include cognitive, depression, and falls Referrals and appointments  No orders of the defined types were placed in this encounter.  In addition, I have reviewed and discussed with patient certain preventive protocols, quality metrics, and best practice recommendations. A written personalized care plan for preventive services as well as general preventive health recommendations were provided to patient.   Harlene POUR Caro, NP   09/23/2024   Return in 1 year (on 09/23/2025) for AWV.  After Visit Summary: (In Person-Printed) AVS printed and given to the patient  "

## 2024-09-23 NOTE — Patient Instructions (Signed)
 Brooke Cox,  Thank you for taking the time for your Medicare Wellness Visit. I appreciate your continued commitment to your health goals. Please review the care plan we discussed, and feel free to reach out if I can assist you further.  Please note that Annual Wellness Visits do not include a physical exam. Some assessments may be limited, especially if the visit was conducted virtually. If needed, we may recommend an in-person follow-up with your provider.  Ongoing Care Seeing your primary care provider every 3 to 6 months helps us  monitor your health and provide consistent, personalized care.   Referrals If a referral was made during today's visit and you haven't received any updates within two weeks, please contact the referred provider directly to check on the status.  Recommended Screenings:  Health Maintenance  Topic Date Due   Medicare Annual Wellness Visit  09/21/2024   COVID-19 Vaccine (5 - 2025-26 season) 10/06/2024*   Breast Cancer Screening  06/18/2025   DTaP/Tdap/Td vaccine (3 - Td or Tdap) 11/05/2032   Pneumococcal Vaccine for age over 23  Completed   Flu Shot  Completed   Osteoporosis screening with Bone Density Scan  Completed   Zoster (Shingles) Vaccine  Completed   Meningitis B Vaccine  Aged Out  *Topic was postponed. The date shown is not the original due date.       09/23/2024    9:01 AM  Advanced Directives  Does Patient Have a Medical Advance Directive? Yes  Type of Advance Directive Out of facility DNR (pink MOST or yellow form)  Does patient want to make changes to medical advance directive? No - Guardian declined  Pre-existing out of facility DNR order (yellow form or pink MOST form) Yellow form placed in chart (order not valid for inpatient use);Pink MOST form placed in chart (order not valid for inpatient use)    Vision: Annual vision screenings are recommended for early detection of glaucoma, cataracts, and diabetic retinopathy. These exams can also  reveal signs of chronic conditions such as diabetes and high blood pressure.  Dental: Annual dental screenings help detect early signs of oral cancer, gum disease, and other conditions linked to overall health, including heart disease and diabetes.  Please see the attached documents for additional preventive care recommendations.

## 2024-09-23 NOTE — Telephone Encounter (Signed)
 She is having her AWV

## 2024-10-01 ENCOUNTER — Other Ambulatory Visit: Payer: Self-pay | Admitting: Nurse Practitioner

## 2024-10-01 DIAGNOSIS — K219 Gastro-esophageal reflux disease without esophagitis: Secondary | ICD-10-CM

## 2024-10-16 ENCOUNTER — Ambulatory Visit: Admitting: Orthopaedic Surgery

## 2024-10-16 ENCOUNTER — Other Ambulatory Visit

## 2024-10-16 DIAGNOSIS — M25562 Pain in left knee: Secondary | ICD-10-CM | POA: Diagnosis not present

## 2024-10-16 DIAGNOSIS — G8929 Other chronic pain: Secondary | ICD-10-CM | POA: Diagnosis not present

## 2024-10-16 MED ORDER — METHYLPREDNISOLONE ACETATE 40 MG/ML IJ SUSP
40.0000 mg | INTRAMUSCULAR | Status: AC | PRN
  Administered 2024-10-16: 40 mg via INTRA_ARTICULAR

## 2024-10-16 MED ORDER — LIDOCAINE HCL 1 % IJ SOLN
3.0000 mL | INTRAMUSCULAR | Status: AC | PRN
  Administered 2024-10-16: 3 mL

## 2024-10-16 NOTE — Progress Notes (Signed)
 The patient is an active 84 year old female who comes in for evaluation treatment of left knee pain mostly in the back of her knee this been going on for about 6 months now with no known injury.  She also points to the medial aspect of her knee as the source of her pain.  She has never had injections or surgery on the knee.  She denies any type of injury.  She is taking care of her husband who is dealing with cancer.  She does not walk with assist device.  She is a younger and healthy appearing 16.  I did review her medications and past medical history within epic.  She is not a diabetic and not on blood thinning medication.  Exam nation of her left knee shows some pain in the posterior aspect of the knee as well as medial.  There is varus malalignment the knee and pain throughout the arc of motion with the knee.  She does walk with a slight limp when she first gets up but is able to walk about half.  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  She needs us  to stay conservative with her given the fact that she is going to need to take care of her husband for a while.  Since she has not had any type of injection before I recommended this for her as well as quad strengthening exercises.  I did place a steroid injection in her left knee today which she tolerated well.  She knows to wait at least 3 to 4 months between injections.  Follow-up for now is as needed.    Procedure Note  Patient: Brooke Cox             Date of Birth: Jan 16, 1941           MRN: [id number]             Visit Date: 10/16/2024  Procedures: Visit Diagnoses:  1. Chronic pain of left knee     Large Joint Inj: L knee on 10/16/2024 11:11 AM Indications: diagnostic evaluation and pain Details: 22 G 1.5 in needle, superolateral approach  Arthrogram: No  Medications: 3 mL lidocaine  1 %; 40 mg methylPREDNISolone  acetate  40 MG/ML Outcome: tolerated well, no immediate complications Procedure, treatment alternatives, risks and benefits explained, specific risks discussed. Consent was given by the patient. Immediately prior to procedure a time out was called to verify the correct patient, procedure, equipment, support staff and site/side marked as required. Patient was prepped and draped in the usual sterile fashion.

## 2025-03-31 ENCOUNTER — Other Ambulatory Visit

## 2025-04-04 ENCOUNTER — Ambulatory Visit: Admitting: Nurse Practitioner

## 2025-09-26 ENCOUNTER — Ambulatory Visit: Admitting: Nurse Practitioner
